# Patient Record
Sex: Male | Born: 1975
Health system: Southern US, Community
[De-identification: ages and names within clinical notes are randomized; demographics above are authoritative.]

## PROBLEM LIST (undated history)

## (undated) DIAGNOSIS — E785 Hyperlipidemia, unspecified: Secondary | ICD-10-CM

## (undated) DIAGNOSIS — T7840XA Allergy, unspecified, initial encounter: Secondary | ICD-10-CM

## (undated) DIAGNOSIS — I10 Essential (primary) hypertension: Secondary | ICD-10-CM

## (undated) DIAGNOSIS — G709 Myoneural disorder, unspecified: Secondary | ICD-10-CM

## (undated) DIAGNOSIS — F32A Depression, unspecified: Secondary | ICD-10-CM

## (undated) DIAGNOSIS — I639 Cerebral infarction, unspecified: Secondary | ICD-10-CM

## (undated) HISTORY — DX: Cerebral infarction, unspecified: I63.9

## (undated) HISTORY — DX: Allergy, unspecified, initial encounter: T78.40XA

## (undated) HISTORY — DX: Essential (primary) hypertension: I10

## (undated) HISTORY — DX: Hyperlipidemia, unspecified: E78.5

## (undated) HISTORY — DX: Myoneural disorder, unspecified: G70.9

---

## 2014-03-01 DIAGNOSIS — M7551 Bursitis of right shoulder: Secondary | ICD-10-CM | POA: Insufficient documentation

## 2014-07-29 DIAGNOSIS — G2589 Other specified extrapyramidal and movement disorders: Secondary | ICD-10-CM | POA: Insufficient documentation

## 2014-12-10 DIAGNOSIS — G8929 Other chronic pain: Secondary | ICD-10-CM | POA: Insufficient documentation

## 2014-12-10 DIAGNOSIS — M25511 Pain in right shoulder: Secondary | ICD-10-CM | POA: Insufficient documentation

## 2019-01-26 DIAGNOSIS — M76891 Other specified enthesopathies of right lower limb, excluding foot: Secondary | ICD-10-CM | POA: Insufficient documentation

## 2019-05-17 ENCOUNTER — Ambulatory Visit: Payer: 59 | Attending: Internal Medicine

## 2019-05-17 DIAGNOSIS — Z23 Encounter for immunization: Secondary | ICD-10-CM

## 2019-05-17 NOTE — Progress Notes (Signed)
   Covid-19 Vaccination Clinic  Name:  Lilbern Slacum    MRN: [id number] DOB: 12/20/1975  05/17/2019  Mr. Loadholt was observed post Covid-19 immunization for 15 minutes without incident. He was provided with Vaccine Information Sheet and instruction to access the V-Safe system.   Mr. Novick was instructed to call 911 with any severe reactions post vaccine: Marland Kitchen Difficulty breathing  . Swelling of face and throat  . A fast heartbeat  . A bad rash all over body  . Dizziness and weakness   Immunizations Administered    Name Date Dose VIS Date Route   Pfizer COVID-19 Vaccine 05/17/2019  8:42 AM 0.3 mL 01/30/2019 Intramuscular   Manufacturer: [place]   Lot: [id number]   [place]: [id number]

## 2019-06-10 ENCOUNTER — Ambulatory Visit: Payer: 59 | Attending: Internal Medicine

## 2019-06-10 DIAGNOSIS — Z23 Encounter for immunization: Secondary | ICD-10-CM

## 2019-06-10 NOTE — Progress Notes (Signed)
   Covid-19 Vaccination Clinic  Name:  Cody Henson    MRN: [id number] DOB: November 26, 1975  06/10/2019  Cody Henson was observed post Covid-19 immunization for 15 minutes without incident. He was provided with Vaccine Information Sheet and instruction to access the V-Safe system.   Cody Henson was instructed to call 911 with any severe reactions post vaccine: Marland Kitchen Difficulty breathing  . Swelling of face and throat  . A fast heartbeat  . A bad rash all over body  . Dizziness and weakness   Immunizations Administered    Name Date Dose VIS Date Route   Pfizer COVID-19 Vaccine 06/10/2019  8:19 AM 0.3 mL 04/15/2018 Intramuscular   Manufacturer: [place]   Lot: [id number]   [place]: [id number]

## 2020-03-16 ENCOUNTER — Emergency Department (HOSPITAL_COMMUNITY): Payer: 59

## 2020-03-16 ENCOUNTER — Encounter (HOSPITAL_COMMUNITY): Payer: Self-pay | Admitting: Emergency Medicine

## 2020-03-16 ENCOUNTER — Inpatient Hospital Stay (HOSPITAL_COMMUNITY)
Admission: EM | Admit: 2020-03-16 | Discharge: 2020-03-23 | DRG: 062 | Disposition: A | Discharge status: Rehabilitation Facility | Payer: 59 | Attending: Neurology | Admitting: Neurology

## 2020-03-16 DIAGNOSIS — R4701 Aphasia: Secondary | ICD-10-CM | POA: Diagnosis not present

## 2020-03-16 DIAGNOSIS — I63412 Cerebral infarction due to embolism of left middle cerebral artery: Secondary | ICD-10-CM | POA: Diagnosis not present

## 2020-03-16 DIAGNOSIS — F1290 Cannabis use, unspecified, uncomplicated: Secondary | ICD-10-CM | POA: Diagnosis present

## 2020-03-16 DIAGNOSIS — R471 Dysarthria and anarthria: Secondary | ICD-10-CM | POA: Diagnosis present

## 2020-03-16 DIAGNOSIS — I639 Cerebral infarction, unspecified: Secondary | ICD-10-CM

## 2020-03-16 DIAGNOSIS — Z79899 Other long term (current) drug therapy: Secondary | ICD-10-CM

## 2020-03-16 DIAGNOSIS — R739 Hyperglycemia, unspecified: Secondary | ICD-10-CM

## 2020-03-16 DIAGNOSIS — R482 Apraxia: Secondary | ICD-10-CM | POA: Diagnosis present

## 2020-03-16 DIAGNOSIS — R030 Elevated blood-pressure reading, without diagnosis of hypertension: Secondary | ICD-10-CM

## 2020-03-16 DIAGNOSIS — E785 Hyperlipidemia, unspecified: Secondary | ICD-10-CM

## 2020-03-16 DIAGNOSIS — G8191 Hemiplegia, unspecified affecting right dominant side: Secondary | ICD-10-CM

## 2020-03-16 DIAGNOSIS — Q211 Atrial septal defect: Secondary | ICD-10-CM

## 2020-03-16 DIAGNOSIS — H53461 Homonymous bilateral field defects, right side: Secondary | ICD-10-CM | POA: Diagnosis present

## 2020-03-16 DIAGNOSIS — R339 Retention of urine, unspecified: Secondary | ICD-10-CM | POA: Diagnosis present

## 2020-03-16 DIAGNOSIS — F32A Depression, unspecified: Secondary | ICD-10-CM | POA: Diagnosis present

## 2020-03-16 DIAGNOSIS — Z8673 Personal history of transient ischemic attack (TIA), and cerebral infarction without residual deficits: Secondary | ICD-10-CM

## 2020-03-16 DIAGNOSIS — R29810 Facial weakness: Secondary | ICD-10-CM | POA: Diagnosis present

## 2020-03-16 DIAGNOSIS — E876 Hypokalemia: Secondary | ICD-10-CM | POA: Diagnosis present

## 2020-03-16 DIAGNOSIS — R29719 NIHSS score 19: Secondary | ICD-10-CM | POA: Diagnosis present

## 2020-03-16 DIAGNOSIS — Z20822 Contact with and (suspected) exposure to covid-19: Secondary | ICD-10-CM | POA: Diagnosis present

## 2020-03-16 DIAGNOSIS — Z823 Family history of stroke: Secondary | ICD-10-CM

## 2020-03-16 DIAGNOSIS — I10 Essential (primary) hypertension: Secondary | ICD-10-CM

## 2020-03-16 HISTORY — DX: Depression, unspecified: F32.A

## 2020-03-16 LAB — DIFFERENTIAL
Abs Immature Granulocytes: 0.09 10*3/uL — ABNORMAL HIGH (ref 0.00–0.07)
Basophils Absolute: 0.1 10*3/uL (ref 0.0–0.1)
Basophils Relative: 0 %
Eosinophils Absolute: 0.1 10*3/uL (ref 0.0–0.5)
Eosinophils Relative: 1 %
Immature Granulocytes: 1 %
Lymphocytes Relative: 12 %
Lymphs Abs: 1.7 10*3/uL (ref 0.7–4.0)
Monocytes Absolute: 0.6 10*3/uL (ref 0.1–1.0)
Monocytes Relative: 4 %
Neutro Abs: 12.4 10*3/uL — ABNORMAL HIGH (ref 1.7–7.7)
Neutrophils Relative %: 82 %

## 2020-03-16 LAB — I-STAT CHEM 8, ED
BUN: 15 mg/dL (ref 6–20)
Calcium, Ion: 1.22 mmol/L (ref 1.15–1.40)
Chloride: 108 mmol/L (ref 98–111)
Creatinine, Ser: 0.9 mg/dL (ref 0.61–1.24)
Glucose, Bld: 109 mg/dL — ABNORMAL HIGH (ref 70–99)
HCT: 46 % (ref 39.0–52.0)
Hemoglobin: 15.6 g/dL (ref 13.0–17.0)
Potassium: 3.4 mmol/L — ABNORMAL LOW (ref 3.5–5.1)
Sodium: 142 mmol/L (ref 135–145)
TCO2: 23 mmol/L (ref 22–32)

## 2020-03-16 LAB — COMPREHENSIVE METABOLIC PANEL
ALT: 34 U/L (ref 0–44)
AST: 33 U/L (ref 15–41)
Albumin: 4.6 g/dL (ref 3.5–5.0)
Alkaline Phosphatase: 72 U/L (ref 38–126)
Anion gap: 14 (ref 5–15)
BUN: 14 mg/dL (ref 6–20)
CO2: 22 mmol/L (ref 22–32)
Calcium: 9.7 mg/dL (ref 8.9–10.3)
Chloride: 105 mmol/L (ref 98–111)
Creatinine, Ser: 1.08 mg/dL (ref 0.61–1.24)
GFR, Estimated: >60 mL/min (ref >60)
Glucose, Bld: 116 mg/dL — ABNORMAL HIGH (ref 70–99)
Potassium: 3.4 mmol/L — ABNORMAL LOW (ref 3.5–5.1)
Sodium: 141 mmol/L (ref 135–145)
Total Bilirubin: 0.6 mg/dL (ref 0.3–1.2)
Total Protein: 7.2 g/dL (ref 6.5–8.1)

## 2020-03-16 LAB — CBC
HCT: 44.6 % (ref 39.0–52.0)
Hemoglobin: 15.6 g/dL (ref 13.0–17.0)
MCH: 30.7 pg (ref 26.0–34.0)
MCHC: 35 g/dL (ref 30.0–36.0)
MCV: 87.8 fL (ref 80.0–100.0)
Platelets: 295 10*3/uL (ref 150–400)
RBC: 5.08 MIL/uL (ref 4.22–5.81)
RDW: 12.8 % (ref 11.5–15.5)
WBC: 15.1 10*3/uL — ABNORMAL HIGH (ref 4.0–10.5)
nRBC: 0 % (ref 0.0–0.2)

## 2020-03-16 LAB — APTT: aPTT: 28 seconds (ref 24–36)

## 2020-03-16 LAB — CBG MONITORING, ED: Glucose-Capillary: 113 mg/dL — ABNORMAL HIGH (ref 70–99)

## 2020-03-16 LAB — PROTIME-INR
INR: 1 (ref 0.8–1.2)
Prothrombin Time: 12.5 seconds (ref 11.4–15.2)

## 2020-03-16 MED ORDER — SODIUM CHLORIDE 0.9 % IV SOLN
50.0000 mL | Freq: Once | INTRAVENOUS | Status: AC
  Administered 2020-03-16: 50 mL via INTRAVENOUS

## 2020-03-16 MED ORDER — IOHEXOL 350 MG/ML SOLN
100.0000 mL | Freq: Once | INTRAVENOUS | Status: AC | PRN
  Administered 2020-03-16: 100 mL via INTRAVENOUS

## 2020-03-16 MED ORDER — SODIUM CHLORIDE 0.9% FLUSH
3.0000 mL | Freq: Once | INTRAVENOUS | Status: AC
  Administered 2020-03-16: 3 mL via INTRAVENOUS

## 2020-03-16 MED ORDER — ALTEPLASE (STROKE) FULL DOSE INFUSION
0.9000 mg/kg | Freq: Once | INTRAVENOUS | Status: AC
  Administered 2020-03-16: 67.6 mg via INTRAVENOUS
  Filled 2020-03-16: qty 67.6

## 2020-03-16 NOTE — H&P (Incomplete)
Referring Physician: Dr. Laverta Baltimore    Chief Complaint: Acute onset of dense expressive and receptive aphasia with right sided weakness  HPI: Cody Henson is an 46 y.o. male presenting with acute onset of dense expressive and receptive aphasia with right sided weakness. The patient apparently has no significant medical history based on EMS brief discussion with his wife. He was LKN at 8:45 PM when wife last interacted with him. 10 minutes later, she saw him crawling up the stairs with right sided weakness. He was globally aphasic at that time. EMS was called. On EMS arrival, he continued to be weak on the right and globally aphasic. Right facial droop was also noted.   Vitals per EMS:  BP 154/90, HR 64, Sats 100% on RA, CBG 131.   Per EMS his wife stated that he takes Lexapro. Pharmacy review of records reveals prescriptions for Adderall and Zoloft.   Wife was not contactable by phone. Review of available records in Epic as well as information provided by EMS reveals no contraindications to tPA, including no bleeding diathesis, head trauma, recent surgery, recent MI or treatment with a blood thinner.    LSN: 8:45 PM tPA Given: Yes NIHSS: 19  No past medical history on file.   No family history on file. Social History:  has no history on file for tobacco use, alcohol use, and drug use.  Allergies: Not on File  Home Medications:  See HPI  ROS: Unable to obtain due to aphasia.   Physical Examination: There were no vitals taken for this visit.  HEENT: [place]/AT Lungs: Respirations unlabored Ext: No edema  Neurologic Examination: Mental Status: Awake and appears alert, but with dense expressive > receptive aphasia. Can follow only some simple commands, such as "smile", "lift up your leg" and "look over here", but unable to comprehend others such as "stick out your tongue". He is completely mute. He demonstrates awareness of being weak on the right. He seems appropriately anxious regarding the  situation.  Cranial Nerves: II:  Blinks to threat in left temporal visual field, but not right. PERRL.  III,IV, VI: No ptosis. Right eye blinks less readily than left. Eyes are conjugate. Able to track examiner horizontally to the left and right.  V,VII: Decreased reactivity to right sided facial stimuli. Prominent weakness of right lower quadrant of face.  VIII: hearing intact to voice IX,X: Unable to assess XI: Unable to assess shoulder shrug XII: Did not protrude tongue to command Motor: RUE flaccid tone, 0/5 to command and noxious RLE flaccid tone, 0/5 to command and noxious LUE 5/5 LLE 5/5 Sensory: Intact to FT on the left. Shakes head no when asked if he can feel stimuli to RUE and RLE Deep Tendon Reflexes:  2+ bilateral brachioradialis and biceps 3+ bilateral patellae, left greater amplitude than right Unable to elicit achilles reflexes Toes mute bilaterally  Cerebellar: No ataxia with FNF on the left Gait: Deferred  Results for orders placed or performed during the hospital encounter of 03/16/20 (from the past 48 hour(s))  CBG monitoring, ED     Status: Abnormal   Collection Time: 03/16/20 10:24 PM  Result Value Ref Range   Glucose-Capillary 113 (H) 70 - 99 mg/dL    Comment: Glucose reference range applies only to samples taken after fasting for at least 8 hours.   No results found.  Assessment: 45 y.o. male presenting with acute onset of right hemiplegia, right facial droop and global aphasia.  1. Exam findings best localize to the  left cerebral hemisphere, MCA territory.  2. CT head with subtle hypodensity in left MCA territory. ASPECTS is 8-9 per verbal discussion with Radiology. No hemorrhage.  3. CTA of head and neck with no LVO per preliminary review of images by Radiology.  4. Stroke Risk Factors - None 5. After comprehensive review of possible contraindications, she has no absolute contraindications to tPA administration. 6. Patient is a tPA candidate. Discussed  extensively the risks/benefits of tPA treatment vs. no treatment with Dr. Laverta Baltimore, including risks of hemorrhage and death with tPA administration versus worse overall outcomes on average in patients within tPA time window who are not administered tPA. The patient's aphasia precludes meaningful medical decision making on her part at this time. Overall benefits of tPA regarding long-term prognosis are felt to outweigh risks. Wife unavailable to obtain informed consent. Two-physician emergent informed consent for tPA obtained after discussion between myself and Dr. Laverta Baltimore. Consent form signed by Dr. Laverta Baltimore and myself.  Plan: 1. Admitting to Neuro ICU under the Neurology service.  2. Post-tPA order se to include frequent neuro checks and BP management.  3. No antiplatelet medications or anticoagulants for at least 24 hours following tPA.  4. DVT prophylaxis with SCDs.  5. Will need to be started on a statin.  6. Will need to start ASA if follow up CT at 24 hours is negative for hemorrhagic conversion. 7. TTE.  8. MRI brain 9. Cardiac telemetry.  10. PT/OT/Speech.  11. NPO until passes swallow evaluation.  12. Fasting lipid panel, HgbA1c 13. Start atorvastatin 40 mg po qd  50 minutes spent in the emergent neurological evaluation and management of this critically ill patient  @Electronically  signed: Dr. Kerney Elbe  03/16/2020, 10:28 PM

## 2020-03-16 NOTE — ED Provider Notes (Signed)
[facility] EMERGENCY DEPARTMENT Provider Note   CSN: [id number] Arrival date & time: 03/16/20  2219     History No chief complaint on file.   Cody Henson is a 45 y.o. male.  Patient with reported PMH of HTN and family hx of stroke presents to the ED with a chief complaint of right sided facial droop, extremity weakness and expressive aphasia.  Last known normal at 2045 tonight. History difficult to elicit 2/2 aphasia.  Level 5 caveat applies.  The history is provided by the EMS personnel. No language interpreter was used.       No past medical history on file.  There are no problems to display for this patient.   History reviewed. No pertinent surgical history.     No family history on file.     Home Medications Prior to Admission medications   Not on File    Allergies    Patient has no allergy information on record.  Review of Systems   Review of Systems  Unable to perform ROS: Patient nonverbal    Physical Exam Updated Vital Signs There were no vitals taken for this visit.  Physical Exam Vitals and nursing note reviewed.  Constitutional:      Appearance: He is well-developed and well-nourished.  HENT:     Head: Normocephalic and atraumatic.  Eyes:     Conjunctiva/sclera: Conjunctivae normal.  Cardiovascular:     Rate and Rhythm: Normal rate and regular rhythm.     Heart sounds: No murmur heard.   Pulmonary:     Effort: Pulmonary effort is normal. No respiratory distress.     Breath sounds: Normal breath sounds.  Abdominal:     Palpations: Abdomen is soft.     Tenderness: There is no abdominal tenderness.  Musculoskeletal:        General: No edema.     Cervical back: Neck supple.  Skin:    General: Skin is warm and dry.  Neurological:     Mental Status: He is alert.     Comments: Right sided facial droop Decreased sensation of right face RUE and RLE flaccid Good strength in LUE and LLE Following commands   Psychiatric:        Mood and Affect: Mood and affect normal.     Comments: Frustrated/concerned     ED Results / Procedures / Treatments   Labs (all labs ordered are listed, but only abnormal results are displayed) Labs Reviewed  CBG MONITORING, ED - Abnormal; Notable for the following components:      Result Value   Glucose-Capillary 113 (*)    All other components within normal limits  PROTIME-INR  APTT  CBC  DIFFERENTIAL  COMPREHENSIVE METABOLIC PANEL  I-STAT CHEM 8, ED    EKG EKG Interpretation  Date/Time:  Wednesday March 16 2020 22:46:49 EST Ventricular Rate:  70 PR Interval:    QRS Duration: 103 QT Interval:  393 QTC Calculation: 424 R Axis:   22 Text Interpretation: Sinus rhythm no stemi Confirmed by Nanda Quinton [phone]) on 03/16/2020 10:49:20 PM   Radiology CT ANGIO NECK W OR WO CONTRAST  Result Date: 03/16/2020 CLINICAL DATA:  Initial evaluation for acute stroke, receptive and expressive aphasia. EXAM: CT ANGIOGRAPHY HEAD AND NECK TECHNIQUE: Multidetector CT imaging of the head and neck was performed using the standard protocol during bolus administration of intravenous contrast. Multiplanar CT image reconstructions and MIPs were obtained to evaluate the vascular anatomy. Carotid stenosis measurements (when applicable) are obtained  utilizing NASCET criteria, using the distal internal carotid diameter as the denominator. CONTRAST:  151mL OMNIPAQUE IOHEXOL 350 MG/ML SOLN COMPARISON:  Prior head CT from earlier the same day. FINDINGS: CTA NECK FINDINGS Aortic arch: Visualized aortic arch of normal caliber with normal branch pattern. No hemodynamically significant stenosis seen about the origin of the great vessels. Right carotid system: Right common and internal carotid arteries patent without stenosis, dissection or occlusion. Left carotid system: Left common and internal carotid arteries patent without stenosis, dissection or occlusion. Vertebral arteries: Both  vertebral arteries arise from the subclavian arteries. Vertebral arteries widely patent without stenosis, dissection or occlusion. Skeleton: No visible acute osseous abnormality. No discrete or worrisome osseous lesions. Other neck: No other acute soft tissue abnormality within the neck. No mass or adenopathy. Upper chest: Small layering right pleural effusion noted. Partially visualized lungs and upper chest demonstrate no other acute finding. Review of the MIP images confirms the above findings CTA HEAD FINDINGS Anterior circulation: Both internal carotid arteries patent to the termini without stenosis. A1 segments patent bilaterally. Normal anterior communicating artery complex. Anterior cerebral arteries patent to their distal aspects without stenosis. No M1 stenosis or occlusion. Normal MCA bifurcations. No proximal MCA branch occlusion. Mild irregularity of distal left MCA branches noted, which could be related to recent recanalization. No visible proximal branch occlusion. Posterior circulation: Both V4 segments patent to the vertebrobasilar junction without stenosis. Both PICA origins patent and normal. Basilar patent to its distal aspect without stenosis. Superior cerebellar arteries patent bilaterally. Right PCA supplied via the basilar. Predominant fetal type origin of the left PCA. Both PCAs well perfused to their distal aspects without stenosis. Venous sinuses: Patent. Anatomic variants: Fetal type origin left PCA.  No aneurysm. Review of the MIP images confirms the above findings IMPRESSION: 1. Negative CTA for large vessel occlusion. No high-grade or correctable stenosis identified. 2. Mild irregularity of distal left MCA branches, which could be related to recent recanalization. 3. Small layering right pleural effusion. Critical Value/emergent results were called by telephone at the time of interpretation on 03/16/2020 at 10:55 pm to provider ERIC [facility] , who verbally acknowledged these results.  Electronically Signed   By: Jeannine Boga M.D.   On: 03/16/2020 23:23   CT HEAD CODE STROKE WO CONTRAST  Result Date: 03/16/2020 CLINICAL DATA:  Code stroke. Initial evaluation for acute neuro deficit, stroke suspected. EXAM: CT HEAD WITHOUT CONTRAST TECHNIQUE: Contiguous axial images were obtained from the base of the skull through the vertex without intravenous contrast. COMPARISON:  None. FINDINGS: Brain: Cerebral volume within normal limits for patient age. No acute intracranial hemorrhage. There is subtle hypodensity involving the mid and posterior left frontal cortex with involvement of the precentral gyrus,, suspicious for evolving acute left MCA distribution infarct (series 2, image 25). No other definite large vessel territory infarct. No mass lesion, midline shift or mass effect. No hydrocephalus or extra-axial fluid collection. Vascular: No visible hyperdense vessel. Skull: Scalp soft tissues and calvarium within normal limits. Sinuses/Orbits: Globes and orbital soft tissues within normal limits. Paranasal sinuses are clear. No mastoid effusion. Other: None. ASPECTS [facility] Stroke Program Early CT Score) - Ganglionic level infarction (caudate, lentiform nuclei, internal capsule, insula, M1-M3 cortex): 7 - Supraganglionic infarction (M4-M6 cortex): 1 Total score (0-10 with 10 being normal): 8 IMPRESSION: 1. Subtle evolving hypodensity involving the supra ganglionic mid and posterior left frontal lobe, concerning for acute left MCA distribution infarct. No intracranial hemorrhage. 2. ASPECTS is 8. Critical Value/emergent results were called  by telephone at the time of interpretation on 03/16/2020 at 10:45 pm to provider Dr. Cheral Marker, who verbally acknowledged these results. Electronically Signed   By: Jeannine Boga M.D.   On: 03/16/2020 23:00   CT ANGIO HEAD CODE STROKE  Result Date: 03/16/2020 CLINICAL DATA:  Initial evaluation for acute stroke, receptive and expressive aphasia. EXAM:  CT ANGIOGRAPHY HEAD AND NECK TECHNIQUE: Multidetector CT imaging of the head and neck was performed using the standard protocol during bolus administration of intravenous contrast. Multiplanar CT image reconstructions and MIPs were obtained to evaluate the vascular anatomy. Carotid stenosis measurements (when applicable) are obtained utilizing NASCET criteria, using the distal internal carotid diameter as the denominator. CONTRAST:  12mL OMNIPAQUE IOHEXOL 350 MG/ML SOLN COMPARISON:  Prior head CT from earlier the same day. FINDINGS: CTA NECK FINDINGS Aortic arch: Visualized aortic arch of normal caliber with normal branch pattern. No hemodynamically significant stenosis seen about the origin of the great vessels. Right carotid system: Right common and internal carotid arteries patent without stenosis, dissection or occlusion. Left carotid system: Left common and internal carotid arteries patent without stenosis, dissection or occlusion. Vertebral arteries: Both vertebral arteries arise from the subclavian arteries. Vertebral arteries widely patent without stenosis, dissection or occlusion. Skeleton: No visible acute osseous abnormality. No discrete or worrisome osseous lesions. Other neck: No other acute soft tissue abnormality within the neck. No mass or adenopathy. Upper chest: Small layering right pleural effusion noted. Partially visualized lungs and upper chest demonstrate no other acute finding. Review of the MIP images confirms the above findings CTA HEAD FINDINGS Anterior circulation: Both internal carotid arteries patent to the termini without stenosis. A1 segments patent bilaterally. Normal anterior communicating artery complex. Anterior cerebral arteries patent to their distal aspects without stenosis. No M1 stenosis or occlusion. Normal MCA bifurcations. No proximal MCA branch occlusion. Mild irregularity of distal left MCA branches noted, which could be related to recent recanalization. No visible  proximal branch occlusion. Posterior circulation: Both V4 segments patent to the vertebrobasilar junction without stenosis. Both PICA origins patent and normal. Basilar patent to its distal aspect without stenosis. Superior cerebellar arteries patent bilaterally. Right PCA supplied via the basilar. Predominant fetal type origin of the left PCA. Both PCAs well perfused to their distal aspects without stenosis. Venous sinuses: Patent. Anatomic variants: Fetal type origin left PCA.  No aneurysm. Review of the MIP images confirms the above findings IMPRESSION: 1. Negative CTA for large vessel occlusion. No high-grade or correctable stenosis identified. 2. Mild irregularity of distal left MCA branches, which could be related to recent recanalization. 3. Small layering right pleural effusion. Critical Value/emergent results were called by telephone at the time of interpretation on 03/16/2020 at 10:55 pm to provider ERIC [facility] , who verbally acknowledged these results. Electronically Signed   By: Jeannine Boga M.D.   On: 03/16/2020 23:23    Procedures .Critical Care Performed by: Montine Circle, PA-C Authorized by: Montine Circle, PA-C   Critical care provider statement:    Critical care time (minutes):  35   Critical care was necessary to treat or prevent imminent or life-threatening deterioration of the following conditions:  CNS failure or compromise   Critical care was time spent personally by me on the following activities:  Discussions with consultants, evaluation of patient's response to treatment, examination of patient, ordering and performing treatments and interventions, ordering and review of laboratory studies, ordering and review of radiographic studies, pulse oximetry, re-evaluation of patient's condition, obtaining history from patient or surrogate  and review of old charts     Medications Ordered in ED Medications  sodium chloride flush (NS) 0.9 % injection 3 mL (has no  administration in time range)    ED Course  I have reviewed the triage vital signs and the nursing notes.  Pertinent labs & imaging results that were available during my care of the patient were reviewed by me and considered in my medical decision making (see chart for details).    MDM Rules/Calculators/A&P                          Patient here with stroke like symptoms.  He is from home with right sided weakness and expressive aphasia and right sided facial droop.   10:35 PM No CT evidence of hemorrhage.  Decision made by Dr. Cheral Marker and Dr. Laverta Baltimore to administer tPA.   LKW 2045.     Labs are notable for mild hypokalemia.  CBG normal.  Leukocytosis to 15.  CTA shows no evidence of LVO.    11:31 PM Has attempted to say a few words, but mostly unsuccessful.   Consults: Appreciate Dr. Cheral Marker - Neurology, who will admit.   Plan: Admit to neuro ICU  Final Clinical Impression(s) / ED Diagnoses Final diagnoses:  Acute ischemic stroke [facility])  Aphasia    Rx / DC Orders ED Discharge Orders    None       Montine Circle, PA-C 03/16/20 2332    Margette Fast, MD 03/18/20 1150

## 2020-03-16 NOTE — ED Triage Notes (Signed)
Pt bib [facility] ems with right sided weakness, right sided facial droop, and expressive aphasia. LVO 5. [place] 2045.   EMS:  BP:159/99 HR: 64 Spo2: 100% RA CBG: 131

## 2020-03-17 ENCOUNTER — Inpatient Hospital Stay (HOSPITAL_COMMUNITY): Payer: 59

## 2020-03-17 DIAGNOSIS — F32A Depression, unspecified: Secondary | ICD-10-CM | POA: Diagnosis not present

## 2020-03-17 DIAGNOSIS — Z7982 Long term (current) use of aspirin: Secondary | ICD-10-CM | POA: Diagnosis not present

## 2020-03-17 DIAGNOSIS — R739 Hyperglycemia, unspecified: Secondary | ICD-10-CM | POA: Diagnosis not present

## 2020-03-17 DIAGNOSIS — I69390 Apraxia following cerebral infarction: Secondary | ICD-10-CM | POA: Diagnosis not present

## 2020-03-17 DIAGNOSIS — F1210 Cannabis abuse, uncomplicated: Secondary | ICD-10-CM

## 2020-03-17 DIAGNOSIS — I69351 Hemiplegia and hemiparesis following cerebral infarction affecting right dominant side: Secondary | ICD-10-CM | POA: Diagnosis not present

## 2020-03-17 DIAGNOSIS — Z23 Encounter for immunization: Secondary | ICD-10-CM | POA: Diagnosis not present

## 2020-03-17 DIAGNOSIS — Z79899 Other long term (current) drug therapy: Secondary | ICD-10-CM | POA: Diagnosis not present

## 2020-03-17 DIAGNOSIS — I639 Cerebral infarction, unspecified: Secondary | ICD-10-CM | POA: Diagnosis not present

## 2020-03-17 DIAGNOSIS — Z8673 Personal history of transient ischemic attack (TIA), and cerebral infarction without residual deficits: Secondary | ICD-10-CM | POA: Diagnosis not present

## 2020-03-17 DIAGNOSIS — Z20822 Contact with and (suspected) exposure to covid-19: Secondary | ICD-10-CM | POA: Diagnosis present

## 2020-03-17 DIAGNOSIS — E782 Mixed hyperlipidemia: Secondary | ICD-10-CM | POA: Diagnosis not present

## 2020-03-17 DIAGNOSIS — G8191 Hemiplegia, unspecified affecting right dominant side: Secondary | ICD-10-CM | POA: Diagnosis not present

## 2020-03-17 DIAGNOSIS — I69393 Ataxia following cerebral infarction: Secondary | ICD-10-CM | POA: Diagnosis not present

## 2020-03-17 DIAGNOSIS — Z823 Family history of stroke: Secondary | ICD-10-CM | POA: Diagnosis not present

## 2020-03-17 DIAGNOSIS — E876 Hypokalemia: Secondary | ICD-10-CM | POA: Diagnosis present

## 2020-03-17 DIAGNOSIS — R7401 Elevation of levels of liver transaminase levels: Secondary | ICD-10-CM | POA: Diagnosis not present

## 2020-03-17 DIAGNOSIS — R030 Elevated blood-pressure reading, without diagnosis of hypertension: Secondary | ICD-10-CM | POA: Diagnosis not present

## 2020-03-17 DIAGNOSIS — I69320 Aphasia following cerebral infarction: Secondary | ICD-10-CM | POA: Diagnosis not present

## 2020-03-17 DIAGNOSIS — Z7902 Long term (current) use of antithrombotics/antiplatelets: Secondary | ICD-10-CM | POA: Diagnosis not present

## 2020-03-17 DIAGNOSIS — Z91012 Allergy to eggs: Secondary | ICD-10-CM | POA: Diagnosis not present

## 2020-03-17 DIAGNOSIS — R4701 Aphasia: Secondary | ICD-10-CM | POA: Diagnosis not present

## 2020-03-17 DIAGNOSIS — H53461 Homonymous bilateral field defects, right side: Secondary | ICD-10-CM | POA: Diagnosis present

## 2020-03-17 DIAGNOSIS — I10 Essential (primary) hypertension: Secondary | ICD-10-CM | POA: Diagnosis not present

## 2020-03-17 DIAGNOSIS — R471 Dysarthria and anarthria: Secondary | ICD-10-CM | POA: Diagnosis present

## 2020-03-17 DIAGNOSIS — I63512 Cerebral infarction due to unspecified occlusion or stenosis of left middle cerebral artery: Secondary | ICD-10-CM | POA: Diagnosis not present

## 2020-03-17 DIAGNOSIS — R482 Apraxia: Secondary | ICD-10-CM | POA: Diagnosis present

## 2020-03-17 DIAGNOSIS — E785 Hyperlipidemia, unspecified: Secondary | ICD-10-CM | POA: Diagnosis not present

## 2020-03-17 DIAGNOSIS — I6389 Other cerebral infarction: Secondary | ICD-10-CM | POA: Diagnosis not present

## 2020-03-17 DIAGNOSIS — R339 Retention of urine, unspecified: Secondary | ICD-10-CM | POA: Diagnosis not present

## 2020-03-17 DIAGNOSIS — R29719 NIHSS score 19: Secondary | ICD-10-CM | POA: Diagnosis present

## 2020-03-17 DIAGNOSIS — I69319 Unspecified symptoms and signs involving cognitive functions following cerebral infarction: Secondary | ICD-10-CM | POA: Diagnosis not present

## 2020-03-17 DIAGNOSIS — R29810 Facial weakness: Secondary | ICD-10-CM | POA: Diagnosis present

## 2020-03-17 DIAGNOSIS — I63412 Cerebral infarction due to embolism of left middle cerebral artery: Principal | ICD-10-CM

## 2020-03-17 DIAGNOSIS — F1290 Cannabis use, unspecified, uncomplicated: Secondary | ICD-10-CM | POA: Diagnosis present

## 2020-03-17 DIAGNOSIS — I69359 Hemiplegia and hemiparesis following cerebral infarction affecting unspecified side: Secondary | ICD-10-CM | POA: Diagnosis not present

## 2020-03-17 DIAGNOSIS — Q211 Atrial septal defect: Secondary | ICD-10-CM | POA: Diagnosis not present

## 2020-03-17 DIAGNOSIS — I69322 Dysarthria following cerebral infarction: Secondary | ICD-10-CM | POA: Diagnosis not present

## 2020-03-17 LAB — LIPID PANEL
Cholesterol: 212 mg/dL — ABNORMAL HIGH (ref 0–200)
HDL: 40 mg/dL — ABNORMAL LOW (ref >40)
LDL Cholesterol: 149 mg/dL — ABNORMAL HIGH (ref 0–99)
Total CHOL/HDL Ratio: 5.3 RATIO
Triglycerides: 113 mg/dL (ref <150)
VLDL: 23 mg/dL (ref 0–40)

## 2020-03-17 LAB — RAPID URINE DRUG SCREEN, HOSP PERFORMED
Amphetamines: NOT DETECTED
Barbiturates: NOT DETECTED
Benzodiazepines: NOT DETECTED
Cocaine: NOT DETECTED
Opiates: NOT DETECTED
Tetrahydrocannabinol: POSITIVE — AB

## 2020-03-17 LAB — ECHOCARDIOGRAM COMPLETE
Area-P 1/2: 4.89 cm2
Calc EF: 63 %
S' Lateral: 2 cm
Single Plane A2C EF: 66.5 %
Single Plane A4C EF: 61 %
Weight: 2649.05 oz

## 2020-03-17 LAB — GLUCOSE, CAPILLARY
Glucose-Capillary: 98 mg/dL (ref 70–99)
Glucose-Capillary: 116 mg/dL — ABNORMAL HIGH (ref 70–99)
Glucose-Capillary: 105 mg/dL — ABNORMAL HIGH (ref 70–99)

## 2020-03-17 LAB — HEMOGLOBIN A1C
Hgb A1c MFr Bld: 5.1 % (ref 4.8–5.6)
Mean Plasma Glucose: 99.67 mg/dL

## 2020-03-17 LAB — HIV ANTIBODY (ROUTINE TESTING W REFLEX): HIV Screen 4th Generation wRfx: NONREACTIVE

## 2020-03-17 LAB — SARS CORONAVIRUS 2 BY RT PCR (HOSPITAL ORDER, PERFORMED IN ~~LOC~~ HOSPITAL LAB): SARS Coronavirus 2: NEGATIVE

## 2020-03-17 LAB — MRSA PCR SCREENING: MRSA by PCR: NEGATIVE

## 2020-03-17 MED ORDER — SENNOSIDES-DOCUSATE SODIUM 8.6-50 MG PO TABS
1.0000 | ORAL_TABLET | Freq: Every evening | ORAL | Status: DC | PRN
  Administered 2020-03-21: 1 via ORAL
  Filled 2020-03-17: qty 1

## 2020-03-17 MED ORDER — PANTOPRAZOLE SODIUM 40 MG PO TBEC
40.0000 mg | DELAYED_RELEASE_TABLET | Freq: Every day | ORAL | Status: DC
  Administered 2020-03-17 – 2020-03-23 (×7): 40 mg via ORAL
  Filled 2020-03-17 (×7): qty 1

## 2020-03-17 MED ORDER — PANTOPRAZOLE SODIUM 40 MG IV SOLR
40.0000 mg | Freq: Every day | INTRAVENOUS | Status: DC
  Administered 2020-03-17: 40 mg via INTRAVENOUS
  Filled 2020-03-17: qty 40

## 2020-03-17 MED ORDER — STROKE: EARLY STAGES OF RECOVERY BOOK
Freq: Once | Status: AC
  Filled 2020-03-17: qty 1

## 2020-03-17 MED ORDER — ACETAMINOPHEN 650 MG RE SUPP: 650.0000 mg | RECTAL | Status: DC | PRN

## 2020-03-17 MED ORDER — ACETAMINOPHEN 160 MG/5ML PO SOLN: 650.0000 mg | ORAL | Status: DC | PRN

## 2020-03-17 MED ORDER — ATORVASTATIN CALCIUM 80 MG PO TABS
80.0000 mg | ORAL_TABLET | Freq: Every day | ORAL | Status: DC
  Administered 2020-03-17 – 2020-03-23 (×7): 80 mg via ORAL
  Filled 2020-03-17 (×7): qty 1

## 2020-03-17 MED ORDER — CHLORHEXIDINE GLUCONATE CLOTH 2 % EX PADS
6.0000 | MEDICATED_PAD | Freq: Every day | CUTANEOUS | Status: DC
  Administered 2020-03-17 – 2020-03-23 (×6): 6 via TOPICAL

## 2020-03-17 MED ORDER — CLEVIDIPINE BUTYRATE 0.5 MG/ML IV EMUL
0.0000 mg/h | INTRAVENOUS | Status: DC
  Filled 2020-03-17: qty 50

## 2020-03-17 MED ORDER — ACETAMINOPHEN 325 MG PO TABS: 650.0000 mg | ORAL_TABLET | ORAL | Status: DC | PRN

## 2020-03-17 MED ORDER — SODIUM CHLORIDE 0.9 % IV SOLN: INTRAVENOUS | Status: AC

## 2020-03-17 MED ORDER — LABETALOL HCL 5 MG/ML IV SOLN: 5.0000 mg | INTRAVENOUS | Status: DC | PRN

## 2020-03-17 NOTE — Progress Notes (Addendum)
STROKE TEAM PROGRESS NOTE   INTERVAL HISTORY No acute overnight events. Patient evaluated at bedside this morning, no family present in the room.  When entered room to examine the patient, he had urgency to go to the bathroom but was unable to so helped by RN in and out catheter.  Patient was alert, awake, had expressive aphasia but able to make some sounds, not necessarily meaningful.  Able to follow midline commands but not peripheral or two-step commands.  Patient passed his swallowing testing and is on regular thin liquid diet now.  Hypercoagulable panel is ordered for tomorrow morning and TEE will be scheduled for coming Monday.  TCD with bubble study will be scheduled for tomorrow morning.  Blood pressure well controlled.  Please see below for the details of neurological exam.  Of note: Called patient's wife via phone and updated about the patient's current situation and answered all questions appropriately.  She mentioned that patient only take 1 medication at home for depression.  She will update [place] tomorrow after checking the name of the medication.  Vitals:   03/17/20 0630 03/17/20 0645 03/17/20 0700 03/17/20 0800  BP: 115/85 (!) 116/101 126/85 (!) 121/96  Pulse: (!) 52 (!) 52 82 (!) 53  Resp: 18 17 (!) 23 17  Temp:    99.5 F (37.5 C)  TempSrc:    Oral  SpO2: 97% 96% 98% 96%  Weight:       CBC:  Recent Labs  Lab 03/16/20 2220 03/16/20 2233  WBC 15.1*  --   NEUTROABS 12.4*  --   HGB 15.6 15.6  HCT 44.6 46.0  MCV 87.8  --   PLT 295  --    Basic Metabolic Panel:  Recent Labs  Lab 03/16/20 2220 03/16/20 2233  NA 141 142  K 3.4* 3.4*  CL 105 108  CO2 22  --   GLUCOSE 116* 109*  BUN 14 15  CREATININE 1.08 0.90  CALCIUM 9.7  --    Lipid Panel:  Recent Labs  Lab 03/17/20 0344  CHOL 212*  TRIG 113  HDL 40*  CHOLHDL 5.3  VLDL 23  LDLCALC 149*   HgbA1c:  Recent Labs  Lab 03/17/20 0344  HGBA1C 5.1   Urine Drug Screen: Pending  IMAGING past 24 hours  CT  Head code stroke wo contrast 03/16/2020  IMPRESSION: 1. Subtle evolving hypodensity involving the supra ganglionic mid and posterior left frontal lobe, concerning for acute left MCA distribution infarct. No intracranial hemorrhage. 2. ASPECTS is 8.  CT Angio Head Neck w or wo contrast 03/16/2020  IMPRESSION: 1. Negative CTA for large vessel occlusion. No high-grade or correctable stenosis identified. 2. Mild irregularity of distal left MCA branches, which could be related to recent recanalization. 3. Small layering right pleural effusion.  MR Brain wo contrast 03/17/2020  Patchy multifocal areas of restricted diffusion seen involving the cortical and subcortical aspect of the left frontal and parietal lobes as well as the left temporal occipital region, consistent with acute ischemic infarcts, left MCA distribution. Associated mild scattered petechial hemorrhage without frank hemorrhagic transformation. No significant regional mass effect.  VAS [place] Lower extremity 03/17/2020  Summary:  BILATERAL:  - No evidence of deep vein thrombosis seen in the lower extremities,  bilaterally.  -No evidence of popliteal cyst, bilaterally.   PHYSICAL EXAM  General: Well-developed, well-nourished Caucasian male sitting comfortably in bed, NAD HEENT: [place]/AT, MMM, PERRLA Cardiovascular: Regular rate and rhythm Respiratory: No respiratory distress Abdomen: Soft, nondistended, no tenderness  or rigidity Psych: Normal mood and affect Neurological:  Mental Status: Alert, awake, unable to assess orientation.  Expressive aphasia, not able to name, repeat, makes an meaningful sounds.  Able to follow midline commands but not peripheral commands or two-step commands. Cranial Nerves: II:  V right hemianopia, pupils equal, round, reactive to light and accommodation III,IV, VI: ptosis not present, extra-ocular motions intact bilaterally V,VII: smile asymmetric, right nasolabial fold flattening facial  light touch sensation normal bilaterally VIII: hearing normal bilaterally IX,X: uvula rises symmetrically XI: bilateral shoulder shrug XII: midline tongue extension without atrophy or fasciculations  Motor: Right : Upper extremity   3/5 with drift   left:     Upper extremity   5/5  Lower extremity   3/5     Lower extremity   5/5 Tone and bulk:normal tone throughout; no atrophy noted Sensory: Assess Deep Tendon Reflexes:  Right: Upper Extremity   Left: Upper extremity   biceps (C-5 to C-6) 2/4   biceps (C-5 to C-6) 2/4 tricep (C7) 2/4    triceps (C7) 2/4 Brachioradialis (C6) 2/4  Brachioradialis (C6) 2/4  Lower Extremity Lower Extremity  quadriceps (L-2 to L-4) 2/4   quadriceps (L-2 to L-4) 2/4 Achilles (S1) 2/4   Achilles (S1) 2/4  Plantars: Right: downgoing   Left: downgoing Cerebellar: Unable to access. Gait: Deferred   ASSESSMENT/PLAN Mr. Deakin Lacek is a 45 y.o. male with unknown past medical history presented with acute onset of dense expressive and receptive aphasia with right-sided weakness.  CT head with subtle hypodensity in left MCA territory.  CTA of head and neck with no large vessel occlusion.  Patient received TPA at 10:40 PM on 03/16/2020.  MRA head shows patchy multifocal area of restricted diffusion seen involving the cortical and subcortical aspect of left frontal and parietal lobes as well as left temporal occipital region, consistent with acute ischemic infarct, left MCA distribution.  Stroke: Acute ischemic infarct in left MCA distribution s/p tPA likely embolic in origin, unclear source at this point.  Code Stroke CT head: Subtle evolving hypodensity involving the supra ganglionic mid and posterior left frontal lobe, concerning for acute left MCA distribution infarct. ASPECTS 8.  CTA head & neck:  Negative CTA for large vessel occlusion. Mild irregularity of distal left MCA branches, which could be related to recent recanalization.  MRI: Patchy multifocal  areas of restricted diffusion seen involving the cortical and subcortical aspect of the left frontal and parietal lobes as well as the left temporal occipital region, consistent with acute ischemic infarcts, left MCA distribution  Doppler US Lower extremities: No evidence of deep vein thrombosis  2D Echo: Pending  TEE pending  TCD bubble study pending  LDL 149  HgbA1c 5.1  Hypercoagulable and autoimmune work up pending  VTE prophylaxis - SCD's  No antithrombotic prior to admission, now on No antithrombotic within 24h of tPA  Therapy recommendations: CIR  Disposition: Pending  Hypertension  Home meds: None  Stable . SBP< 180/105 after tPA  . Long-term BP goal normotensive  Hyperlipidemia  Home meds: None  LDL 149, goal < 70  Start lipitor 80  Continue statin at discharge  Other Stroke Risk Factors  F/Hx of Stroke maternal grandfather and maternal aunts and uncles.  UDS + for [facility]  Other Active Problems  Depression  Urinary retention s/p in and out  Hospital day # 0  ATTENDING NOTE: I reviewed above note and agree with the assessment and plan. Pt was seen and examined.  45 year old male with no known past medical history admitted for aphasia and right-sided weakness.  Status post TPA.  CT head concerning for subtle left MCA infarct.  CTA head neck showed no LVO.  MRI showed left MCA scattered moderate large infarct.  2D echo pending, LE venous Doppler no DVT.  LDL 149 A1c 5.1.  UDS positive for THC.  RN at the bedside.  Patient had urinary retention, status post in and out for 1 L urine output.  Pt awake alert, expressive aphasia, not able to name or repeat, able to make sounds but not meaningful. Able to follow midline commands but not peripheral commands, but able to pantomime. No gaze palsy, tracking bilaterally, however, he seems to have right hemianopia. Right nasolabial fold flattening. Tongue midline. Left UE and LE 5/5, right UE 3/5 with drift, right  LE 3/5 proximally with prompt but 0/5 distally. Sensation and coordination not cooperative, gait not tested.   Etiology for patient stroke not clear.  No need of further stroke work-up including TEE, TCD bubble study, hypercoagulable and autoimmune work-up.  No antiplatelet now as within 24 hours TPA.  Add Lipitor 80 mg for HLD management.  PT/OT recommend CIR.  Rosalin Hawking, MD PhD Stroke Neurology 03/17/2020 7:39 PM  This patient is critically ill due to large left MCA infarct status post TPA and at significant risk of neurological worsening, death form recurrent stroke, hemorrhagic conversion, hemorrhage from TPA, seizure. This patient's care requires constant monitoring of vital signs, hemodynamics, respiratory and cardiac monitoring, review of multiple databases, neurological assessment, discussion with family, other specialists and medical decision making of high complexity. I spent 35 minutes of neurocritical care time in the care of this patient.   To contact Stroke Continuity provider, please refer to [email or web address] After hours, contact General Neurology

## 2020-03-17 NOTE — Plan of Care (Signed)

## 2020-03-17 NOTE — Evaluation (Signed)
Occupational Therapy Evaluation Patient Details Name: Cody Henson MRN: [id number] DOB: [birth year] Today's Date: 03/17/2020    History of Present Illness Cody Henson is an 45 y.o. male presenting with acute onset of dense expressive and receptive aphasia with right sided weakness. MRI . Patchy multifocal acute ischemic infarcts involving the left cerebral hemisphere.  Associated mild scattered petechial hemorrhage without frank hemorrhagic transformation. No significant PMH.   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 Today Pt presents with expressive difficulties, but does well responding "yes" and "no" to questions. R sided deficits in proprioception, sensation, and strength. When he focuses his attention on moving the RUE he has better success with functional outcomes then when his attention is divided during functional transfers. He is currently overall mod A for ADL - be he was able tod on sock LLE. He is mod A +2 for bed mobility and transfers requiring support for RUE and blocking of the RLE to prevent buckling. Pt will make EXCELLENT CIR candidate as Pt will require intensive comprehensive rehabilitation to maximize safety and independence in ADL and functional transfers. Good support from wife. Next session establish HEP for RUE and OOB.     Follow Up Recommendations  CIR    Equipment Recommendations  3 in 1 bedside commode;Other (comment) (defer to next venue of care)    Recommendations for Other Services Rehab consult     Precautions / Restrictions Precautions Precautions: Fall Restrictions Weight Bearing Restrictions: No      Mobility Bed Mobility Overal bed mobility: Needs Assistance Bed Mobility: Supine to Sit;Sit to Supine     Supine to sit: Mod assist;+2 for safety/equipment;HOB elevated (assist to bring RLE to  EOB) Sit to supine: Mod assist;+2 for safety/equipment (assist to manage RUE (to prevent flopping around) and RLE back into bed)   General bed mobility comments: Pt requires assist to manage R side of body and lines    Transfers Overall transfer level: Needs assistance Equipment used: 2 person hand held assist Transfers: Sit to/from Stand Sit to Stand: Mod assist;+2 physical assistance;+2 safety/equipment;From elevated surface (ICU bed)         General transfer comment: RLE requires blocking to prevent buckling and RUE requires support as it cannot currently grasp objects during standing tasks    Balance Overall balance assessment: Needs assistance Sitting-balance support: Single extremity supported;Feet supported Sitting balance-Leahy Scale: Fair Sitting balance - Comments: can sit EOB at supervision level, can take light challenge   Standing balance support: Single extremity supported Standing balance-Leahy Scale: Poor Standing balance comment: dependent on external support                           ADL either performed or assessed with clinical judgement   ADL Overall ADL's : Needs assistance/impaired Eating/Feeding: Moderate assistance   Grooming: Wash/dry hands;Wash/dry face;Moderate assistance;Sitting Grooming Details (indicate cue type and reason): increased time, mod A for assist with RUE and cues how to use LUE to assist RUE Upper Body Bathing: Moderate assistance;Sitting   Lower Body Bathing: Moderate assistance;Sit to/from stand Lower Body Bathing Details (indicate cue type and reason): can perform figure 4 on L but not R Upper Body Dressing : Moderate assistance;Sitting   Lower Body Dressing: Moderate assistance Lower Body Dressing Details (indicate cue type and reason): con don  sock on L foot but not right, did attempt to engage RUE with cues Toilet Transfer: Moderate assistance;+2 for physical assistance;+2 for safety/equipment Toilet Transfer  Details (indicate cue type and reason): 2 person HHA. blocking for LLE and support for RUE Toileting- Clothing Manipulation and Hygiene: Maximal assistance       Functional mobility during ADLs: Moderate assistance;+2 for physical assistance;+2 for safety/equipment (2 person HHA)       Vision Baseline Vision/History: No visual deficits Patient Visual Report: No change from baseline Vision Assessment?: No apparent visual deficits     Perception     Praxis      Pertinent Vitals/Pain Pain Assessment: Faces Faces Pain Scale: Hurts a little bit Pain Location: generalized Pain Descriptors / Indicators: Discomfort Pain Intervention(s): Monitored during session;Repositioned     Hand Dominance Right   Extremity/Trunk Assessment Upper Extremity Assessment Upper Extremity Assessment: RUE deficits/detail RUE Deficits / Details: decreased proprioception and strength (shoulder and elbow 3/5 hand and wrist 2/5) decreased sensation (feels like he's got a wet suit on) RUE Sensation: decreased light touch;decreased proprioception RUE Coordination: decreased fine motor;decreased gross motor   Lower Extremity Assessment Lower Extremity Assessment: Defer to PT evaluation   Cervical / Trunk Assessment Cervical / Trunk Assessment: Normal   Communication Communication Communication: Receptive difficulties;Expressive difficulties   Cognition Arousal/Alertness: Awake/alert Behavior During Therapy: WFL for tasks assessed/performed Overall Cognitive Status: Within Functional Limits for tasks assessed                                 General Comments: is not aware of where his right side is in space; communication impacted but overall cognition seems intact   General Comments  Wife Katie present throughout session    Exercises Exercises: Other exercises Other Exercises Other Exercises: encouraged WB through RUE hand and elbow Other Exercises: encouraged digit flexion AND  extension in RUE   Shoulder Instructions      Home Living Family/patient expects to be discharged to:: Private residence Living Arrangements: Spouse/significant other;Children (16 year old) Available Help at Discharge: Family;Available 24 hours/day Type of Home: House Home Access: Stairs to enter [employer] of Steps: 10 Entrance Stairs-Rails: Right;Left Home Layout: Two level Alternate Level Stairs-Number of Steps: flight   Bathroom Shower/Tub: Occupational psychologist: Standard     Home Equipment: None      Lives With: Spouse    Prior Functioning/Environment Level of Independence: Independent        Comments: working, 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 Problem List: Decreased strength;Decreased range of motion;Decreased activity tolerance;Impaired balance (sitting and/or standing);Decreased coordination;Decreased knowledge of use of DME or AE;Impaired sensation;Impaired UE functional use      OT Treatment/Interventions: Self-care/ADL training;Therapeutic exercise;Neuromuscular education;DME and/or AE instruction;Manual therapy;Therapeutic activities;Patient/family education;Balance training    OT Goals(Current goals can be found in the care plan section) Acute Rehab OT Goals Patient Stated Goal: be independent again OT Goal Formulation: With patient/family Time For Goal Achievement: 03/31/20 Potential to Achieve Goals: Good ADL Goals Pt Will Perform Eating: with adaptive utensils;sitting;with set-up Pt Will Perform Grooming: with modified independence;sitting Pt Will Perform Upper Body Dressing: sitting;with modified independence Pt Will Perform Lower Body Dressing: with min guard assist;sit to/from stand Pt Will Transfer to Toilet: with min guard assist;ambulating Pt Will Perform Toileting - Clothing Manipulation and hygiene: with modified independence;sitting/lateral leans Pt/caregiver will Perform Home  Exercise  Program: Right Upper extremity;Independently;With written HEP provided  OT Frequency: Min 2X/week   Barriers to D/C:            Co-evaluation PT/OT/SLP Co-Evaluation/Treatment: Yes Reason for Co-Treatment: Complexity of the patient's impairments (multi-system involvement);For patient/therapist safety;To address functional/ADL transfers PT goals addressed during session: Mobility/safety with mobility;Balance;Strengthening/ROM OT goals addressed during session: ADL's and self-care;Strengthening/ROM      AM-PAC OT "6 Clicks" Daily Activity     Outcome Measure Help from another person eating meals?: A Little Help from another person taking care of personal grooming?: A Little Help from another person toileting, which includes using toliet, bedpan, or urinal?: A Lot Help from another person bathing (including washing, rinsing, drying)?: A Lot Help from another person to put on and taking off regular upper body clothing?: A Little Help from another person to put on and taking off regular lower body clothing?: A Lot 6 Click Score: 15   End of Session Equipment Utilized During Treatment: Gait belt Nurse Communication: Mobility status  Activity Tolerance: Patient tolerated treatment well Patient left: in bed;with call bell/phone within reach;with bed alarm set;with family/visitor present;with SCD's reapplied  OT Visit Diagnosis: Unsteadiness on feet (R26.81);Other abnormalities of gait and mobility (R26.89);Muscle weakness (generalized) (M62.81);Other symptoms and signs involving cognitive function;Other symptoms and signs involving the nervous system (R29.898);Cognitive communication deficit (R41.841);Hemiplegia and hemiparesis Symptoms and signs involving cognitive functions: Cerebral infarction;Nontraumatic intracerebral hemorrhage Hemiplegia - Right/Left: Right Hemiplegia - dominant/non-dominant: Dominant Hemiplegia - caused by: Nontraumatic intracerebral hemorrhage;Cerebral infarction                 Time: 1420-1503 OT Time Calculation (min): 43 min Charges:  OT General Charges $OT Visit: 1 Visit OT Evaluation $OT Eval Moderate Complexity: 1 Mod OT Treatments $Therapeutic Activity: 8-22 mins  Jesse Sans OTR/L Acute Rehabilitation Services Pager: [phone] Office: [phone]  Cody Henson 03/17/2020, 4:47 PM

## 2020-03-17 NOTE — Progress Notes (Signed)
Bilateral lower extremity venous study completed.      Please see CV Proc for preliminary results.   Xochilth Standish, RVT  

## 2020-03-17 NOTE — Progress Notes (Signed)
Note for TPA given on 1/26 PHARMACIST CODE STROKE RESPONSE  Notified to mix tPA at 2233 by Dr. Cheral Marker Delivered tPA to RN at 2237  tPA dose = 7.5mg  bolus over 1 minute followed by 67.6mg  for a total dose of 75.1mg  over 1 hour  Issues/delays encountered (if applicable): Dennis PharmD. BCPS 03/17/20 3:29 PM

## 2020-03-17 NOTE — Progress Notes (Signed)
  Echocardiogram 2D Echocardiogram has been performed.  Cody Henson 03/17/2020, 1:55 PM

## 2020-03-17 NOTE — ED Notes (Signed)
Patient back from MRI.

## 2020-03-17 NOTE — Evaluation (Signed)
Clinical/Bedside Swallow Evaluation Patient Details  Name: Cody Henson MRN: [id number] Date of Birth: 30-Nov-1975  Today's Date: 03/17/2020 Time: SLP Start Time (ACUTE ONLY): 0959 SLP Stop Time (ACUTE ONLY): 1017 SLP Time Calculation (min) (ACUTE ONLY): 18 min  Past Medical History: History reviewed. No pertinent past medical history. Past Surgical History: History reviewed. No pertinent surgical history. HPI:  Cody Henson is an 45 y.o. male presenting with acute onset of dense expressive and receptive aphasia with right sided weakness. MRI . Patchy multifocal acute ischemic infarcts involving the left cerebral hemisphere.  Associated mild scattered petechial hemorrhage without frank hemorrhagic transformation. No significant PMH.   Assessment / Plan / Recommendation Clinical Impression  During swallow assessment pt's pharyngeal swallow appeared unaffected from stroke. Orally, he managed various boluses effectively with minimal and unsensed labial residue on right. Initiation of swallow, from subjective view, did not appear delayed and s/s aspiration were not present during challenge with multiple straw sips. Regular texture, and thin liquids, pills with thin recommended. ST will follow to ensure oral clearance and safety given his oral apraxia and possible right sided pocketing. SLP Visit Diagnosis: Dysphagia, unspecified (R13.10)    Aspiration Risk  Mild aspiration risk    Diet Recommendation Regular;Thin liquid   Liquid Administration via: Straw;Cup Medication Administration: Whole meds with liquid Supervision: Patient able to self feed Compensations: Small sips/bites;Slow rate Postural Changes: Seated upright at 90 degrees    Other  Recommendations Oral Care Recommendations: Oral care BID   Follow up Recommendations Inpatient Rehab      Frequency and Duration min 2x/week          Prognosis Prognosis for Safe Diet Advancement: Good      Swallow Study   General HPI: Cody Henson is an 45 y.o. male presenting with acute onset of dense expressive and receptive aphasia with right sided weakness. MRI . Patchy multifocal acute ischemic infarcts involving the left cerebral hemisphere.  Associated mild scattered petechial hemorrhage without frank hemorrhagic transformation. No significant PMH. Type of Study: Bedside Swallow Evaluation Previous Swallow Assessment: none Diet Prior to this Study: NPO Temperature Spikes Noted: No Respiratory Status: Room air History of Recent Intubation: No Behavior/Cognition: Alert;Cooperative;Pleasant mood;Requires cueing Oral Cavity Assessment: Within Functional Limits Oral Care Completed by SLP: No Oral Cavity - Dentition: Adequate natural dentition Vision: Functional for self-feeding Self-Feeding Abilities: Needs assist Patient Positioning: Upright in bed Baseline Vocal Quality: Normal Volitional Cough: Strong Volitional Swallow: Able to elicit    Oral/Motor/Sensory Function Overall Oral Motor/Sensory Function: Mild impairment (? right droop, apraxic) Facial Sensation: Reduced right   Ice Chips Ice chips: Not tested   Thin Liquid Thin Liquid: Within functional limits Presentation: Cup;Straw    Nectar Thick Nectar Thick Liquid: Not tested   Honey Thick Honey Thick Liquid: Not tested   Puree Puree: Impaired Oral Phase Impairments: Poor awareness of bolus (labial residue)   Solid     Solid: Within functional limits      Houston Siren 03/17/2020,1:12 PM     Orbie Pyo [place].Ed Risk analyst [phone] Office [phone]

## 2020-03-17 NOTE — Evaluation (Signed)
Speech Language Pathology Evaluation Patient Details Name: Cody Henson MRN: [id number] DOB: 04-24-1975 Today's Date: 03/17/2020 Time: 1937-9024 SLP Time Calculation (min) (ACUTE ONLY): 18 min  Problem List:  Patient Active Problem List   Diagnosis Date Noted   Stroke (cerebrum) ([place]) 03/17/2020   Past Medical History: History reviewed. No pertinent past medical history. Past Surgical History: History reviewed. No pertinent surgical history. HPI:  Cody Henson is an 45 y.o. male presenting with acute onset of dense expressive and receptive aphasia with right sided weakness. MRI . Patchy multifocal acute ischemic infarcts involving the left cerebral hemisphere.  Associated mild scattered petechial hemorrhage without frank hemorrhagic transformation. No significant PMH.   Assessment / Plan / Recommendation Clinical Impression  Pt exhbits impairments in motor planning resulting in oral apraxia and componedt of receptive aphasia. He comprehends basic information but mildly complex verbal information he was 60% accurate on the bedside Western Aphasia Battery short form. Sequential command following with objects was  20% accuracy. He exhibits significant oral apraxia with responses mostly in 1-3 words with frequent hesitations "um, um, yeah."  He could not repeat words. Apraxic errors were inconsistent with mild groping noted and not stimulable for phonemes/place of articulation with visual feedback. Cody Henson is aware of errors and speech difficulty. He wrote first name with left hand but unable to copy letters of wife's name. It is recommended Cody Henson continue focus on speech motor planning and comprehension and inpatient rehab would be beneficial.    SLP Assessment  SLP Recommendation/Assessment: Patient needs continued Speech Lanaguage Pathology Services SLP Visit Diagnosis: Aphasia (R47.01);Apraxia (R48.2)    Follow Up Recommendations  Inpatient Rehab    Frequency and Duration min 2x/week  2 weeks       SLP Evaluation Cognition  Overall Cognitive Status:  (Cognition seemed mostly intact) Arousal/Alertness: Awake/alert Orientation Level: Oriented to person;Oriented to place;Oriented to situation (via yes/no) Attention: Sustained Sustained Attention: Appears intact Memory:  (to be assessed) Awareness: Appears intact (for his verbal errors) Problem Solving: Appears intact Safety/Judgment: Appears intact (?? will continue to assess)       Comprehension  Auditory Comprehension Overall Auditory Comprehension: Impaired Yes/No Questions: Impaired Basic Biographical Questions: 76-100% accurate (90%) Commands: Impaired (one step basic 100%) Multistep Basic Commands: Other (comment) (sequential commands on bedside WAB 20%) Other Conversation Comments:  (mildly abstract y/n 60%) Visual Recognition/Discrimination Discrimination: Not tested Reading Comprehension Reading Status:  (to be assessed)    Expression Expression Primary Mode of Expression: Verbal Verbal Expression Overall Verbal Expression: Impaired Initiation: Impaired Level of Generative/Spontaneous Verbalization: Word Repetition: Impaired Level of Impairment: Word level Naming: Impairment Confrontation:  (0% independent) Verbal Errors:  (see impressions) Pragmatics: No impairment Written Expression Dominant Hand: Right Written Expression: Exceptions to [facility] Copy Ability: Letter Self Formulation Ability: Letter   Oral / Motor  Oral Motor/Sensory Function Overall Oral Motor/Sensory Function: Mild impairment Facial Sensation: Reduced right Mandible: Within Functional Limits Motor Speech Overall Motor Speech: Appears within functional limits for tasks assessed Respiration: Within functional limits Phonation: Normal Resonance: Within functional limits Articulation: Within functional limitis Intelligibility: Intelligible Motor Planning: Impaired Level of Impairment: Word Motor Speech Errors: Groping for  words;Inconsistent;Aware   GO                    Houston Siren 03/17/2020, 1:43 PM  Orbie Pyo Colvin Caroli.Ed Risk analyst [phone] Office [phone]

## 2020-03-17 NOTE — ED Notes (Signed)
Patient transported to MRI 

## 2020-03-17 NOTE — Evaluation (Signed)
Physical Therapy Evaluation Patient Details Name: Cody Henson MRN: [id number] DOB: 1976-02-10 Today's Date: 03/17/2020   History of Present Illness  Cody Henson is an 45 y.o. male presenting with acute onset of dense expressive and receptive aphasia with right sided weakness. MRI . Patchy multifocal acute ischemic infarcts involving the left cerebral hemisphere.  Associated mild scattered petechial hemorrhage without frank hemorrhagic transformation. No significant PMH.  Clinical Impression  Pt admitted with/for stroke with global aphasia and right side weakness and paresis.  Pt needing moderate assist of 1 to 2 persons for basic mobility, standing and pregait activity. .  Pt currently limited functionally due to the problems listed. ( See problems list.)   Pt will benefit from PT to maximize function and safety in order to get ready for next venue listed below.     Follow Up Recommendations CIR;Supervision/Assistance - 24 hour    Equipment Recommendations  Other (comment);Wheelchair (measurements PT);Wheelchair cushion (measurements PT) (TBA)    Recommendations for Other Services Rehab consult     Precautions / Restrictions Precautions Precautions: Fall      Mobility  Bed Mobility Overal bed mobility: Needs Assistance Bed Mobility: Supine to Sit;Sit to Supine     Supine to sit: Mod assist;+2 for safety/equipment;HOB elevated (assist to bring RLE to EOB) Sit to supine: Mod assist;+2 for safety/equipment (assist to manage RUE (to prevent flopping around) and RLE back into bed)   General bed mobility comments: Pt requires assist to manage R side of body and lines    Transfers Overall transfer level: Needs assistance Equipment used: 2 person hand held assist Transfers: Sit to/from Stand Sit to Stand: Mod assist;+2 physical assistance;+2 safety/equipment;From elevated surface (ICU bed)         General transfer comment: RLE requires blocking to prevent buckling and RUE  requires support as it cannot currently grasp objects during standing tasks  Ambulation/Gait             General Gait Details: NT today  Stairs            Wheelchair Mobility    Modified Rankin (Stroke Patients Only) Modified Rankin (Stroke Patients Only) Pre-Morbid Rankin Score: No symptoms Modified Rankin: Severe disability     Balance Overall balance assessment: Needs assistance Sitting-balance support: Single extremity supported;Feet supported Sitting balance-Leahy Scale: Fair Sitting balance - Comments: can sit EOB at supervision level, can take light challenge   Standing balance support: Single extremity supported Standing balance-Leahy Scale: Poor Standing balance comment: dependent on external support.  Worked on w/shifting with holds to the left, unweighting in order to step either LE forward/back.                             Pertinent Vitals/Pain Pain Assessment: Faces Faces Pain Scale: Hurts a little bit Pain Location: generalized Pain Descriptors / Indicators: Discomfort Pain Intervention(s): Monitored during session    Home Living Family/patient expects to be discharged to:: Private residence Living Arrangements: Spouse/significant other;Children (43 year old) Available Help at Discharge: Family;Available 24 hours/day Type of Home: House Home Access: Stairs to enter Entrance Stairs-Rails: Psychiatric nurse of Steps: 10 Home Layout: Two level Home Equipment: None      Prior Function Level of Independence: Independent         Comments: working, 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  Pt report numbness and does not have strong proprioceptive input.  Pt not aware of where is foot is in stance or the position of his knee in stance with visual confirmation. RLE Sensation: decreased proprioception RLE Coordination: decreased fine motor    Cervical / Trunk Assessment Cervical / Trunk Assessment: Normal  Communication   Communication: Receptive difficulties;Expressive difficulties  Cognition Arousal/Alertness: Awake/alert Behavior During Therapy: WFL for tasks assessed/performed Overall Cognitive Status: Within Functional Limits for tasks assessed                                 General Comments: is not aware of where his right side is in space; communication impacted but overall cognition seems intact      General Comments General comments (skin integrity, edema, etc.): wife present though out the treatment    Exercises Other Exercises Other Exercises: encouraged WB through RUE hand and elbow Other Exercises: encouraged digit flexion AND extension in RUE Other Exercises: hip/knee flex/extention bil with emphasis on control on the right.   Assessment/Plan    PT Assessment Patient needs continued PT services  PT Problem List Decreased strength;Decreased activity tolerance;Decreased balance;Decreased mobility;Decreased coordination;Impaired sensation;Decreased knowledge of use of DME       PT Treatment Interventions DME instruction;Gait training;Functional mobility training;Therapeutic activities;Therapeutic exercise;Balance training;Neuromuscular re-education;Patient/family education    PT Goals (Current goals can be found in the Care Plan section)  Acute Rehab PT Goals Patient Stated Goal: be independent again PT Goal Formulation: With patient Time For Goal Achievement: 03/31/20 Potential to Achieve Goals: Good    Frequency Min 4X/week   Barriers to discharge        Co-evaluation PT/OT/SLP Co-Evaluation/Treatment: Yes Reason for  Co-Treatment: Complexity of the patient's impairments (multi-system involvement) PT goals addressed during session: Mobility/safety with mobility         AM-PAC PT "6 Clicks" Mobility  Outcome Measure Help needed turning from your back to your side while in a flat bed without using bedrails?: A Lot Help needed moving from lying on your back to sitting on the side of a flat bed without using bedrails?: A Lot Help needed moving to and from a bed to a chair (including a wheelchair)?: A Lot Help needed standing up from a chair using your arms (e.g., wheelchair or bedside chair)?: A Lot Help needed to walk in hospital room?: A Lot Help needed climbing 3-5 steps with a railing? : A Lot 6 Click Score: 12    End of Session   Activity Tolerance: Patient tolerated treatment well Patient left: in bed;with call bell/phone within reach;with bed alarm set;with family/visitor present Nurse Communication: Mobility status PT Visit Diagnosis: Apraxia (R48.2);Other symptoms and signs involving the nervous system (R29.898);Hemiplegia and hemiparesis Hemiplegia - Right/Left: Right Hemiplegia - caused by: Cerebral infarction    Time: 1420-1503 PT Time Calculation (min) (ACUTE ONLY): 43 min   Charges:   PT Evaluation $PT Eval Moderate Complexity: 1 Mod          03/17/2020  Ginger Carne., PT Acute Rehabilitation Services [phone]  (pager) [phone]  (office)  Tessie Fass Rolando Hessling 03/17/2020, 7:17 PM

## 2020-03-17 NOTE — Progress Notes (Signed)
New Admission Note:  Arrival Method: Bed Mental Orientation: A&O X4 Telemetry: 3WM10 Assessment: Completed Skin: None  IV: Left AC/Left FA Pain: None Safety Measures: Safety Fall Prevention Plan was given, discussed. Admission: Completed 3W: Patient has been orientated to the room, unit and the staff. Family: N/A  Orders have been reviewed and implemented. Will continue to monitor the patient. Call light has been placed within reach and bed alarm has been activated.   Neurology notified 24 hour CT has been completed prior to transfer.  Lacretia Leigh, RN

## 2020-03-18 ENCOUNTER — Encounter (HOSPITAL_COMMUNITY): Payer: Self-pay | Admitting: Neurology

## 2020-03-18 ENCOUNTER — Inpatient Hospital Stay (HOSPITAL_COMMUNITY): Payer: 59

## 2020-03-18 DIAGNOSIS — R338 Other retention of urine: Secondary | ICD-10-CM

## 2020-03-18 DIAGNOSIS — R030 Elevated blood-pressure reading, without diagnosis of hypertension: Secondary | ICD-10-CM

## 2020-03-18 DIAGNOSIS — R739 Hyperglycemia, unspecified: Secondary | ICD-10-CM | POA: Diagnosis not present

## 2020-03-18 DIAGNOSIS — F32A Depression, unspecified: Secondary | ICD-10-CM

## 2020-03-18 DIAGNOSIS — G8191 Hemiplegia, unspecified affecting right dominant side: Secondary | ICD-10-CM

## 2020-03-18 DIAGNOSIS — Q211 Atrial septal defect: Secondary | ICD-10-CM

## 2020-03-18 DIAGNOSIS — R4701 Aphasia: Secondary | ICD-10-CM | POA: Diagnosis not present

## 2020-03-18 DIAGNOSIS — I639 Cerebral infarction, unspecified: Secondary | ICD-10-CM

## 2020-03-18 LAB — CBC
HCT: 43.8 % (ref 39.0–52.0)
Hemoglobin: 14.6 g/dL (ref 13.0–17.0)
MCH: 29.3 pg (ref 26.0–34.0)
MCHC: 33.3 g/dL (ref 30.0–36.0)
MCV: 87.8 fL (ref 80.0–100.0)
Platelets: 245 10*3/uL (ref 150–400)
RBC: 4.99 MIL/uL (ref 4.22–5.81)
RDW: 12.7 % (ref 11.5–15.5)
WBC: 10.4 10*3/uL (ref 4.0–10.5)
nRBC: 0 % (ref 0.0–0.2)

## 2020-03-18 LAB — C-REACTIVE PROTEIN: CRP: 0.5 mg/dL (ref <1.0)

## 2020-03-18 LAB — TSH: TSH: 1.782 u[IU]/mL (ref 0.350–4.500)

## 2020-03-18 LAB — BASIC METABOLIC PANEL
Anion gap: 12 (ref 5–15)
BUN: 10 mg/dL (ref 6–20)
CO2: 22 mmol/L (ref 22–32)
Calcium: 9.3 mg/dL (ref 8.9–10.3)
Chloride: 103 mmol/L (ref 98–111)
Creatinine, Ser: 1.04 mg/dL (ref 0.61–1.24)
GFR, Estimated: >60 mL/min (ref >60)
Glucose, Bld: 117 mg/dL — ABNORMAL HIGH (ref 70–99)
Potassium: 3.8 mmol/L (ref 3.5–5.1)
Sodium: 137 mmol/L (ref 135–145)

## 2020-03-18 LAB — SEDIMENTATION RATE: Sed Rate: 10 mm/hr (ref 0–16)

## 2020-03-18 LAB — VITAMIN B12: Vitamin B-12: 257 pg/mL (ref 180–914)

## 2020-03-18 LAB — RPR: RPR Ser Ql: NONREACTIVE

## 2020-03-18 MED ORDER — ASPIRIN 81 MG PO CHEW
81.0000 mg | CHEWABLE_TABLET | Freq: Every day | ORAL | Status: DC
  Administered 2020-03-18 – 2020-03-23 (×5): 81 mg via ORAL
  Filled 2020-03-18 (×7): qty 1

## 2020-03-18 MED ORDER — CLOPIDOGREL BISULFATE 75 MG PO TABS
75.0000 mg | ORAL_TABLET | Freq: Every day | ORAL | Status: DC
  Administered 2020-03-18 – 2020-03-23 (×5): 75 mg via ORAL
  Filled 2020-03-18 (×6): qty 1

## 2020-03-18 MED ORDER — BETHANECHOL CHLORIDE 10 MG PO TABS
10.0000 mg | ORAL_TABLET | Freq: Three times a day (TID) | ORAL | Status: DC
  Administered 2020-03-18 – 2020-03-23 (×16): 10 mg via ORAL
  Filled 2020-03-18 (×16): qty 1

## 2020-03-18 MED ORDER — ENOXAPARIN SODIUM 40 MG/0.4ML ~~LOC~~ SOLN
40.0000 mg | SUBCUTANEOUS | Status: DC
  Administered 2020-03-18 – 2020-03-23 (×5): 40 mg via SUBCUTANEOUS
  Filled 2020-03-18 (×6): qty 0.4

## 2020-03-18 NOTE — Progress Notes (Signed)
Transcranial doppler with bubble study completed.   Please see CV Proc for preliminary results.   Darlin Coco, RDMS

## 2020-03-18 NOTE — Plan of Care (Signed)

## 2020-03-18 NOTE — Progress Notes (Signed)
CT head shows no hemorrhagic conversion 24 hours after tPA.  Starting ASA.   Electronically signed: Dr. Kerney Elbe

## 2020-03-18 NOTE — Progress Notes (Signed)
Physical Therapy Treatment Patient Details Name: Cody Henson MRN: [id number] DOB: 31-Dec-1975 Today's Date: 03/18/2020    History of Present Illness Cody Henson is an 45 y.o. male presenting with acute onset of dense expressive and receptive aphasia with right sided weakness. MRI . Patchy multifocal acute ischemic infarcts involving the left cerebral hemisphere.  Associated mild scattered petechial hemorrhage without frank hemorrhagic transformation. No significant PMH.    PT Comments    Pt making significant progress towards his goals only needing +1 assist this date. His balance and safety with mobility is affected by his R UE and lower extremity flexion synergy, limiting his ability to hold onto the RW, weight shift to the R, and stand fully upright. This places him at high risk for falls. Pt required modA initially for sit to stand transfers, but once the feet were placed symmetrically under him he was able to come to stand with less attempts and only minA. In addition, his poor coordination/motor control along with his cognitive deficits in sequencing impacted his ability to take steps initially. Pt benefited from visual cues of demonstration and a mirror to allow him to comprehend weight shifting and contralateral leg lifting/advancement, allowing him to ambulate within the room today with modA. Remainder of session focused on improving muscle activation and decreasing pt's flexion synergy on his R through performing PNF techniques and other exercises focusing on single joint and intermittent multi-joint movements. Will continue to follow acutely. Current recommendations remain appropriate.   Follow Up Recommendations  CIR;Supervision/Assistance - 24 hour     Equipment Recommendations  Other (comment);Wheelchair (measurements PT);Wheelchair cushion (measurements PT) (TBA)    Recommendations for Other Services Rehab consult     Precautions / Restrictions Precautions Precautions:  Fall Restrictions Weight Bearing Restrictions: No    Mobility  Bed Mobility Overal bed mobility: Needs Assistance Bed Mobility: Supine to Sit     Supine to sit: HOB elevated;Min assist     General bed mobility comments: Pt initiating movement of legs to R EOB, requiring cues to sequence hand movement to ascend trunk with minA. Extra time and effort.  Transfers Overall transfer level: Needs assistance Equipment used: Rolling walker (2 wheeled) Transfers: Sit to/from [facility] Sit to Stand: Mod assist Stand pivot transfers: Mod assist       General transfer comment: Cues for R hand on RW and L hand on current sitting surface to push up to stand, good carryover. Required modA to power up to stand after pt attempts 2-3x, demonstrating difficulty keeping R hand on RW due to flexion synergy. sit to stand 1x from EOB and 2x from recliner, with only minA on 3rd rep when feet were placed symmetrically under pt. ModA to direct pt with stand step to L due to flexion synergy in R UE and leg with poor motor control and sequencing of weight shifting and leg advancement initially.  Ambulation/Gait Ambulation/Gait assistance: Mod assist Gait Distance (Feet): 14 Feet (x3 bouts of ~2 ft > ~14 ft > ~14 ft) Assistive device: Rolling walker (2 wheeled) Gait Pattern/deviations: Step-through pattern;Decreased step length - right;Decreased step length - left;Decreased stride length;Decreased dorsiflexion - right;Decreased weight shift to right;Trunk flexed Gait velocity: reduced Gait velocity interpretation: <1.31 ft/sec, indicative of household ambulator General Gait Details: 1st bout pt unable to comprehend/sequence weight shifting to each leg to allow contralateral leg to advance despite max verbal and tactile cues. Provided demonstration after 1st bout and obtained mirror for pt to watch self with static marching, noted  improved motor control allowing for each leg to lift off ground.  Progressed to ambulating 2 additional bouts, requiring modA especially with R stance due to trunk lateral flexion to R but weight shift to R and R leg flexion synergy impacting his balance in stance. Extra time sequencing RW and steps with turns, good carryover. easier to turn to R than L.   Stairs             Wheelchair Mobility    Modified Rankin (Stroke Patients Only) Modified Rankin (Stroke Patients Only) Pre-Morbid Rankin Score: No symptoms Modified Rankin: Moderately severe disability     Balance Overall balance assessment: Needs assistance Sitting-balance support: Single extremity supported;Feet supported Sitting balance-Leahy Scale: Fair Sitting balance - Comments: can sit EOB at supervision level   Standing balance support: Single extremity supported Standing balance-Leahy Scale: Poor Standing balance comment: dependent on external support and L UE support                            Cognition Arousal/Alertness: Awake/alert Behavior During Therapy: WFL for tasks assessed/performed Overall Cognitive Status: Impaired/Different from baseline Area of Impairment: Problem solving;Memory                     Memory: Decreased short-term memory       Problem Solving: Slow processing;Difficulty sequencing;Requires verbal cues;Requires tactile cues General Comments: difficulty sequencing and requires multi-modal cues with demonstration to understand directions at times; STM deficits remembering HEP; communication impacted but overall cognition seems intact otherwise      Exercises General Exercises - Upper Extremity Elbow Extension: Strengthening;Right;10 reps;Seated Wrist Flexion: Strengthening;Right;10 reps;Seated Wrist Extension: Strengthening;Right;10 reps;Seated General Exercises - Lower Extremity Long Arc Quad: Strengthening;Right;10 reps;Seated Toe Raises: Strengthening;Both;10 reps;Seated (simultaneously to facilitate neural crossover, min  activation on R noted) Other Exercises Other Exercises: PNF D1 flexion/extension of R UE 5x, progressing from AAROM initially to AROM final reps with only assist at wrist for D1 extension Other Exercises: PNF D1 flexion/extension of R leg 5x, needing assistance primarily with D1 extension    General Comments General comments (skin integrity, edema, etc.): HEP verbalized, demonstrated, and hand-written for pt to perform R elbow extension, wrist flexion/extension with hand open and closed, LAQ, and bil simultaneous ankle dorsiflexion (foot taps)      Pertinent Vitals/Pain Pain Assessment: No/denies pain Pain Intervention(s): Monitored during session    Home Living                      Prior Function            PT Goals (current goals can now be found in the care plan section) Acute Rehab PT Goals Patient Stated Goal: be independent again PT Goal Formulation: With patient Time For Goal Achievement: 03/31/20 Potential to Achieve Goals: Good Progress towards PT goals: Progressing toward goals    Frequency    Min 4X/week      PT Plan Current plan remains appropriate    Co-evaluation              AM-PAC PT "6 Clicks" Mobility   Outcome Measure  Help needed turning from your back to your side while in a flat bed without using bedrails?: A Lot Help needed moving from lying on your back to sitting on the side of a flat bed without using bedrails?: A Lot Help needed moving to and from a bed to a chair (including a wheelchair)?:  A Lot Help needed standing up from a chair using your arms (e.g., wheelchair or bedside chair)?: A Lot Help needed to walk in hospital room?: A Lot Help needed climbing 3-5 steps with a railing? : A Lot 6 Click Score: 12    End of Session Equipment Utilized During Treatment: Gait belt Activity Tolerance: Patient tolerated treatment well Patient left: with call bell/phone within reach;with family/visitor present;in chair;with chair alarm  set   PT Visit Diagnosis: Apraxia (R48.2);Other symptoms and signs involving the nervous system (R29.898);Hemiplegia and hemiparesis;Unsteadiness on feet (R26.81);Other abnormalities of gait and mobility (R26.89);Muscle weakness (generalized) (M62.81);Difficulty in walking, not elsewhere classified (R26.2) Hemiplegia - Right/Left: Right Hemiplegia - caused by: Cerebral infarction     Time: 8250-5397 PT Time Calculation (min) (ACUTE ONLY): 71 min  Charges:  $Gait Training: 23-37 mins $Therapeutic Activity: 8-22 mins $Neuromuscular Re-education: 23-37 mins                     Moishe Spice, PT, DPT Acute Rehabilitation Services  Pager: [phone] Office: [place] 03/18/2020, 5:40 PM

## 2020-03-18 NOTE — Progress Notes (Signed)
    [facility] has been requested to perform a transesophageal echocardiogram on Cody Henson for stroke.  After careful review of history and examination, the risks and benefits of transesophageal echocardiogram have been explained including risks of esophageal damage, perforation (1:10,000 risk), bleeding, pharyngeal hematoma as well as other potential complications associated with conscious sedation including aspiration, arrhythmia, respiratory failure and death. Alternatives to treatment were discussed, questions were answered. Patient is willing to proceed.  TEE - Dr. Margaretann Loveless 03/21/20 @ 8am . NPO after midnight. Meds with sips.   Leanor Kail, PA-C 03/18/2020 5:39 PM

## 2020-03-18 NOTE — Consult Note (Addendum)
Physical Medicine and Rehabilitation Consult   Reason for Consult: Stroke with functional deficits.  Referring Physician: Dr. Erlinda Hong  HPI: Cody Henson is a 45 y.o. male with history of depression otherwise in good health who was admitted on 03/16/2020 with right hemiparesis, facial weakness, and dysarthria.  History taken from chart review and wife due to aphasia.  CT head with evolving hypodensity in mid/posterior left frontal lobe and CTA head/neck negative for LVO or high grade stenosis.  UDS positive for THC.  Follow up MRI brain done revealing patchy multifocal acute ischemic infarcts in left cerebral hemisphere without hemorrhagic transformation.  He received tPA.  Follow-up head CT personally reviewed, negative for bleed.  Expected evolution. Echocardiogram with ejection fraction of 60-65%, mild dilatation of aortic root and no wall abnormality. BLE dopplers negative for DVT.  Hospital course further complicated by urinary retention, and was cathed for 1 liter of urine. Full stroke work up underway with TEE planned for 01/31.  Aspirin and Plavix started by neurology.  Therapy evaluations completed revealing global aphasia, oral apraxia, right hemiopsia and right sided weakness affecting ADLs and mobility. CIR recommended due to functional decline.   Review of Systems  Unable to perform ROS: Mental acuity   Past Medical History:  Diagnosis Date  . Depressed    No past surgical history.  Family History  Problem Relation Age of Onset  . Stroke Paternal Grandfather   . Stroke Maternal Aunt      Social History: Cody Henson is pregnant, expecting in June. 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 He does not smoke or use smokeless tobacco.  He drinks couple of beers/week.    Allergies  Allergen Reactions  . Albumen, Egg Itching  . Other Hives    Chicken meat, [place]    Medications Prior to Admission  Medication Sig Dispense Refill  . acetaminophen (TYLENOL)  325 MG tablet Take 650 mg by mouth every 6 (six) hours as needed for mild pain or headache.    . amphetamine-dextroamphetamine (ADDERALL) 10 MG tablet Take 10 mg by mouth 3 (three) times daily.    . Multiple Vitamin (MULTIVITAMIN) tablet Take 1 tablet by mouth daily.    . sertraline (ZOLOFT) 50 MG tablet Take 50 mg by mouth daily.      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, research (life sciences) Status:  Mobility: [place] bed mobility: Needs Assistance Bed Mobility: Supine to Sit,Sit to Supine Supine to sit: Mod assist,+2 for safety/equipment,HOB elevated (assist to bring RLE to EOB) Sit to supine: Mod assist,+2 for safety/equipment (assist to manage Henson (to prevent flopping around) and RLE back into bed) General bed mobility comments: Pt requires assist to manage R side of body and lines Transfers Overall transfer level: Needs assistance Equipment used: 2 person hand held assist Transfers: Sit to/from Stand Sit to Stand: Mod assist,+2 physical assistance,+2 safety/equipment,From elevated surface (ICU bed) General transfer comment: RLE requires blocking to prevent buckling and Henson requires support as it cannot currently grasp objects  during standing tasks Ambulation/Gait General Gait Details: NT today    ADL: ADL Overall ADL's : Needs assistance/impaired Eating/Feeding:  Moderate assistance Grooming: Wash/dry hands,Wash/dry face,Moderate assistance,Sitting Grooming Details (indicate cue type and reason): increased time, mod A for assist with Henson and cues how to use LUE to assist Henson Upper Body Bathing: Moderate assistance,Sitting Lower Body Bathing: Moderate assistance,Sit to/from stand Lower Body Bathing Details (indicate cue type and reason): can perform figure 4 on L but not R Upper Body Dressing : Moderate assistance,Sitting Lower Body Dressing: Moderate assistance Lower Body Dressing Details (indicate cue type and reason): con don sock on L foot but not right, did attempt to engage Henson with cues Toilet Transfer: Moderate assistance,+2 for physical assistance,+2 for safety/equipment Toilet Transfer Details (indicate cue type and reason): 2 person HHA. blocking for LLE and support for Henson Toileting- Clothing Manipulation and Hygiene: Maximal assistance Functional mobility during ADLs: Moderate assistance,+2 for physical assistance,+2 for safety/equipment (2 person HHA)  Cognition: Cognition Overall Cognitive Status: Within Functional Limits for tasks assessed Arousal/Alertness: Awake/alert Orientation Level: Oriented X4 Attention: Sustained Sustained Attention: Appears intact Memory:  (to be assessed) Awareness: Appears intact (for his verbal errors) Problem Solving: Appears intact Safety/Judgment: Appears intact (?? will continue to assess) Cognition Arousal/Alertness: Awake/alert Behavior During Therapy: WFL for tasks assessed/performed Overall Cognitive Status: Within Functional Limits for tasks assessed General Comments: is not aware of where his right side is in space; communication impacted but overall cognition seems intact  Blood pressure (!) 147/101, pulse 79, temperature 98.6 F (37 C), temperature source Oral, resp. rate 19, weight 75.1 kg, SpO2 94 %. Physical Exam Vitals and nursing note reviewed.  Constitutional:      General: He  is not in acute distress.    Appearance: Normal appearance. He is normal weight. He is not ill-appearing.  HENT:     Head: Normocephalic and atraumatic.     Right Ear: External ear normal.     Left Ear: External ear normal.     Nose: Nose normal.  Eyes:     General:        Right eye: No discharge.        Left eye: No discharge.     Extraocular Movements: Extraocular movements intact.  Cardiovascular:     Rate and Rhythm: Normal rate and regular rhythm.  Pulmonary:     Effort: Pulmonary effort is normal. No respiratory distress.     Breath sounds: No stridor.  Abdominal:     General: Abdomen is flat. Bowel sounds are normal. There is no distension.  Musculoskeletal:     Cervical back: Normal range of motion and neck supple.     Comments: No edema or tenderness in extremities  Skin:    General: Skin is warm and dry.  Neurological:     Mental Status: He is alert.     Comments: Alert Expressive >> receptive aphasia Motor: Henson: Shoulder abduction, elbow flexion/extension 3/5, wrist extension, handgrip 0/5 Right lower extremity: Hip flexion, knee extension 2+/5, ankle 2/5 LE/LE: 5/5 proximal and distal  Psychiatric:     Comments: Limited due to aphasia     Results for orders placed or performed during the hospital encounter of 03/16/20 (from the past 24 hour(s))  Glucose, capillary     Status: Abnormal   Collection Time: 03/17/20  3:48 PM  Result Value Ref Range   Glucose-Capillary 105 (H) 70 - 99 mg/dL  Vitamin B12     Status: None   Collection Time:  03/18/20  7:18 AM  Result Value Ref Range   Vitamin B-12 257 180 - 914 pg/mL  TSH     Status: None   Collection Time: 03/18/20  7:18 AM  Result Value Ref Range   TSH 1.782 0.350 - 4.500 uIU/mL  RPR     Status: None   Collection Time: 03/18/20  7:18 AM  Result Value Ref Range   RPR Ser Ql NON REACTIVE NON REACTIVE  C-reactive protein     Status: None   Collection Time: 03/18/20  7:18 AM  Result Value Ref Range   CRP 0.5  <1.0 mg/dL  Sedimentation rate     Status: None   Collection Time: 03/18/20  7:18 AM  Result Value Ref Range   Sed Rate 10 0 - 16 mm/hr  CBC     Status: None   Collection Time: 03/18/20  7:18 AM  Result Value Ref Range   WBC 10.4 4.0 - 10.5 K/uL   RBC 4.99 4.22 - 5.81 MIL/uL   Hemoglobin 14.6 13.0 - 17.0 g/dL   HCT 43.8 39.0 - 52.0 %   MCV 87.8 80.0 - 100.0 fL   MCH 29.3 26.0 - 34.0 pg   MCHC 33.3 30.0 - 36.0 g/dL   RDW 12.7 11.5 - 15.5 %   Platelets 245 150 - 400 K/uL   nRBC 0.0 0.0 - 0.2 %  Basic metabolic panel     Status: Abnormal   Collection Time: 03/18/20  7:18 AM  Result Value Ref Range   Sodium 137 135 - 145 mmol/L   Potassium 3.8 3.5 - 5.1 mmol/L   Chloride 103 98 - 111 mmol/L   CO2 22 22 - 32 mmol/L   Glucose, Bld 117 (H) 70 - 99 mg/dL   BUN 10 6 - 20 mg/dL   Creatinine, Ser 1.04 0.61 - 1.24 mg/dL   Calcium 9.3 8.9 - 10.3 mg/dL   GFR, Estimated >60 >60 mL/min   Anion gap 12 5 - 15   CT HEAD WO CONTRAST  Result Date: 03/17/2020 CLINICAL DATA:  Stroke follow-up EXAM: CT HEAD WITHOUT CONTRAST TECHNIQUE: Contiguous axial images were obtained from the base of the skull through the vertex without intravenous contrast. COMPARISON:  Head CT 03/16/2020 FINDINGS: Brain: Progression of cytotoxic edema in the posterior left MCA territory infarct affecting the parietal and temporal lobes. No acute hemorrhage. Cytotoxic edema has also increased in the more anterior left MCA infarct location. No midline shift or other mass effect. Vascular: No hyperdense vessel or unexpected calcification. Skull: Normal. Negative for fracture or focal lesion. Sinuses/Orbits: No acute finding. Other: None. IMPRESSION: Expected progression of cytotoxic edema within multifocal left MCA infarct sites. No hemorrhage. Electronically Signed   By: Ulyses Jarred M.D.   On: 03/17/2020 22:06   CT ANGIO NECK W OR WO CONTRAST  Result Date: 03/16/2020 CLINICAL DATA:  Initial evaluation for acute stroke, receptive  and expressive aphasia. EXAM: CT ANGIOGRAPHY HEAD AND NECK TECHNIQUE: Multidetector CT imaging of the head and neck was performed using the standard protocol during bolus administration of intravenous contrast. Multiplanar CT image reconstructions and MIPs were obtained to evaluate the vascular anatomy. Carotid stenosis measurements (when applicable) are obtained utilizing NASCET criteria, using the distal internal carotid diameter as the denominator. CONTRAST:  18mL OMNIPAQUE IOHEXOL 350 MG/ML SOLN COMPARISON:  Prior head CT from earlier the same day. FINDINGS: CTA NECK FINDINGS Aortic arch: Visualized aortic arch of normal caliber with normal branch pattern. No hemodynamically significant stenosis  seen about the origin of the great vessels. Right carotid system: Right common and internal carotid arteries patent without stenosis, dissection or occlusion. Left carotid system: Left common and internal carotid arteries patent without stenosis, dissection or occlusion. Vertebral arteries: Both vertebral arteries arise from the subclavian arteries. Vertebral arteries widely patent without stenosis, dissection or occlusion. Skeleton: No visible acute osseous abnormality. No discrete or worrisome osseous lesions. Other neck: No other acute soft tissue abnormality within the neck. No mass or adenopathy. Upper chest: Small layering right pleural effusion noted. Partially visualized lungs and upper chest demonstrate no other acute finding. Review of the MIP images confirms the above findings CTA HEAD FINDINGS Anterior circulation: Both internal carotid arteries patent to the termini without stenosis. A1 segments patent bilaterally. Normal anterior communicating artery complex. Anterior cerebral arteries patent to their distal aspects without stenosis. No M1 stenosis or occlusion. Normal MCA bifurcations. No proximal MCA branch occlusion. Mild irregularity of distal left MCA branches noted, which could be related to recent  recanalization. No visible proximal branch occlusion. Posterior circulation: Both V4 segments patent to the vertebrobasilar junction without stenosis. Both PICA origins patent and normal. Basilar patent to its distal aspect without stenosis. Superior cerebellar arteries patent bilaterally. Right PCA supplied via the basilar. Predominant fetal type origin of the left PCA. Both PCAs well perfused to their distal aspects without stenosis. Venous sinuses: Patent. Anatomic variants: Fetal type origin left PCA.  No aneurysm. Review of the MIP images confirms the above findings IMPRESSION: 1. Negative CTA for large vessel occlusion. No high-grade or correctable stenosis identified. 2. Mild irregularity of distal left MCA branches, which could be related to recent recanalization. 3. Small layering right pleural effusion. Critical Value/emergent results were called by telephone at the time of interpretation on 03/16/2020 at 10:55 pm to provider ERIC [facility] , who verbally acknowledged these results. Electronically Signed   By: Jeannine Boga M.D.   On: 03/16/2020 23:23   MR BRAIN WO CONTRAST  Result Date: 03/17/2020 CLINICAL DATA:  Follow-up examination for acute stroke. EXAM: MRI HEAD WITHOUT CONTRAST TECHNIQUE: Multiplanar, multiecho pulse sequences of the brain and surrounding structures were obtained without intravenous contrast. COMPARISON:  Prior CTs from 03/16/2020. FINDINGS: Brain: Cerebral volume within normal limits. No significant cerebral white matter disease. Patchy multifocal areas of restricted diffusion seen involving the cortical and subcortical aspect of the left frontal and parietal lobes as well as the left temporal occipital region, consistent with acute ischemic infarcts, left MCA distribution. Associated mild scattered petechial hemorrhage without frank hemorrhagic transformation. No significant regional mass effect. No other evidence for acute or subacute ischemia. Gray-white matter  differentiation otherwise maintained. No encephalomalacia to suggest chronic cortical infarction elsewhere within the brain. No other evidence for acute or chronic intracranial hemorrhage. 2 cm benign arachnoid cyst overlies the right frontal convexity. No other mass lesion, mass effect or midline shift. No hydrocephalus or extra-axial fluid collection. Pituitary gland suprasellar region within normal limits. Midline structures intact. Vascular: Major intracranial vascular flow voids are maintained. Skull and upper cervical spine: Craniocervical junction within normal limits. Bone marrow signal intensity normal. No scalp soft tissue abnormality. Sinuses/Orbits: Globes and orbital soft tissues within normal limits. Paranasal sinuses are clear. No significant mastoid effusion. Inner ear structures grossly normal. Other: None. IMPRESSION: 1. Patchy multifocal acute ischemic infarcts involving the left cerebral hemisphere as above. Associated mild scattered petechial hemorrhage without frank hemorrhagic transformation. No significant regional mass effect. 2. Otherwise normal brain MRI for age. Electronically Signed  By: Jeannine Boga M.D.   On: 03/17/2020 03:24   ECHOCARDIOGRAM COMPLETE  Result Date: 03/17/2020    ECHOCARDIOGRAM REPORT   Patient Name:   ARIEL DETHLOFF Date of Exam: 03/17/2020 Medical Rec #:  [id number]  Height: Accession #:    [id number] Weight:       165.6 lb Date of Birth:  [birth year]  BSA:          1.780 m Patient Age:    40 years   BP:           121/96 mmHg Patient Gender: M          HR:           63 bpm. Exam Location:  Inpatient Procedure: 2D Echo, Cardiac Doppler and Color Doppler Indications:    Stroke  History:        Patient has no prior history of Echocardiogram examinations.                 Stroke. No cardiac history.  Sonographer:    Roseanna Rainbow RDCS Referring Phys: [place]  1. Left ventricular ejection fraction, by estimation, is 60 to 65%. The left ventricle has  normal function. The left ventricle has no regional wall motion abnormalities. Left ventricular diastolic parameters were normal.  2. Right ventricular systolic function is normal. The right ventricular size is normal. Tricuspid regurgitation signal is inadequate for assessing PA pressure.  3. The mitral valve is normal in structure. No evidence of mitral valve regurgitation. No evidence of mitral stenosis.  4. The aortic valve is tricuspid. Aortic valve regurgitation is not visualized. No aortic stenosis is present.  5. Aortic dilatation noted. There is mild dilatation of the aortic root, measuring 38 mm.  6. The inferior vena cava is normal in size with greater than 50% respiratory variability, suggesting right atrial pressure of 3 mmHg. FINDINGS  Left Ventricle: Left ventricular ejection fraction, by estimation, is 60 to 65%. The left ventricle has normal function. The left ventricle has no regional wall motion abnormalities. The left ventricular internal cavity size was normal in size. There is  no left ventricular hypertrophy. Left ventricular diastolic parameters were normal. Right Ventricle: The right ventricular size is normal. No increase in right ventricular wall thickness. Right ventricular systolic function is normal. Tricuspid regurgitation signal is inadequate for assessing PA pressure. Left Atrium: Left atrial size was normal in size. Right Atrium: Right atrial size was normal in size. Pericardium: There is no evidence of pericardial effusion. Mitral Valve: The mitral valve is normal in structure. No evidence of mitral valve regurgitation. No evidence of mitral valve stenosis. Tricuspid Valve: The tricuspid valve is normal in structure. Tricuspid valve regurgitation is not demonstrated. Aortic Valve: The aortic valve is tricuspid. Aortic valve regurgitation is not visualized. No aortic stenosis is present. Pulmonic Valve: The pulmonic valve was normal in structure. Pulmonic valve regurgitation is not  visualized. Aorta: Aortic dilatation noted. There is mild dilatation of the aortic root, measuring 38 mm. Venous: The inferior vena cava is normal in size with greater than 50% respiratory variability, suggesting right atrial pressure of 3 mmHg. IAS/Shunts: No atrial level shunt detected by color flow Doppler.  LEFT VENTRICLE PLAX 2D LVIDd:         3.70 cm     Diastology LVIDs:         2.00 cm     LV e' medial:    8.70 cm/s LV PW:  1.50 cm     LV E/e' medial:  8.0 LV IVS:        1.30 cm     LV e' lateral:   10.10 cm/s LVOT diam:     2.00 cm     LV E/e' lateral: 6.9 LV SV:         62 LV SV Index:   35 LVOT Area:     3.14 cm  LV Volumes (MOD) LV vol d, MOD A2C: 82.2 ml LV vol d, MOD A4C: 49.0 ml LV vol s, MOD A2C: 27.5 ml LV vol s, MOD A4C: 19.1 ml LV SV MOD A2C:     54.7 ml LV SV MOD A4C:     49.0 ml LV SV MOD BP:      40.2 ml RIGHT VENTRICLE             IVC RV S prime:     13.70 cm/s  IVC diam: 1.90 cm TAPSE (M-mode): 1.7 cm LEFT ATRIUM             Index       RIGHT ATRIUM           Index LA diam:        3.10 cm 1.74 cm/m  RA Area:     10.90 cm LA Vol (A2C):   16.9 ml 9.49 ml/m  RA Volume:   23.10 ml  12.98 ml/m LA Vol (A4C):   18.2 ml 10.22 ml/m LA Biplane Vol: 17.7 ml 9.94 ml/m  AORTIC VALVE LVOT Vmax:   94.20 cm/s LVOT Vmean:  60.300 cm/s LVOT VTI:    0.197 m  AORTA Ao Root diam: 3.80 cm Ao Asc diam:  3.50 cm MITRAL VALVE MV Area (PHT): 4.89 cm    SHUNTS MV Decel Time: 155 msec    Systemic VTI:  0.20 m MV E velocity: 69.80 cm/s  Systemic Diam: 2.00 cm MV A velocity: 51.70 cm/s MV E/A ratio:  1.35 Loralie Champagne MD Electronically signed by Loralie Champagne MD Signature Date/Time: 03/17/2020/10:42:12 PM    Final    CT HEAD CODE STROKE WO CONTRAST  Result Date: 03/16/2020 CLINICAL DATA:  Code stroke. Initial evaluation for acute neuro deficit, stroke suspected. EXAM: CT HEAD WITHOUT CONTRAST TECHNIQUE: Contiguous axial images were obtained from the base of the skull through the vertex without  intravenous contrast. COMPARISON:  None. FINDINGS: Brain: Cerebral volume within normal limits for patient age. No acute intracranial hemorrhage. There is subtle hypodensity involving the mid and posterior left frontal cortex with involvement of the precentral gyrus,, suspicious for evolving acute left MCA distribution infarct (series 2, image 25). No other definite large vessel territory infarct. No mass lesion, midline shift or mass effect. No hydrocephalus or extra-axial fluid collection. Vascular: No visible hyperdense vessel. Skull: Scalp soft tissues and calvarium within normal limits. Sinuses/Orbits: Globes and orbital soft tissues within normal limits. Paranasal sinuses are clear. No mastoid effusion. Other: None. ASPECTS [facility] Stroke Program Early CT Score) - Ganglionic level infarction (caudate, lentiform nuclei, internal capsule, insula, M1-M3 cortex): 7 - Supraganglionic infarction (M4-M6 cortex): 1 Total score (0-10 with 10 being normal): 8 IMPRESSION: 1. Subtle evolving hypodensity involving the supra ganglionic mid and posterior left frontal lobe, concerning for acute left MCA distribution infarct. No intracranial hemorrhage. 2. ASPECTS is 8. Critical Value/emergent results were called by telephone at the time of interpretation on 03/16/2020 at 10:45 pm to provider Dr. Cheral Marker, who verbally acknowledged these results. Electronically Signed   By:  Jeannine Boga M.D.   On: 03/16/2020 23:00   VAS [place] LOWER EXTREMITY VENOUS (DVT)  Result Date: 03/17/2020  Lower Venous DVT Study Other Indications: Embolic stroke. Risk Factors: None identified. Comparison Study: No previous exam Performing Technologist: Vonzell Schlatter RVT  Examination Guidelines: A complete evaluation includes B-mode imaging, spectral Doppler, color Doppler, and power Doppler as needed of all accessible portions of each vessel. Bilateral testing is considered an integral part of a complete examination. Limited examinations for  reoccurring indications may be performed as noted. The reflux portion of the exam is performed with the patient in reverse Trendelenburg.  +---------+---------------+---------+-----------+----------+--------------+ RIGHT    CompressibilityPhasicitySpontaneityPropertiesThrombus Aging +---------+---------------+---------+-----------+----------+--------------+ CFV      Full           Yes      Yes                                 +---------+---------------+---------+-----------+----------+--------------+ SFJ      Full                                                        +---------+---------------+---------+-----------+----------+--------------+ FV Prox  Full                                                        +---------+---------------+---------+-----------+----------+--------------+ FV Mid   Full                                                        +---------+---------------+---------+-----------+----------+--------------+ FV DistalFull                                                        +---------+---------------+---------+-----------+----------+--------------+ PFV      Full                                                        +---------+---------------+---------+-----------+----------+--------------+ POP      Full           Yes      Yes                                 +---------+---------------+---------+-----------+----------+--------------+ PTV      Full                                                        +---------+---------------+---------+-----------+----------+--------------+ PERO  Full                                                        +---------+---------------+---------+-----------+----------+--------------+   +---------+---------------+---------+-----------+----------+--------------+ LEFT     CompressibilityPhasicitySpontaneityPropertiesThrombus Aging  +---------+---------------+---------+-----------+----------+--------------+ CFV      Full           Yes      Yes                                 +---------+---------------+---------+-----------+----------+--------------+ SFJ      Full                                                        +---------+---------------+---------+-----------+----------+--------------+ FV Prox  Full                                                        +---------+---------------+---------+-----------+----------+--------------+ FV Mid   Full                                                        +---------+---------------+---------+-----------+----------+--------------+ FV DistalFull                                                        +---------+---------------+---------+-----------+----------+--------------+ PFV      Full                                                        +---------+---------------+---------+-----------+----------+--------------+ POP      Full           Yes      Yes                                 +---------+---------------+---------+-----------+----------+--------------+ PTV      Full                                                        +---------+---------------+---------+-----------+----------+--------------+ PERO     Full                                                        +---------+---------------+---------+-----------+----------+--------------+  Summary: BILATERAL: - No evidence of deep vein thrombosis seen in the lower extremities, bilaterally. -No evidence of popliteal cyst, bilaterally.   *See table(s) above for measurements and observations. Electronically signed by Servando Snare MD on 03/17/2020 at 1:58:32 PM.    Final    CT ANGIO HEAD CODE STROKE  Result Date: 03/16/2020 CLINICAL DATA:  Initial evaluation for acute stroke, receptive and expressive aphasia. EXAM: CT ANGIOGRAPHY HEAD AND NECK TECHNIQUE: Multidetector CT imaging of the  head and neck was performed using the standard protocol during bolus administration of intravenous contrast. Multiplanar CT image reconstructions and MIPs were obtained to evaluate the vascular anatomy. Carotid stenosis measurements (when applicable) are obtained utilizing NASCET criteria, using the distal internal carotid diameter as the denominator. CONTRAST:  129mL OMNIPAQUE IOHEXOL 350 MG/ML SOLN COMPARISON:  Prior head CT from earlier the same day. FINDINGS: CTA NECK FINDINGS Aortic arch: Visualized aortic arch of normal caliber with normal branch pattern. No hemodynamically significant stenosis seen about the origin of the great vessels. Right carotid system: Right common and internal carotid arteries patent without stenosis, dissection or occlusion. Left carotid system: Left common and internal carotid arteries patent without stenosis, dissection or occlusion. Vertebral arteries: Both vertebral arteries arise from the subclavian arteries. Vertebral arteries widely patent without stenosis, dissection or occlusion. Skeleton: No visible acute osseous abnormality. No discrete or worrisome osseous lesions. Other neck: No other acute soft tissue abnormality within the neck. No mass or adenopathy. Upper chest: Small layering right pleural effusion noted. Partially visualized lungs and upper chest demonstrate no other acute finding. Review of the MIP images confirms the above findings CTA HEAD FINDINGS Anterior circulation: Both internal carotid arteries patent to the termini without stenosis. A1 segments patent bilaterally. Normal anterior communicating artery complex. Anterior cerebral arteries patent to their distal aspects without stenosis. No M1 stenosis or occlusion. Normal MCA bifurcations. No proximal MCA branch occlusion. Mild irregularity of distal left MCA branches noted, which could be related to recent recanalization. No visible proximal branch occlusion. Posterior circulation: Both V4 segments patent to  the vertebrobasilar junction without stenosis. Both PICA origins patent and normal. Basilar patent to its distal aspect without stenosis. Superior cerebellar arteries patent bilaterally. Right PCA supplied via the basilar. Predominant fetal type origin of the left PCA. Both PCAs well perfused to their distal aspects without stenosis. Venous sinuses: Patent. Anatomic variants: Fetal type origin left PCA.  No aneurysm. Review of the MIP images confirms the above findings IMPRESSION: 1. Negative CTA for large vessel occlusion. No high-grade or correctable stenosis identified. 2. Mild irregularity of distal left MCA branches, which could be related to recent recanalization. 3. Small layering right pleural effusion. Critical Value/emergent results were called by telephone at the time of interpretation on 03/16/2020 at 10:55 pm to provider ERIC [facility] , who verbally acknowledged these results. Electronically Signed   By: Jeannine Boga M.D.   On: 03/16/2020 23:23    Assessment/Plan: Diagnosis: Multifocal acute ischemic infarcts in left cerebral hemisphere  Stroke: Continue secondary stroke prophylaxis and Risk Factor Modification listed below:   Antiplatelet therapy: ASA/Plavix Blood Pressure Management:  Continue current medication with prn's with permisive HTN per primary team Statin Agent:   Right sided hemiparesis: fit for orthosis to prevent contractures (resting hand splint for day, wrist cock up splint at night, PRAFO, etc) PT/OT for mobility, ADL training  Motor recovery: Zoloft Labs and images (see above) independently reviewed.  Records reviewed and summated above.  1. Does the need for close,  24 hr/day medical supervision in concert with the patient's rehab needs make it unreasonable for this patient to be served in a less intensive setting? Yes  2. Co-Morbidities requiring supervision/potential complications: depression (ensure mood does not hinder progress of therapies), hyperglycemia  (Monitor in accordance with exercise and adjust meds as necessary, SSI), elevated blood pressure (monitor and provide prns in accordance with increased physical exertion and pain), 3. Due to bladder management, safety, disease management, medication administration and patient education, does the patient require 24 hr/day rehab nursing? Yes 4. Does the patient require coordinated care of a physician, rehab nurse, therapy disciplines of PT/OT/SLP to address physical and functional deficits in the context of the above medical diagnosis(es)? Yes Addressing deficits in the following areas: balance, endurance, locomotion, strength, transferring, bathing, dressing, toileting, cognition, speech, language and psychosocial support 5. Can the patient actively participate in an intensive therapy program of at least 3 hrs of therapy per day at least 5 days per week? Yes 6. The potential for patient to make measurable gains while on inpatient rehab is excellent 7. Anticipated functional outcomes upon discharge from inpatient rehab are supervision and min assist  with PT, supervision and min assist with OT, supervision with SLP. 8. Estimated rehab length of stay to reach the above functional goals is: 22-26 days. 9. Anticipated discharge destination: TBD 10. Overall Rehab/Functional Prognosis: good  RECOMMENDATIONS: This patient's condition is appropriate for continued rehabilitative care in the following setting: CIR after completion of medical work-up if adequate caregiver support available upon discharge. Patient has agreed to participate in recommended program. Potentially Note that insurance prior authorization may be required for reimbursement for recommended care.  Comment: Rehab Admissions Coordinator to follow up.  I have personally performed a face to face diagnostic evaluation, including, but not limited to relevant history and physical exam findings, of this patient and developed relevant assessment  and plan.  Additionally, I have reviewed and concur with the physician assistant's documentation above.   Delice Lesch, MD, ABPMR Bary Leriche, PA-C 03/18/2020

## 2020-03-18 NOTE — Progress Notes (Addendum)
STROKE TEAM PROGRESS NOTE   INTERVAL HISTORY No acute overnight events. Patient evaluated at bedside this morning, no family present in the room.  Patient is alert and awake, unable to assess orientation.  Patient still has significant expressive aphasia but makes some mumbling sounds, at times can say a few words.  Patient still has urinary retention.PT/OT recommended CIR. Patient is advised to drink and eat well and he shows good understanding about it. PWaiting for hypercoagulable panel and TEE scheduled for 03/22/2019.  TCD bubble study scheduled for this afternoon.  Blood pressure well controlled.  Neurological exam is improving.  Vitals:   03/18/20 0521 03/18/20 0550 03/18/20 0916 03/18/20 1258  BP: 125/89 137/78 (!) 138/98 (!) 147/101  Pulse: (!) 59 (!) 56 72 79  Resp: 18 17 17 19   Temp: 98.5 F (36.9 C)  98.7 F (37.1 C) 98.6 F (37 C)  TempSrc: Oral  Oral Oral  SpO2: 96% 95% 95% 94%  Weight:       CBC:  Recent Labs  Lab 03/16/20 2220 03/16/20 2233 03/18/20 0718  WBC 15.1*  --  10.4  NEUTROABS 12.4*  --   --   HGB 15.6 15.6 14.6  HCT 44.6 46.0 43.8  MCV 87.8  --  87.8  PLT 295  --  161   Basic Metabolic Panel:  Recent Labs  Lab 03/16/20 2220 03/16/20 2233 03/18/20 0718  NA 141 142 137  K 3.4* 3.4* 3.8  CL 105 108 103  CO2 22  --  22  GLUCOSE 116* 109* 117*  BUN 14 15 10   CREATININE 1.08 0.90 1.04  CALCIUM 9.7  --  9.3   Lipid Panel:  Recent Labs  Lab 03/17/20 0344  CHOL 212*  TRIG 113  HDL 40*  CHOLHDL 5.3  VLDL 23  LDLCALC 149*   HgbA1c:  Recent Labs  Lab 03/17/20 0344  HGBA1C 5.1   Urine Drug Screen: Positive for THC  IMAGING past 24 hours  CT Head code stroke wo contrast 03/16/2020  IMPRESSION: 1. Subtle evolving hypodensity involving the supra ganglionic mid and posterior left frontal lobe, concerning for acute left MCA distribution infarct. No intracranial hemorrhage. 2. ASPECTS is 8.  CT Angio Head Neck w or wo  contrast 03/16/2020  IMPRESSION: 1. Negative CTA for large vessel occlusion. No high-grade or correctable stenosis identified. 2. Mild irregularity of distal left MCA branches, which could be related to recent recanalization. 3. Small layering right pleural effusion.  MR Brain wo contrast 03/17/2020  Patchy multifocal areas of restricted diffusion seen involving the cortical and subcortical aspect of the left frontal and parietal lobes as well as the left temporal occipital region, consistent with acute ischemic infarcts, left MCA distribution. Associated mild scattered petechial hemorrhage without frank hemorrhagic transformation. No significant regional mass effect.  VAS [place] Lower extremity 03/17/2020  Summary:  BILATERAL:  - No evidence of deep vein thrombosis seen in the lower extremities,  bilaterally.  -No evidence of popliteal cyst, bilaterally.   Echocardiogram 03/17/2020  1. Left ventricular ejection fraction, by estimation, is 60 to 65%. The  left ventricle has normal function. The left ventricle has no regional  wall motion abnormalities. Left ventricular diastolic parameters were  normal.  2. Right ventricular systolic function is normal. The right ventricular  size is normal. Tricuspid regurgitation signal is inadequate for assessing  PA pressure.  3. The mitral valve is normal in structure. No evidence of mitral valve  regurgitation. No evidence of mitral stenosis.  4. The aortic valve is tricuspid. Aortic valve regurgitation is not  visualized. No aortic stenosis is present.  5. Aortic dilatation noted. There is mild dilatation of the aortic root,  measuring 38 mm.  6. The inferior vena cava is normal in size with greater than 50%  respiratory variability, suggesting right atrial pressure of 3 mmHg.   PHYSICAL EXAM  General: Well-developed, well-nourished Caucasian male sitting comfortably in bed, NAD HEENT: [place]/AT, MMM, PERRLA Cardiovascular:  Regular rate and rhythm Respiratory: No respiratory distress Abdomen: Soft, nondistended, no tenderness or rigidity Psych: Normal mood and affect Neurological:  Mental Status: Alert, awake, unable to assess orientation.  Expressive aphasia, not able to name, repeat, makes an meaningful sounds.  Able to follow midline commands and peripheral commands. Cranial Nerves: II:  V right hemianopia, pupils equal, round, reactive to light and accommodation III,IV, VI: ptosis not present, extra-ocular motions intact bilaterally V,VII: smile asymmetric, right nasolabial fold flattening facial light touch sensation normal bilaterally VIII: hearing normal bilaterally IX,X: uvula rises symmetrically XI: bilateral shoulder shrug XII: midline tongue extension without atrophy or fasciculations  Motor: Right : Upper extremity   4/5    left:     Upper extremity   5/5  Lower extremity   4/5     Lower extremity   5/5 Tone and bulk:normal tone throughout; no atrophy noted Sensory: Assess Deep Tendon Reflexes:  Right: Upper Extremity   Left: Upper extremity   biceps (C-5 to C-6) 2/4   biceps (C-5 to C-6) 2/4 tricep (C7) 2/4    triceps (C7) 2/4 Brachioradialis (C6) 2/4  Brachioradialis (C6) 2/4  Lower Extremity Lower Extremity  quadriceps (L-2 to L-4) 2/4   quadriceps (L-2 to L-4) 2/4 Achilles (S1) 2/4   Achilles (S1) 2/4  Plantars: Right: downgoing   Left: downgoing Cerebellar: Unable to access. Gait: Deferred   ASSESSMENT/PLAN [name] is a 45 y.o. male with unknown past medical history presented with acute onset of dense expressive and receptive aphasia with right-sided weakness.  CT head with subtle hypodensity in left MCA territory.  CTA of head and neck with no large vessel occlusion.  Patient received TPA at 10:40 PM on 03/16/2020.  MRA head shows patchy multifocal area of restricted diffusion seen involving the cortical and subcortical aspect of left frontal and parietal lobes as well as  left temporal occipital region, consistent with acute ischemic infarct, left MCA distribution.  Stroke: Acute ischemic infarct in left MCA distribution s/p tPA likely embolic in origin, unclear source at this point.  Code Stroke CT head: Subtle evolving hypodensity involving the supra ganglionic mid and posterior left frontal lobe, concerning for acute left MCA distribution infarct. ASPECTS 8.  CTA head & neck:  Negative CTA for large vessel occlusion. Mild irregularity of distal left MCA branches, which could be related to recent recanalization.  MRI: Patchy multifocal areas of restricted diffusion seen involving the cortical and subcortical aspect of the left frontal and parietal lobes as well as the left temporal occipital region, consistent with acute ischemic infarcts, left MCA distribution  Doppler US Lower extremities: No evidence of deep vein thrombosis  2D Echo:  EF 60 to 65%.  TEE pending  TCD bubble study: Positive with Grade 2 during rest and Grade 3 on valsalva  LDL 149  HgbA1c 5.1  UDS + THC  Hypercoagulable and autoimmune work up pending  VTE prophylaxis -Lovenox 40 mg  No antithrombotic prior to admission, now on aspirin 81 mg and Plavix 75mg   DAPT for 3 weeks and then ASA alone  Therapy recommendations: CIR  Disposition: Pending  PFO  TCD bubble study: Positive with Grade 2 during rest and Grade 3 on valsalva  Pending TEE 03/21/20 for further evaluation  May consider PFO closure if no other stroke source found  Hypertension  Home meds: None  Stable . SBP< 180/105 after tPA  . Long-term BP goal normotensive  Hyperlipidemia  Home meds: None  LDL 149, goal < 70  Start lipitor 80  Continue statin at discharge  Urinary retention   s/p in and out x 3  S/p foley catheter now  On urecholine 10mg  tid  Other Stroke Risk Factors  F/Hx of Stroke maternal grandfather and maternal aunts and uncles.  UDS + for THC, cessation education  provided  Other Active Problems  Depression  Hospital day # 1  ATTENDING NOTE: I reviewed above note and agree with the assessment and plan. Pt was seen and examined.   45 year old male with no known past medical history admitted for aphasia and right-sided weakness.  Status post TPA.  CT head concerning for subtle left MCA infarct.  CTA head neck showed no LVO.  MRI showed left MCA scattered moderate large infarct.  2D echo EF 60-65%, LE venous Doppler no DVT.  LDL 149, A1c 5.1.  UDS positive for THC. Hypercoagulable work up pending  wife at the bedside. Pt awake alert, still has dense expressive aphasia, not able to name or repeat, able to say only "yes" "no" "almost". Able to follow midline commands and peripheral commands on the hands but not feet. No gaze palsy, tracking bilaterally, however, he seems to have right upper quadrantanopia and simultagnosia. Right nasolabial fold flattening. Tongue midline. Left UE and LE 5/5, right UE 4/5, right LE 4/5 proximally but 3/5 distally. Sensation decreased on the right and coordination grossly intact b/l FTN, gait not tested.   Patient still has urinary retention, status post foley catheter today. Put on urecholine. Had TCD bubble study showed spencer degree II at rest and III with valsalva. Pending TEE 03/21/20. On DAPT and statin.  PT/OT recommend CIR.  Rosalin Hawking, MD PhD Stroke Neurology 03/18/2020 3:20 PM    To contact Stroke Continuity provider, please refer to [email or web address] After hours, contact General Neurology

## 2020-03-18 NOTE — Progress Notes (Signed)
Inpatient Rehab Admissions Coordinator Note:   Per therpy recommendations, pt was screened for CIR candidacy by Shann Medal, PT, DPT.  At this time we are recommending a CIR consult and I will place an order per our protocol.  Please contact me with questions.   Shann Medal, PT, DPT [phone] 03/18/20 9:48 AM

## 2020-03-18 NOTE — TOC CAGE-AID Note (Signed)
Transition of Care [facility]) - CAGE-AID Screening   Patient Details  Name: Cody Henson MRN: [id number] Date of Birth: Sep 01, 1975  [facility]) CM/SW Contact:    Pollie Friar, RN Phone Number: 03/18/2020, 3:05 PM   Clinical Narrative: Pt admitted to [facility] use. When asked about counseling resources, pt refused.     CAGE-AID Screening: Substance Abuse Screening unable to be completed due to: : Patient Refused  Have You Ever Felt You Ought to Cut Down on Your Drinking or Drug Use?: No Have People Annoyed You By Critizing Your Drinking Or Drug Use?: No Have You Felt Bad Or Guilty About Your Drinking Or Drug Use?: No Have You Ever Had a Drink or Used Drugs First Thing In The Morning to Steady Your Nerves or to Get Rid of a Hangover?: No CAGE-AID Score: 0

## 2020-03-19 LAB — LUPUS ANTICOAGULANT PANEL
DRVVT: 36.7 s (ref 0.0–47.0)
PTT Lupus Anticoagulant: 29.8 s (ref 0.0–51.9)

## 2020-03-19 LAB — BASIC METABOLIC PANEL
Anion gap: 10 (ref 5–15)
BUN: 13 mg/dL (ref 6–20)
CO2: 24 mmol/L (ref 22–32)
Calcium: 9 mg/dL (ref 8.9–10.3)
Chloride: 103 mmol/L (ref 98–111)
Creatinine, Ser: 0.96 mg/dL (ref 0.61–1.24)
GFR, Estimated: >60 mL/min (ref >60)
Glucose, Bld: 97 mg/dL (ref 70–99)
Potassium: 3.6 mmol/L (ref 3.5–5.1)
Sodium: 137 mmol/L (ref 135–145)

## 2020-03-19 LAB — CBC
HCT: 42.7 % (ref 39.0–52.0)
Hemoglobin: 14.3 g/dL (ref 13.0–17.0)
MCH: 29.4 pg (ref 26.0–34.0)
MCHC: 33.5 g/dL (ref 30.0–36.0)
MCV: 87.7 fL (ref 80.0–100.0)
Platelets: 284 10*3/uL (ref 150–400)
RBC: 4.87 MIL/uL (ref 4.22–5.81)
RDW: 12.6 % (ref 11.5–15.5)
WBC: 8.8 10*3/uL (ref 4.0–10.5)
nRBC: 0 % (ref 0.0–0.2)

## 2020-03-19 LAB — HOMOCYSTEINE: Homocysteine: 7.4 umol/L (ref 0.0–14.5)

## 2020-03-19 MED ORDER — SERTRALINE HCL 50 MG PO TABS
50.0000 mg | ORAL_TABLET | Freq: Every day | ORAL | Status: DC
  Administered 2020-03-20 – 2020-03-23 (×4): 50 mg via ORAL
  Filled 2020-03-19 (×4): qty 1

## 2020-03-19 NOTE — Progress Notes (Addendum)
STROKE TEAM PROGRESS NOTE   INTERVAL HISTORY No acute overnight events.   Patient evaluated at bedside this morning, no family present in the room.  Patient is alert, awake, oriented today.  He still has significant expressive aphasia but it is improving and he is able to state his name and few other words today clearly.  Patient still has urinary retention.  Blood pressure well controlled.  Neurologically patient is improving.  Plan is to wait for CIR bed and patient is scheduled for TEE on 03/22/2019.  Vitals:   03/18/20 2024 03/19/20 0024 03/19/20 0324 03/19/20 0824  BP: (!) 146/106 128/90 (!) 143/100 (!) 139/98  Pulse: 64 74 65 71  Resp: 13 12 13 19   Temp: 98.4 F (36.9 C) 98.4 F (36.9 C) 98.5 F (36.9 C) 98.6 F (37 C)  TempSrc: Oral Oral Oral Axillary  SpO2: 93% 96% 95% 95%  Weight:       CBC:  Recent Labs  Lab 03/16/20 2220 03/16/20 2233 03/18/20 0718 03/19/20 0130  WBC 15.1*  --  10.4 8.8  NEUTROABS 12.4*  --   --   --   HGB 15.6   < > 14.6 14.3  HCT 44.6   < > 43.8 42.7  MCV 87.8  --  87.8 87.7  PLT 295  --  245 284   < > = values in this interval not displayed.   Basic Metabolic Panel:  Recent Labs  Lab 03/18/20 0718 03/19/20 0130  NA 137 137  K 3.8 3.6  CL 103 103  CO2 22 24  GLUCOSE 117* 97  BUN 10 13  CREATININE 1.04 0.96  CALCIUM 9.3 9.0   Lipid Panel:  Recent Labs  Lab 03/17/20 0344  CHOL 212*  TRIG 113  HDL 40*  CHOLHDL 5.3  VLDL 23  LDLCALC 149*   HgbA1c:  Recent Labs  Lab 03/17/20 0344  HGBA1C 5.1   Urine Drug Screen: Positive for THC  IMAGING past 24 hours  CT Head code stroke wo contrast 03/16/2020  IMPRESSION: 1. Subtle evolving hypodensity involving the supra ganglionic mid and posterior left frontal lobe, concerning for acute left MCA distribution infarct. No intracranial hemorrhage. 2. ASPECTS is 8.  CT Angio Head Neck w or wo contrast 03/16/2020  IMPRESSION: 1. Negative CTA for large vessel occlusion. No  high-grade or correctable stenosis identified. 2. Mild irregularity of distal left MCA branches, which could be related to recent recanalization. 3. Small layering right pleural effusion.   MR Brain wo contrast 03/17/2020  Patchy multifocal areas of restricted diffusion seen involving the cortical and subcortical aspect of the left frontal and parietal lobes as well as the left temporal occipital region, consistent with acute ischemic infarcts, left MCA distribution. Associated mild scattered petechial hemorrhage without frank hemorrhagic transformation. No significant regional mass effect.  VAS [place] Lower extremity 03/17/2020  Summary:  BILATERAL:  - No evidence of deep vein thrombosis seen in the lower extremities,  bilaterally.  -No evidence of popliteal cyst, bilaterally.   Echocardiogram 03/17/2020  1. Left ventricular ejection fraction, by estimation, is 60 to 65%. The  left ventricle has normal function. The left ventricle has no regional  wall motion abnormalities. Left ventricular diastolic parameters were  normal.   2. Right ventricular systolic function is normal. The right ventricular  size is normal. Tricuspid regurgitation signal is inadequate for assessing  PA pressure.   3. The mitral valve is normal in structure. No evidence of mitral valve  regurgitation. No  evidence of mitral stenosis.   4. The aortic valve is tricuspid. Aortic valve regurgitation is not  visualized. No aortic stenosis is present.   5. Aortic dilatation noted. There is mild dilatation of the aortic root,  measuring 38 mm.   6. The inferior vena cava is normal in size with greater than 50%  respiratory variability, suggesting right atrial pressure of 3 mmHg.   PHYSICAL EXAM  General: Well-developed, well-nourished Caucasian male sitting comfortably in bed, NAD HEENT: [place]/AT, MMM, PERRLA Cardiovascular: Regular rate and rhythm Respiratory: No respiratory distress Abdomen: Soft,  nondistended, no tenderness or rigidity Psych: Normal mood and affect Neurological:  Mental Status: Alert, awake, unable to assess orientation.  Expressive aphasia, not able to name, repeat, makes an meaningful sounds.  Able to follow midline commands and peripheral commands. Cranial Nerves: II:  V right hemianopia, pupils equal, round, reactive to light and accommodation III,IV, VI: ptosis not present, extra-ocular motions intact bilaterally V,VII: smile asymmetric, right nasolabial fold flattening facial light touch sensation normal bilaterally VIII: hearing normal bilaterally IX,X: uvula rises symmetrically XI: bilateral shoulder shrug XII: midline tongue extension without atrophy or fasciculations  Motor: Right : Upper extremity   4/5    left:     Upper extremity   5/5  Lower extremity   4/5     Lower extremity   5/5 Tone and bulk:normal tone throughout; no atrophy noted Sensory: Assess Deep Tendon Reflexes:  Right: Upper Extremity   Left: Upper extremity   biceps (C-5 to C-6) 2/4   biceps (C-5 to C-6) 2/4 tricep (C7) 2/4    triceps (C7) 2/4 Brachioradialis (C6) 2/4  Brachioradialis (C6) 2/4  Lower Extremity Lower Extremity  quadriceps (L-2 to L-4) 2/4   quadriceps (L-2 to L-4) 2/4 Achilles (S1) 2/4   Achilles (S1) 2/4  Plantars: Right: downgoing   Left: downgoing Cerebellar: Unable to access. Gait: Deferred   ASSESSMENT/PLAN [name] is a 45 y.o. male with unknown past medical history presented with acute onset of dense expressive and receptive aphasia with right-sided weakness.  CT head with subtle hypodensity in left MCA territory.  CTA of head and neck with no large vessel occlusion.  Patient received TPA at 10:40 PM on 03/16/2020.  MRA head shows patchy multifocal area of restricted diffusion seen involving the cortical and subcortical aspect of left frontal and parietal lobes as well as left temporal occipital region, consistent with acute ischemic infarct, left  MCA distribution.  Stroke: Acute ischemic infarct in left MCA distribution s/p tPA likely embolic in origin, unclear source at this point. Code Stroke CT head: Subtle evolving hypodensity involving the supra ganglionic mid and posterior left frontal lobe, concerning for acute left MCA distribution infarct. ASPECTS 8. CTA head & neck:  Negative CTA for large vessel occlusion. Mild irregularity of distal left MCA branches, which could be related to recent recanalization. MRI: Patchy multifocal areas of restricted diffusion seen involving the cortical and subcortical aspect of the left frontal and parietal lobes as well as the left temporal occipital region, consistent with acute ischemic infarcts, left MCA distribution Doppler US Lower extremities: No evidence of deep vein thrombosis 2D Echo:  EF 60 to 65%. TEE pending TCD bubble study: Positive with Grade 2 during rest and Grade 3 on valsalva LDL 149 HgbA1c 5.1 UDS + THC Hypercoagulable and autoimmune work up pending VTE prophylaxis -Lovenox 40 mg No antithrombotic prior to admission, now on aspirin 81 mg and Plavix 75mg  DAPT for 3 weeks and  then ASA alone Therapy recommendations: CIR Disposition: Pending  PFO TCD bubble study: Positive with Grade 2 during rest and Grade 3 on valsalva Pending TEE 03/21/20 for further evaluation May consider PFO closure if no other stroke source found  Hypertension Home meds: None Stable SBP< 180/105 after tPA  Long-term BP goal normotensive  Hyperlipidemia Home meds: None LDL 149, goal < 70 Start lipitor 80 Continue statin at discharge  Urinary retention  s/p in and out x 3 S/p foley catheter now On urecholine 10mg  tid  Other Stroke Risk Factors F/Hx of Stroke maternal grandfather and maternal aunts and uncles. UDS + for THC, cessation education provided  Other Active Problems Depression  Hospital day # 2  ATTENDING NOTE: I reviewed above note and agree with the assessment and plan.  Pt was seen and examined.  Status post TPA  45 year old male with no known past medical history admitted for aphasia and right-sided weakness.  Status post TPA.  CT head concerning for subtle left MCA infarct.  CTA head neck showed no LVO.  MRI showed left MCA scattered moderate large infarct.  2D echo EF 60-65%, LE venous Doppler no DVT.  LDL 149, A1c 5.1.  UDS positive for THC. Hypercoagulable work up pending  The wife is at the bedside.  Overall things about the same.  There is mild aphasia.  Right upper extremity 4/5 but right hand 1-2/5.  Right lower extremity 4+.  Right field cut deficit seems getting smaller - mild.  Pending TEE 03/21/20. On DAPT and statin.  PT/OT recommend CIR.     To contact Stroke Continuity provider, please refer to [email or web address] After hours, contact General Neurology

## 2020-03-19 NOTE — Plan of Care (Signed)

## 2020-03-20 LAB — BETA-2-GLYCOPROTEIN I ABS, IGG/M/A
Beta-2 Glyco I IgG: <9 GPI IgG units (ref 0–20)
Beta-2-Glycoprotein I IgA: <9 GPI IgA units (ref 0–25)
Beta-2-Glycoprotein I IgM: <9 GPI IgM units (ref 0–32)

## 2020-03-20 LAB — CBC
HCT: 43.5 % (ref 39.0–52.0)
Hemoglobin: 14.8 g/dL (ref 13.0–17.0)
MCH: 29.5 pg (ref 26.0–34.0)
MCHC: 34 g/dL (ref 30.0–36.0)
MCV: 86.8 fL (ref 80.0–100.0)
Platelets: 272 10*3/uL (ref 150–400)
RBC: 5.01 MIL/uL (ref 4.22–5.81)
RDW: 12.6 % (ref 11.5–15.5)
WBC: 9.5 10*3/uL (ref 4.0–10.5)
nRBC: 0 % (ref 0.0–0.2)

## 2020-03-20 LAB — BASIC METABOLIC PANEL
Anion gap: 10 (ref 5–15)
BUN: 11 mg/dL (ref 6–20)
CO2: 23 mmol/L (ref 22–32)
Calcium: 9.1 mg/dL (ref 8.9–10.3)
Chloride: 101 mmol/L (ref 98–111)
Creatinine, Ser: 1.01 mg/dL (ref 0.61–1.24)
GFR, Estimated: >60 mL/min (ref >60)
Glucose, Bld: 114 mg/dL — ABNORMAL HIGH (ref 70–99)
Potassium: 3.9 mmol/L (ref 3.5–5.1)
Sodium: 134 mmol/L — ABNORMAL LOW (ref 135–145)

## 2020-03-20 LAB — CARDIOLIPIN ANTIBODIES, IGG, IGM, IGA
Anticardiolipin IgA: <9 APL U/mL (ref 0–11)
Anticardiolipin IgG: <9 GPL U/mL (ref 0–14)
Anticardiolipin IgM: <9 MPL U/mL (ref 0–12)

## 2020-03-20 MED ORDER — SODIUM CHLORIDE 0.9 % IV SOLN: INTRAVENOUS | Status: DC

## 2020-03-20 NOTE — Progress Notes (Addendum)
Inpatient Rehab Admissions Coordinator:   Met with patient at bedside to discuss potential CIR admission. Pt. Stated interest  I spoke with Pt.'s wife and she confirmed that they can provide 24/7 support at discharge. Will pursue for potential admit next week, pending bed availability.  Laura Staley, MS, CCC-SLP Rehab Admissions Coordinator  [phone] (celll) [phone] (office)  

## 2020-03-20 NOTE — H&P (View-Only) (Signed)
STROKE TEAM PROGRESS NOTE   INTERVAL HISTORY He reports things are going about the same with possible slight improvement in right upper extremity weakness.  Vitals:   03/20/20 0626 03/20/20 0630 03/20/20 0747 03/20/20 1104  BP:  125/90 (!) 151/89 138/86  Pulse: 63 (!) 58 66 76  Resp: 16 14 15 17   Temp:    97.7 F (36.5 C)  TempSrc:    Oral  SpO2: 94% 94% 95% 97%  Weight:       CBC:  Recent Labs  Lab 03/16/20 2220 03/16/20 2233 03/19/20 0130 03/20/20 0048  WBC 15.1*   < > 8.8 9.5  NEUTROABS 12.4*  --   --   --   HGB 15.6   < > 14.3 14.8  HCT 44.6   < > 42.7 43.5  MCV 87.8   < > 87.7 86.8  PLT 295   < > 284 272   < > = values in this interval not displayed.   Basic Metabolic Panel:  Recent Labs  Lab 03/19/20 0130 03/20/20 0048  NA 137 134*  K 3.6 3.9  CL 103 101  CO2 24 23  GLUCOSE 97 114*  BUN 13 11  CREATININE 0.96 1.01  CALCIUM 9.0 9.1   Lipid Panel:  Recent Labs  Lab 03/17/20 0344  CHOL 212*  TRIG 113  HDL 40*  CHOLHDL 5.3  VLDL 23  LDLCALC 149*   HgbA1c:  Recent Labs  Lab 03/17/20 0344  HGBA1C 5.1   Urine Drug Screen: Positive for THC  IMAGING past 24 hours  CT Head code stroke wo contrast 03/16/2020  IMPRESSION: 1. Subtle evolving hypodensity involving the supra ganglionic mid and posterior left frontal lobe, concerning for acute left MCA distribution infarct. No intracranial hemorrhage. 2. ASPECTS is 8.  CT Angio Head Neck w or wo contrast 03/16/2020  IMPRESSION: 1. Negative CTA for large vessel occlusion. No high-grade or correctable stenosis identified. 2. Mild irregularity of distal left MCA branches, which could be related to recent recanalization. 3. Small layering right pleural effusion.   MR Brain wo contrast 03/17/2020  Patchy multifocal areas of restricted diffusion seen involving the cortical and subcortical aspect of the left frontal and parietal lobes as well as the left temporal occipital region, consistent  with acute ischemic infarcts, left MCA distribution. Associated mild scattered petechial hemorrhage without frank hemorrhagic transformation. No significant regional mass effect.  VAS [place] Lower extremity 03/17/2020  Summary:  BILATERAL:  - No evidence of deep vein thrombosis seen in the lower extremities,  bilaterally.  -No evidence of popliteal cyst, bilaterally.   Echocardiogram 03/17/2020  1. Left ventricular ejection fraction, by estimation, is 60 to 65%. The  left ventricle has normal function. The left ventricle has no regional  wall motion abnormalities. Left ventricular diastolic parameters were  normal.   2. Right ventricular systolic function is normal. The right ventricular  size is normal. Tricuspid regurgitation signal is inadequate for assessing  PA pressure.   3. The mitral valve is normal in structure. No evidence of mitral valve  regurgitation. No evidence of mitral stenosis.   4. The aortic valve is tricuspid. Aortic valve regurgitation is not  visualized. No aortic stenosis is present.   5. Aortic dilatation noted. There is mild dilatation of the aortic root,  measuring 38 mm.   6. The inferior vena cava is normal in size with greater than 50%  respiratory variability, suggesting right atrial pressure of 3 mmHg.   PHYSICAL EXAM  General: Well-developed, well-nourished Caucasian male sitting comfortably in bed, NAD HEENT: [place]/AT, MMM, PERRLA Cardiovascular: Regular rate and rhythm Respiratory: No respiratory distress Abdomen: Soft, nondistended, no tenderness or rigidity Psych: Normal mood and affect Neurological:  Mental Status: Alert, awake, unable to assess orientation.  Expressive aphasia, not able to name, repeat, makes an meaningful sounds.  Able to follow midline commands and peripheral commands. Cranial Nerves: II:  V right hemianopia, pupils equal, round, reactive to light and accommodation III,IV, VI: ptosis not present, extra-ocular motions  intact bilaterally V,VII: smile asymmetric, right nasolabial fold flattening facial light touch sensation normal bilaterally VIII: hearing normal bilaterally IX,X: uvula rises symmetrically XI: bilateral shoulder shrug XII: midline tongue extension without atrophy or fasciculations  Motor: Right : Upper extremity   4/5    left:     Upper extremity   5/5  Lower extremity   4/5     Lower extremity   5/5 Tone and bulk:normal tone throughout; no atrophy noted Sensory: Assess Deep Tendon Reflexes:  Right: Upper Extremity   Left: Upper extremity   biceps (C-5 to C-6) 2/4   biceps (C-5 to C-6) 2/4 tricep (C7) 2/4    triceps (C7) 2/4 Brachioradialis (C6) 2/4  Brachioradialis (C6) 2/4  Lower Extremity Lower Extremity  quadriceps (L-2 to L-4) 2/4   quadriceps (L-2 to L-4) 2/4 Achilles (S1) 2/4   Achilles (S1) 2/4  Plantars: Right: downgoing   Left: downgoing Cerebellar: Unable to access. Gait: Deferred   ASSESSMENT/PLAN [name] is a 45 y.o. male with unknown past medical history presented with acute onset of dense expressive and receptive aphasia with right-sided weakness.  CT head with subtle hypodensity in left MCA territory.  CTA of head and neck with no large vessel occlusion.  Patient received TPA at 10:40 PM on 03/16/2020.  MRA head shows patchy multifocal area of restricted diffusion seen involving the cortical and subcortical aspect of left frontal and parietal lobes as well as left temporal occipital region, consistent with acute ischemic infarct, left MCA distribution.  Stroke: Acute ischemic infarct in left MCA distribution s/p tPA likely embolic in origin, unclear source at this point.  Code Stroke CT head: Subtle evolving hypodensity involving the supra ganglionic mid and posterior left frontal lobe, concerning for acute left MCA distribution infarct. ASPECTS 8.  CTA head & neck:  Negative CTA for large vessel occlusion. Mild irregularity of distal left MCA branches,  which could be related to recent recanalization.  MRI: Patchy multifocal areas of restricted diffusion seen involving the cortical and subcortical aspect of the left frontal and parietal lobes as well as the left temporal occipital region, consistent with acute ischemic infarcts, left MCA distribution  Doppler US Lower extremities: No evidence of deep vein thrombosis  2D Echo:  EF 60 to 65%.  TEE - planned for tomorrow 03/21/20 - will write NPO order  TCD bubble study: Positive with Grade 2 during rest and Grade 3 on valsalva  LDL 149  HgbA1c 5.1  UDS + THC  Hypercoagulable and autoimmune work up -> all are normal except ANA, IFA and Prothrombin Gene Mutation which are currently pending  VTE prophylaxis -Lovenox 40 mg  No antithrombotic prior to admission, now on aspirin 81 mg and Plavix 75mg  DAPT for 3 weeks and then ASA alone  Therapy recommendations: CIR  Disposition: Pending  PFO  TCD bubble study: Positive with Grade 2 during rest and Grade 3 on valsalva  Pending TEE 03/21/20 for further evaluation  May  consider PFO closure if no other stroke source found  Hypertension  Home meds: None  Stable . SBP< 180/105 after tPA  . Long-term BP goal normotensive  Hyperlipidemia  Home meds: None  LDL 149, goal < 70  Start lipitor 80  Continue statin at discharge  Urinary retention   s/p in and out x 3  S/p foley catheter now  On urecholine 10mg  tid  Other Stroke Risk Factors  F/Hx of Stroke maternal grandfather and maternal aunts and uncles.  UDS + for THC, cessation education provided  Other Active Problems  Depression  Hospital day # 3  ATTENDING NOTE: I reviewed above note and agree with the assessment and plan. Pt was seen and examined.  Status post TPA  45 year old male with no known past medical history admitted for aphasia and right-sided weakness.  Status post TPA.  CT head concerning for subtle left MCA infarct.  CTA head neck showed no  LVO.  MRI showed left MCA scattered moderate large infarct.  2D echo EF 60-65%, LE venous Doppler no DVT.  LDL 149, A1c 5.1.  UDS positive for THC. Hypercoagulable work up pending  The wife is at the bedside.  Overall things about the same.  There is mild aphasia and dysarthria.  Right upper extremity 4/5 but right hand 3/5.  Right lower extremity 4+.   Mild lower facial weakness/flattening of the nasolabial fold. Right field cut deficit seems getting smaller - mild.  Pending TEE 03/21/20. On DAPT and statin.  PT/OT recommend CIR.     To contact Stroke Continuity provider, please refer to [email or web address] After hours, contact General Neurology

## 2020-03-20 NOTE — Progress Notes (Signed)
Physical Therapy Treatment Patient Details Name: Cody Henson MRN: [id number] DOB: 1975-07-03 Today's Date: 03/20/2020    History of Present Illness Cody Henson is an 45 y.o. male presenting with acute onset of dense expressive and receptive aphasia with right sided weakness. MRI . Patchy multifocal acute ischemic infarcts involving the left cerebral hemisphere.  Associated mild scattered petechial hemorrhage without frank hemorrhagic transformation. No significant PMH.    PT Comments    Pt tolerates treatment well, ambulating for increased distances and performing gait and transfers with reduced assistance requirements. Pt continues to demonstrate impaired control of R side, with impaired foot placement during gait training and impaired ability to maintain grip on RW with UE. PT provides cues for gait technique with use of railing and pt will benefit from initiation of gait and transfers with a hemiwalker next session to improve independence and to reduce falls risk. PT continues to recommend CIR placement at this time.   Follow Up Recommendations  CIR     Equipment Recommendations  Other (comment);Wheelchair (measurements PT);Wheelchair cushion (measurements PT) Photographer)    Recommendations for Other Services       Precautions / Restrictions Precautions Precautions: Fall Restrictions Weight Bearing Restrictions: No    Mobility  Bed Mobility Overal bed mobility: Needs Assistance Bed Mobility: Supine to Sit     Supine to sit: Supervision;HOB elevated        Transfers Overall transfer level: Needs assistance Equipment used: Rolling walker (2 wheeled) Transfers: Sit to/from [facility] Sit to Stand: Min assist;Mod assist Stand pivot transfers: Mod assist       General transfer comment: modA initially, cues for hand placement and foot placement, progressing to minA after multiple transfer attempts this session. Pt with difficulty placing R hand on  RW  Ambulation/Gait Ambulation/Gait assistance: Mod assist;Min assist (modA 5' with RW, modA 12' forward and backward with railing, then 12' forward and backward twice consecutively with minA and use of railing on left) Gait Distance (Feet): 12 Feet (5', 12', 24') Assistive device: Rolling walker (2 wheeled) (railing) Gait Pattern/deviations: Step-to pattern;Decreased step length - right;Ataxic;Trendelenburg Gait velocity: reduced Gait velocity interpretation: <1.31 ft/sec, indicative of household ambulator General Gait Details: pt with short step-to gait, unable to maintain RUE on RW. PT shifts to having pt ambulate with UE support of railing. Pt initially demonstrates trendelenburg gait of R hip, corrects for this by leaning to R and pulling on rail to maintain balance. PT provides cues to lean left onto rail with improvement in balance and gait quality.   Stairs             Wheelchair Mobility    Modified Rankin (Stroke Patients Only) Modified Rankin (Stroke Patients Only) Pre-Morbid Rankin Score: No symptoms Modified Rankin: Moderately severe disability     Balance Overall balance assessment: Needs assistance Sitting-balance support: No upper extremity supported;Feet unsupported Sitting balance-Leahy Scale: Good Sitting balance - Comments: pt able to don socks with close supervision. requiring some assistance to don sock on R foot but not for balance   Standing balance support: Single extremity supported Standing balance-Leahy Scale: Poor Standing balance comment: minA with LUE support of RW or railing                            Cognition Arousal/Alertness: Awake/alert Behavior During Therapy: WFL for tasks assessed/performed Overall Cognitive Status: Impaired/Different from baseline Area of Impairment: Attention;Memory;Following commands;Safety/judgement;Awareness;Problem solving  Current Attention Level: Sustained Memory:  Decreased short-term memory Following Commands: Follows one step commands consistently Safety/Judgement: Decreased awareness of safety;Decreased awareness of deficits Awareness: Emergent Problem Solving: Slow processing;Requires verbal cues;Requires tactile cues;Difficulty sequencing        Exercises      General Comments General comments (skin integrity, edema, etc.): VSS on RA      Pertinent Vitals/Pain Pain Assessment: No/denies pain    Home Living   Living Arrangements: Spouse/significant other                  Prior Function            PT Goals (current goals can now be found in the care plan section) Acute Rehab PT Goals Patient Stated Goal: be independent again Progress towards PT goals: Progressing toward goals    Frequency    Min 4X/week      PT Plan Current plan remains appropriate    Co-evaluation              AM-PAC PT "6 Clicks" Mobility   Outcome Measure  Help needed turning from your back to your side while in a flat bed without using bedrails?: A Little Help needed moving from lying on your back to sitting on the side of a flat bed without using bedrails?: A Little Help needed moving to and from a bed to a chair (including a wheelchair)?: A Lot Help needed standing up from a chair using your arms (e.g., wheelchair or bedside chair)?: A Little Help needed to walk in hospital room?: A Lot Help needed climbing 3-5 steps with a railing? : A Lot 6 Click Score: 15    End of Session   Activity Tolerance: Patient tolerated treatment well Patient left: in chair;with call bell/phone within reach;with chair alarm set Nurse Communication: Mobility status PT Visit Diagnosis: Apraxia (R48.2);Other symptoms and signs involving the nervous system (R29.898);Hemiplegia and hemiparesis;Unsteadiness on feet (R26.81);Other abnormalities of gait and mobility (R26.89);Muscle weakness (generalized) (M62.81);Difficulty in walking, not elsewhere  classified (R26.2) Hemiplegia - Right/Left: Right Hemiplegia - caused by: Cerebral infarction     Time: 4193-7902 PT Time Calculation (min) (ACUTE ONLY): 1413 min  Charges:  $Gait Training: 8-22 mins $Therapeutic Activity: 8-22 mins                     Zenaida Niece, PT, DPT Acute Rehabilitation Pager: [phone]    Zenaida Niece 03/20/2020, 10:52 AM

## 2020-03-20 NOTE — PMR Pre-admission (Shared)
PMR Admission Coordinator Pre-Admission Assessment  Patient: Cody Henson is an 45 y.o., male MRN: [id number] DOB: [birth year] Height: 5\' 8"  (172.7 cm) Weight: 75.1 kg              Insurance Information HMO:     PPO:      PCP:      IPA:      80/20:      OTHER:  PRIMARY: Uninsured  SECONDARY:       Policy#:       Phone#:   Development worker, community:       Phone#:   The Engineer, petroleum" for patients in Inpatient Rehabilitation Facilities with attached "Privacy Act [place] Records" was provided and verbally reviewed with: N/A  Emergency Contact Information Contact Information     Name Relation Home Work [place] Spouse   [phone]      Current Medical History  Patient Admitting Diagnosis: CVA History of Present Illness: Cody Henson is a 45 year old right-handed male with history of depression maintained on Adderall as well as Zoloft.  Per chart review patient lives with spouse and 30-year-old child.  Two-level home 2 steps to entry.  Independent prior to admission.  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  Presented 03/16/2020 with right side weakness facial droop and aphasia.  Admission chemistries unremarkable except potassium 3.4, glucose 116, WBC 15,100, SARS coronavirus negative, urine drug screen positive marijuana.  Cranial CT scan showed subtle evolving hypodensity involving the supra ganglionic mid posterior left frontal lobe concerning for acute left MCA distribution infarction.  No hemorrhage noted.  CT angiogram head and neck negative for large vessel occlusion.  No high-grade or correctable stenosis identified.  Patient did receive TPA.  MRI showed patchy multifocal acute ischemic infarct involving the left cerebral hemisphere.  Associated mild scattered petechial hemorrhage without frank hemorrhagic transformation.  No significant regional mass-effect.  Echocardiogram with ejection fraction of 60 to 65% no wall motion  abnormalities.  Bilateral lower extremity Dopplers negative.  TEE positive for PFO with bubbles crossing the atrial septum within 1-2 heartbeats.  No current plan for PFO closure follow-up per cardiology services.  Placed on aspirin 81 mg daily and Plavix 75 mg daily for 3 weeks and aspirin alone.  Subcutaneous Lovenox for DVT prophylaxis.  Bouts of urinary retention placed on Urecholine.  Tolerating a regular consistency diet.  Due to patient's right side weakness and dysarthria he was admitted for a comprehensive rehab program.   Complete NIHSS TOTAL: 8 Glasgow Coma Scale Score: 15  Past Medical History  Past Medical History:  Diagnosis Date   Depressed     Family History  family history includes Stroke in his maternal aunt and paternal grandfather.  Prior Rehab/Hospitalizations:  Has the patient had prior rehab or hospitalizations prior to admission? No  Has the patient had major surgery during 100 days prior to admission? No  Current Medications   Current Facility-Administered Medications:    acetaminophen (TYLENOL) tablet 650 mg, 650 mg, Oral, Q4H PRN **OR** acetaminophen (TYLENOL) 160 MG/5ML solution 650 mg, 650 mg, Per Tube, Q4H PRN **OR** acetaminophen (TYLENOL) suppository 650 mg, 650 mg, Rectal, Q4H PRN, Kerney Elbe, MD   aspirin chewable tablet 81 mg, 81 mg, Oral, Daily, Kerney Elbe, MD, 81 mg at 03/23/20 0819   atorvastatin (LIPITOR) tablet 80 mg, 80 mg, Oral, Daily, Rosalin Hawking, MD, 80 mg at 03/23/20 0820   bethanechol (URECHOLINE) tablet 10 mg, 10 mg, Oral,  TID, Rosalin Hawking, MD, 10 mg at 03/23/20 0820   Chlorhexidine Gluconate Cloth 2 % PADS 6 each, 6 each, Topical, Daily, Kerney Elbe, MD, 6 each at 03/23/20 0820   clopidogrel (PLAVIX) tablet 75 mg, 75 mg, Oral, Daily, Dagar, Anjali, MD, 75 mg at 03/23/20 0820   enoxaparin (LOVENOX) injection 40 mg, 40 mg, Subcutaneous, Q24H, Dagar, Anjali, MD, 40 mg at 03/23/20 1334   labetalol (NORMODYNE) injection 5-10 mg, 5-10 mg,  Intravenous, Q2H PRN, Rosalin Hawking, MD   pantoprazole (PROTONIX) EC tablet 40 mg, 40 mg, Oral, Daily, Rosalin Hawking, MD, 40 mg at 03/23/20 0820   senna-docusate (Senokot-S) tablet 1 tablet, 1 tablet, Oral, QHS PRN, Kerney Elbe, MD, 1 tablet at 03/21/20 1557   sertraline (ZOLOFT) tablet 50 mg, 50 mg, Oral, Daily, [place], Kofi, MD, 50 mg at 03/23/20 [id number]  Patients Current Diet:  Diet Order             Diet regular Room service appropriate? 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  Independent  Indoor Mobility: Did the patient need assistance with walking from room to room (with or without device)? Independent  Stairs: Did the patient need assistance with internal or external stairs (with or without device)? Independent  Functional Cognition: Did the patient need help planning regular tasks such as shopping or remembering to take medications? Independent  Home Assistive Devices / Equipment Home Assistive Devices/Equipment: None Home Equipment: None  Prior Device Use: Indicate devices/aids used by the patient prior to current illness, exacerbation or injury? None of the above  Current Functional Level Cognition  Arousal/Alertness: Awake/alert Overall Cognitive Status: Impaired/Different from baseline Current Attention Level: Sustained Orientation Level: Oriented to person,Oriented to place,Oriented to situation Following Commands: Follows one step commands  consistently,Follows one step commands with increased time,Follows multi-step commands with increased time,Follows multi-step commands inconsistently Safety/Judgement: Decreased awareness of safety,Decreased awareness of deficits General Comments: pt needs redirection to R sided deficits at times. Has difficulty processing more than one instruction at a time or instructions while mobilizing Attention: Sustained Sustained Attention: Appears intact Memory:  (to be assessed) Awareness: Appears intact (for his verbal errors) Problem Solving: Appears intact Safety/Judgment: Appears intact (?? will continue to assess)    Extremity Assessment (includes Sensation/Coordination)  Upper Extremity Assessment: Defer to OT evaluation RUE Deficits / Details: Flexor synergy. Overshoots, undershoots. Severe dysmetria. RUE Sensation: decreased light touch,decreased proprioception RUE Coordination: decreased fine motor,decreased gross motor  Lower Extremity Assessment: RLE deficits/detail RLE Deficits / Details: moves against gravity weakly, incoordinated and motor apraxic.  Pt report numbness and does not have strong proprioceptive input.  Pt not aware of where is foot is in stance or the position of his knee in stance with visual confirmation. RLE Sensation: decreased proprioception RLE Coordination: decreased fine motor    ADLs  Overall ADL's : Needs assistance/impaired Eating/Feeding: Moderate assistance Eating/Feeding Details (indicate cue type and reason): Unable to use RUE functionally during  self-feeding. Uses non-dominant LUE. patient unaware of food on R side of face 2/2 decreased sensation. Grooming: Wash/dry hands,Wash/dry face,Moderate assistance,Sitting Grooming Details (indicate cue type and reason): increased time, mod A for assist with RUE and cues how to use LUE to assist RUE Upper Body Bathing: Moderate assistance,Sitting Lower Body Bathing: Moderate assistance,Sit to/from stand Lower  Body Bathing Details (indicate cue type and reason): can perform figure 4 on L but not R Upper Body Dressing : Moderate assistance,Sitting Lower Body Dressing: Moderate assistance Lower Body Dressing Details (indicate cue type and reason): con don sock on L foot but not right, did attempt to engage RUE with cues Toilet Transfer: Moderate assistance,+2 for physical assistance,+2 for safety/equipment Toilet Transfer Details (indicate cue type and reason): 2 person HHA. blocking for LLE and support for RUE Toileting- Clothing Manipulation and Hygiene: Maximal assistance Functional mobility during ADLs: Moderate assistance,+2 for physical assistance,+2 for safety/equipment (2 person HHA)    Mobility  Overal bed mobility: Needs Assistance Bed Mobility: Supine to Sit,Rolling Rolling: Supervision Supine to sit: Supervision Sit to supine: Mod assist,+2 for safety/equipment (assist to manage RUE (to prevent flopping around) and RLE back into bed) General bed mobility comments: Pt rolled 1x each direction and transitioned supine > sit R EOB with bed flat and supervision for safety, extra time for all and assistance with covers management.    Transfers  Overall transfer level: Needs assistance Equipment used: Quad cane Transfers: Sit to/from Stand Sit to Stand: Min assist Stand pivot transfers: Mod assist General transfer comment: Cued pt to review "stop sign" pose of R elbow extension with writst extension several times prior to sit to stand to prepare for push-up movement of arms on bed. Cued pt to match R foot placement to L, needing extra time and minA for steadying to come to stand.    Ambulation / Gait / Stairs / Wheelchair Mobility  Ambulation/Gait Ambulation/Gait assistance: Herbalist (Feet): 200 Feet Assistive device: Quad cane Gait Pattern/deviations: Step-to pattern,Ataxic,Step-through pattern,Decreased step length - left,Decreased stance time - right,Decreased  dorsiflexion - right,Decreased weight shift to right General Gait Details: Pt ambulates with quad-cane in L hand, cuing pt initially for 3-point gait pattern with improved success and ease with increased distance. Pt displays step-to pattern with decreased R stance and weight shift thus decreased L step length, cued to improve with mod success. Cued pt to perform knee and hip flexion with ankle dorsiflexion during swing for improved foot clearance and sequence heel-to-toe during stance, mod success. Cued pt to relax R arm by side, needing to hold PT's hand with assistance to keep it there. Difficulty combining multiple steps at once. Increased knee instability and trunk sway with fatigue. Gait velocity: reduced Gait velocity interpretation: <1.8 ft/sec, indicate of risk for recurrent falls    Posture / Balance Dynamic Sitting Balance Sitting balance - Comments: Sitting EOB no LOB, supervision for safety. Balance Overall balance assessment: Needs assistance Sitting-balance support: No upper extremity supported,Feet unsupported Sitting balance-Leahy Scale: Good Sitting balance - Comments: Sitting EOB no LOB, supervision for safety. Standing balance support: Single extremity supported Standing balance-Leahy Scale: Poor Standing balance comment: dependent on AD as well as external support    Special needs/care consideration Designated visitor [place] (from acute therapy documentation) Living Arrangements: Spouse/significant other  Lives With: Spouse Available Help at Discharge: Family,Available 24 hours/day Type of Home: House Home Layout: Two level Alternate Level Stairs-Number of Steps: flight Home Access:  Stairs to enter Entrance Stairs-Rails: Surveyor, mining of Steps: 10 Bathroom Shower/Tub: Multimedia programmer: Standard Home Care Services: No  Discharge Living Setting Plans for Discharge Living Setting: Patient's  home Type of Home at Discharge: House Discharge Home Layout: Two level,Able to live on main level with bedroom/bathroom Alternate Level Stairs-Rails: Right Alternate Level Stairs-Number of Steps: full flight Discharge Home Access: Stairs to enter Entrance Stairs-Rails: Right,Left,Can reach both Entrance Stairs-Number of Steps: 4 Discharge Bathroom Shower/Tub: Tub/shower unit Discharge Bathroom Toilet: Standard Discharge Bathroom Accessibility: Yes How Accessible: Accessible via walker Does the patient have any problems obtaining your medications?: No  Social/Family/Support Systems Patient Roles: Spouse Contact Information: [phone] Anticipated Caregiver: drayson latimer Anticipated Caregiver's Contact Information: [phone] Ability/Limitations of Caregiver: none Caregiver Availability: 24/7 Discharge Plan Discussed with Primary Caregiver: Yes Is Caregiver In Agreement with Plan?: Yes Does Caregiver/Family have Issues with Lodging/Transportation while Pt is in Rehab?: No   Goals Patient/Family Goal for Rehab: PT/OT Supervision, SLP Supervision/Min A Expected length of stay: 8-12 days Pt/Family Agrees to Admission and willing to participate: Yes Program Orientation Provided & Reviewed with Pt/Caregiver Including Roles  & Responsibilities: Yes   Decrease burden of Care through IP rehab admission: Specialzed equipment needs, Decrease number of caregivers and Patient/family education   Possible need for SNF placement upon discharge:not anticipated   Patient Condition: This patient's medical and functional status has changed since the consult dated: 03/18/2020  in which the Rehabilitation Physician determined and documented that the patient's condition is appropriate for intensive rehabilitative care in an inpatient rehabilitation facility. See "History of Present Illness" (above) for medical update. Functional changes are: Pt now min A with most ADLs, transfers, and ambulation.   Patient's medical and functional status update has been discussed with the Rehabilitation physician and patient remains appropriate for inpatient rehabilitation. Will admit to inpatient rehab today.  Preadmission Screen Completed By:  Genella Mech, CCC-SLP, 03/23/2020 2:37 PM ______________________________________________________________________   Discussed status with Dr. Posey Pronto on 03/23/20  at 1430 and received approval for admission today.  Admission Coordinator:  Genella Mech, time 1430/Date 03/23/20

## 2020-03-20 NOTE — Progress Notes (Signed)
STROKE TEAM PROGRESS NOTE   INTERVAL HISTORY He reports things are going about the same with possible slight improvement in right upper extremity weakness.  Vitals:   03/20/20 0626 03/20/20 0630 03/20/20 0747 03/20/20 1104  BP:  125/90 (!) 151/89 138/86  Pulse: 63 (!) 58 66 76  Resp: 16 14 15 17   Temp:    97.7 F (36.5 C)  TempSrc:    Oral  SpO2: 94% 94% 95% 97%  Weight:       CBC:  Recent Labs  Lab 03/16/20 2220 03/16/20 2233 03/19/20 0130 03/20/20 0048  WBC 15.1*   < > 8.8 9.5  NEUTROABS 12.4*  --   --   --   HGB 15.6   < > 14.3 14.8  HCT 44.6   < > 42.7 43.5  MCV 87.8   < > 87.7 86.8  PLT 295   < > 284 272   < > = values in this interval not displayed.   Basic Metabolic Panel:  Recent Labs  Lab 03/19/20 0130 03/20/20 0048  NA 137 134*  K 3.6 3.9  CL 103 101  CO2 24 23  GLUCOSE 97 114*  BUN 13 11  CREATININE 0.96 1.01  CALCIUM 9.0 9.1   Lipid Panel:  Recent Labs  Lab 03/17/20 0344  CHOL 212*  TRIG 113  HDL 40*  CHOLHDL 5.3  VLDL 23  LDLCALC 149*   HgbA1c:  Recent Labs  Lab 03/17/20 0344  HGBA1C 5.1   Urine Drug Screen: Positive for THC  IMAGING past 24 hours  CT Head code stroke wo contrast 03/16/2020  IMPRESSION: 1. Subtle evolving hypodensity involving the supra ganglionic mid and posterior left frontal lobe, concerning for acute left MCA distribution infarct. No intracranial hemorrhage. 2. ASPECTS is 8.  CT Angio Head Neck w or wo contrast 03/16/2020  IMPRESSION: 1. Negative CTA for large vessel occlusion. No high-grade or correctable stenosis identified. 2. Mild irregularity of distal left MCA branches, which could be related to recent recanalization. 3. Small layering right pleural effusion.   MR Brain wo contrast 03/17/2020  Patchy multifocal areas of restricted diffusion seen involving the cortical and subcortical aspect of the left frontal and parietal lobes as well as the left temporal occipital region, consistent  with acute ischemic infarcts, left MCA distribution. Associated mild scattered petechial hemorrhage without frank hemorrhagic transformation. No significant regional mass effect.  VAS [place] Lower extremity 03/17/2020  Summary:  BILATERAL:  - No evidence of deep vein thrombosis seen in the lower extremities,  bilaterally.  -No evidence of popliteal cyst, bilaterally.   Echocardiogram 03/17/2020  1. Left ventricular ejection fraction, by estimation, is 60 to 65%. The  left ventricle has normal function. The left ventricle has no regional  wall motion abnormalities. Left ventricular diastolic parameters were  normal.   2. Right ventricular systolic function is normal. The right ventricular  size is normal. Tricuspid regurgitation signal is inadequate for assessing  PA pressure.   3. The mitral valve is normal in structure. No evidence of mitral valve  regurgitation. No evidence of mitral stenosis.   4. The aortic valve is tricuspid. Aortic valve regurgitation is not  visualized. No aortic stenosis is present.   5. Aortic dilatation noted. There is mild dilatation of the aortic root,  measuring 38 mm.   6. The inferior vena cava is normal in size with greater than 50%  respiratory variability, suggesting right atrial pressure of 3 mmHg.   PHYSICAL EXAM  General: Well-developed, well-nourished Caucasian male sitting comfortably in bed, NAD HEENT: [place]/AT, MMM, PERRLA Cardiovascular: Regular rate and rhythm Respiratory: No respiratory distress Abdomen: Soft, nondistended, no tenderness or rigidity Psych: Normal mood and affect Neurological:  Mental Status: Alert, awake, unable to assess orientation.  Expressive aphasia, not able to name, repeat, makes an meaningful sounds.  Able to follow midline commands and peripheral commands. Cranial Nerves: II:  V right hemianopia, pupils equal, round, reactive to light and accommodation III,IV, VI: ptosis not present, extra-ocular motions  intact bilaterally V,VII: smile asymmetric, right nasolabial fold flattening facial light touch sensation normal bilaterally VIII: hearing normal bilaterally IX,X: uvula rises symmetrically XI: bilateral shoulder shrug XII: midline tongue extension without atrophy or fasciculations  Motor: Right : Upper extremity   4/5    left:     Upper extremity   5/5  Lower extremity   4/5     Lower extremity   5/5 Tone and bulk:normal tone throughout; no atrophy noted Sensory: Assess Deep Tendon Reflexes:  Right: Upper Extremity   Left: Upper extremity   biceps (C-5 to C-6) 2/4   biceps (C-5 to C-6) 2/4 tricep (C7) 2/4    triceps (C7) 2/4 Brachioradialis (C6) 2/4  Brachioradialis (C6) 2/4  Lower Extremity Lower Extremity  quadriceps (L-2 to L-4) 2/4   quadriceps (L-2 to L-4) 2/4 Achilles (S1) 2/4   Achilles (S1) 2/4  Plantars: Right: downgoing   Left: downgoing Cerebellar: Unable to access. Gait: Deferred   ASSESSMENT/PLAN [name] is a 45 y.o. male with unknown past medical history presented with acute onset of dense expressive and receptive aphasia with right-sided weakness.  CT head with subtle hypodensity in left MCA territory.  CTA of head and neck with no large vessel occlusion.  Patient received TPA at 10:40 PM on 03/16/2020.  MRA head shows patchy multifocal area of restricted diffusion seen involving the cortical and subcortical aspect of left frontal and parietal lobes as well as left temporal occipital region, consistent with acute ischemic infarct, left MCA distribution.  Stroke: Acute ischemic infarct in left MCA distribution s/p tPA likely embolic in origin, unclear source at this point.  Code Stroke CT head: Subtle evolving hypodensity involving the supra ganglionic mid and posterior left frontal lobe, concerning for acute left MCA distribution infarct. ASPECTS 8.  CTA head & neck:  Negative CTA for large vessel occlusion. Mild irregularity of distal left MCA branches,  which could be related to recent recanalization.  MRI: Patchy multifocal areas of restricted diffusion seen involving the cortical and subcortical aspect of the left frontal and parietal lobes as well as the left temporal occipital region, consistent with acute ischemic infarcts, left MCA distribution  Doppler US Lower extremities: No evidence of deep vein thrombosis  2D Echo:  EF 60 to 65%.  TEE - planned for tomorrow 03/21/20 - will write NPO order  TCD bubble study: Positive with Grade 2 during rest and Grade 3 on valsalva  LDL 149  HgbA1c 5.1  UDS + THC  Hypercoagulable and autoimmune work up -> all are normal except ANA, IFA and Prothrombin Gene Mutation which are currently pending  VTE prophylaxis -Lovenox 40 mg  No antithrombotic prior to admission, now on aspirin 81 mg and Plavix 75mg  DAPT for 3 weeks and then ASA alone  Therapy recommendations: CIR  Disposition: Pending  PFO  TCD bubble study: Positive with Grade 2 during rest and Grade 3 on valsalva  Pending TEE 03/21/20 for further evaluation  May  consider PFO closure if no other stroke source found  Hypertension  Home meds: None  Stable . SBP< 180/105 after tPA  . Long-term BP goal normotensive  Hyperlipidemia  Home meds: None  LDL 149, goal < 70  Start lipitor 80  Continue statin at discharge  Urinary retention   s/p in and out x 3  S/p foley catheter now  On urecholine 10mg  tid  Other Stroke Risk Factors  F/Hx of Stroke maternal grandfather and maternal aunts and uncles.  UDS + for THC, cessation education provided  Other Active Problems  Depression  Hospital day # 3  ATTENDING NOTE: I reviewed above note and agree with the assessment and plan. Pt was seen and examined.  Status post TPA  45 year old male with no known past medical history admitted for aphasia and right-sided weakness.  Status post TPA.  CT head concerning for subtle left MCA infarct.  CTA head neck showed no  LVO.  MRI showed left MCA scattered moderate large infarct.  2D echo EF 60-65%, LE venous Doppler no DVT.  LDL 149, A1c 5.1.  UDS positive for THC. Hypercoagulable work up pending  The wife is at the bedside.  Overall things about the same.  There is mild aphasia and dysarthria.  Right upper extremity 4/5 but right hand 3/5.  Right lower extremity 4+.   Mild lower facial weakness/flattening of the nasolabial fold. Right field cut deficit seems getting smaller - mild.  Pending TEE 03/21/20. On DAPT and statin.  PT/OT recommend CIR.     To contact Stroke Continuity provider, please refer to [email or web address] After hours, contact General Neurology

## 2020-03-20 NOTE — Anesthesia Preprocedure Evaluation (Addendum)
Anesthesia Evaluation  Patient identified by MRN, date of birth, ID band Patient awake    Reviewed: Allergy & Precautions, NPO status , Patient's Chart, lab work & pertinent test results  History of Anesthesia Complications Negative for: history of anesthetic complications  Airway Mallampati: I  TM Distance: >3 FB Neck ROM: Full    Dental  (+) Dental Advisory Given, Teeth Intact   Pulmonary neg pulmonary ROS,    Pulmonary exam normal        Cardiovascular negative cardio ROS Normal cardiovascular exam   '22 TTE - EF 60 to 65%. There is mild dilatation of the aortic root, measuring 38 mm.     Neuro/Psych PSYCHIATRIC DISORDERS Depression CVA, Residual Symptoms    GI/Hepatic negative GI ROS, Neg liver ROS,   Endo/Other   Na 134   Renal/GU negative Renal ROS     Musculoskeletal negative musculoskeletal ROS (+)   Abdominal   Peds  Hematology negative hematology ROS (+)   Anesthesia Other Findings Covid test negative   Reproductive/Obstetrics                            Anesthesia Physical Anesthesia Plan  ASA: IV  Anesthesia Plan: MAC   Post-op Pain Management:    Induction: Intravenous  PONV Risk Score and Plan: 1 and Propofol infusion and Treatment may vary due to age or medical condition  Airway Management Planned: Nasal Cannula and Natural Airway  Additional Equipment: None  Intra-op Plan:   Post-operative Plan:   Informed Consent: I have reviewed the patients History and Physical, chart, labs and discussed the procedure including the risks, benefits and alternatives for the proposed anesthesia with the patient or authorized representative who has indicated his/her understanding and acceptance.       Plan Discussed with: CRNA and Anesthesiologist  Anesthesia Plan Comments:        Anesthesia Quick Evaluation

## 2020-03-21 ENCOUNTER — Encounter (HOSPITAL_COMMUNITY)
Admission: EM | Disposition: A | Discharge status: Rehabilitation Facility | Payer: Self-pay | Source: Home / Self Care | Attending: Neurology

## 2020-03-21 ENCOUNTER — Inpatient Hospital Stay (HOSPITAL_COMMUNITY): Payer: 59

## 2020-03-21 ENCOUNTER — Inpatient Hospital Stay (HOSPITAL_COMMUNITY): Payer: 59 | Admitting: Anesthesiology

## 2020-03-21 ENCOUNTER — Encounter (HOSPITAL_COMMUNITY): Payer: Self-pay | Admitting: Neurology

## 2020-03-21 DIAGNOSIS — I639 Cerebral infarction, unspecified: Secondary | ICD-10-CM

## 2020-03-21 HISTORY — PX: TEE WITHOUT CARDIOVERSION: SHX5443

## 2020-03-21 HISTORY — PX: BUBBLE STUDY: SHX6837

## 2020-03-21 LAB — CBC
HCT: 44.5 % (ref 39.0–52.0)
Hemoglobin: 14.9 g/dL (ref 13.0–17.0)
MCH: 29.2 pg (ref 26.0–34.0)
MCHC: 33.5 g/dL (ref 30.0–36.0)
MCV: 87.3 fL (ref 80.0–100.0)
Platelets: 271 10*3/uL (ref 150–400)
RBC: 5.1 MIL/uL (ref 4.22–5.81)
RDW: 12.3 % (ref 11.5–15.5)
WBC: 12.3 10*3/uL — ABNORMAL HIGH (ref 4.0–10.5)
nRBC: 0 % (ref 0.0–0.2)

## 2020-03-21 LAB — ANTINUCLEAR ANTIBODIES, IFA: ANA Ab, IFA: NEGATIVE

## 2020-03-21 LAB — BASIC METABOLIC PANEL
Anion gap: 11 (ref 5–15)
BUN: 10 mg/dL (ref 6–20)
CO2: 24 mmol/L (ref 22–32)
Calcium: 9.2 mg/dL (ref 8.9–10.3)
Chloride: 102 mmol/L (ref 98–111)
Creatinine, Ser: 1.07 mg/dL (ref 0.61–1.24)
GFR, Estimated: >60 mL/min (ref >60)
Glucose, Bld: 103 mg/dL — ABNORMAL HIGH (ref 70–99)
Potassium: 3.8 mmol/L (ref 3.5–5.1)
Sodium: 137 mmol/L (ref 135–145)

## 2020-03-21 SURGERY — ECHOCARDIOGRAM, TRANSESOPHAGEAL
Anesthesia: Monitor Anesthesia Care

## 2020-03-21 MED ORDER — PROPOFOL 500 MG/50ML IV EMUL
INTRAVENOUS | Status: DC | PRN
  Administered 2020-03-21: 125 ug/kg/min via INTRAVENOUS

## 2020-03-21 MED ORDER — PROPOFOL 10 MG/ML IV BOLUS
INTRAVENOUS | Status: DC | PRN
  Administered 2020-03-21: 10 mg via INTRAVENOUS
  Administered 2020-03-21 (×3): 20 mg via INTRAVENOUS
  Administered 2020-03-21: 10 mg via INTRAVENOUS
  Administered 2020-03-21: 20 mg via INTRAVENOUS

## 2020-03-21 MED ORDER — BUTAMBEN-TETRACAINE-BENZOCAINE 2-2-14 % EX AERO
INHALATION_SPRAY | CUTANEOUS | Status: DC | PRN
  Administered 2020-03-21: 2 via TOPICAL

## 2020-03-21 MED ORDER — LACTATED RINGERS IV SOLN: INTRAVENOUS | Status: DC | PRN

## 2020-03-21 MED ORDER — DEXMEDETOMIDINE (PRECEDEX) IN NS 20 MCG/5ML (4 MCG/ML) IV SYRINGE
PREFILLED_SYRINGE | INTRAVENOUS | Status: DC | PRN
  Administered 2020-03-21 (×2): 8 ug via INTRAVENOUS
  Administered 2020-03-21: 4 ug via INTRAVENOUS

## 2020-03-21 NOTE — Anesthesia Postprocedure Evaluation (Signed)
Anesthesia Post Note  Patient: Cody Henson  Procedure(s) Performed: TRANSESOPHAGEAL ECHOCARDIOGRAM (TEE) (N/A ) BUBBLE STUDY     Patient location during evaluation: PACU Anesthesia Type: MAC Level of consciousness: awake and alert Pain management: pain level controlled Vital Signs Assessment: post-procedure vital signs reviewed and stable Respiratory status: spontaneous breathing, nonlabored ventilation and respiratory function stable Cardiovascular status: stable and blood pressure returned to baseline Anesthetic complications: no   No complications documented.  Last Vitals:  Vitals:   03/21/20 0849 03/21/20 0859  BP: 115/76 126/85  Pulse: 71 77  Resp: 20 15  Temp:    SpO2: 93% 93%    Last Pain:  Vitals:   03/21/20 0859  TempSrc:   PainSc: 0-No pain                 Audry Pili

## 2020-03-21 NOTE — Progress Notes (Signed)
Occupational Therapy Treatment Patient Details Name: Cody Henson MRN: [id number] DOB: March 21, 1975 Today's Date: 03/21/2020    History of present illness Cody Henson is an 45 y.o. male presenting with acute onset of dense expressive and receptive aphasia with right sided weakness. MRI . Patchy multifocal acute ischemic infarcts involving the left cerebral hemisphere.  Associated mild scattered petechial hemorrhage without frank hemorrhagic transformation. No significant PMH.   OT comments  OT treatment session with focus on self-care re-education and RUE NMR. Patient demonstrates RUE flexor synergy, decreased sensation, decreased GMC/FMC, and undershooting/overshooting during functional activities. Foam blocks and cup provided for grasp/release task in prep for ADLs. Patient with great difficulty completing tasks 2/2 deficits but remained motivated to try. Patient also given thera-putty demonstrating decreased motor planning. Patient would benefit from continued acute OT services to maximize safety and independence with self-care tasks. Continued recommendation for CIR.    Follow Up Recommendations  CIR    Equipment Recommendations  3 in 1 bedside commode;Other (comment)    Recommendations for Other Services Rehab consult    Precautions / Restrictions Precautions Precautions: Fall Restrictions Weight Bearing Restrictions: No       Mobility Bed Mobility Overal bed mobility: Needs Assistance             General bed mobility comments: Patient seated in recliner upon entry.  Transfers                      Balance     Sitting balance-Leahy Scale: Good       Standing balance-Leahy Scale: Poor                             ADL either performed or assessed with clinical judgement   ADL Overall ADL's : Needs assistance/impaired   Eating/Feeding Details (indicate cue type and reason): Unable to use RUE functionally during self-feeding. Uses non-dominant LUE.  patient unaware of food on R side of face 2/2 decreased sensation. Grooming: Wash/dry hands;Wash/dry face;Moderate assistance;Sitting                                       Vision       Perception     Praxis      Cognition Arousal/Alertness: Awake/alert Behavior During Therapy: WFL for tasks assessed/performed                           Following Commands: Follows one step commands consistently Safety/Judgement: Decreased awareness of safety;Decreased awareness of deficits Awareness: Emergent Problem Solving: Slow processing;Requires verbal cues;Requires tactile cues;Difficulty sequencing          Exercises     Shoulder Instructions       General Comments      Pertinent Vitals/ Pain       Pain Assessment: No/denies pain  Home Living                                          Prior Functioning/Environment              Frequency  Min 2X/week        Progress Toward Goals  OT Goals(current goals can now be found in the care plan section)  Progress towards OT goals: Progressing toward goals  Acute Rehab OT Goals Patient Stated Goal: be independent again OT Goal Formulation: With patient Time For Goal Achievement: 03/31/20 Potential to Achieve Goals: Good ADL Goals Pt Will Perform Eating: with adaptive utensils;sitting;with set-up Pt Will Perform Grooming: with modified independence;sitting Pt Will Perform Upper Body Dressing: sitting;with modified independence Pt Will Perform Lower Body Dressing: with min guard assist;sit to/from stand Pt Will Transfer to Toilet: with min guard assist;ambulating Pt Will Perform Toileting - Clothing Manipulation and hygiene: with modified independence;sitting/lateral leans Pt/caregiver will Perform Home Exercise Program: Right Upper extremity;Independently;With written HEP provided  Plan Discharge plan remains appropriate;Frequency remains appropriate    Co-evaluation                  AM-PAC OT "6 Clicks" Daily Activity     Outcome Measure   Help from another person eating meals?: A Little Help from another person taking care of personal grooming?: A Little Help from another person toileting, which includes using toliet, bedpan, or urinal?: A Lot Help from another person bathing (including washing, rinsing, drying)?: A Lot Help from another person to put on and taking off regular upper body clothing?: A Little Help from another person to put on and taking off regular lower body clothing?: A Lot 6 Click Score: 15    End of Session    OT Visit Diagnosis: Unsteadiness on feet (R26.81);Other abnormalities of gait and mobility (R26.89);Muscle weakness (generalized) (M62.81);Other symptoms and signs involving cognitive function;Other symptoms and signs involving the nervous system (R29.898);Cognitive communication deficit (R41.841);Hemiplegia and hemiparesis Symptoms and signs involving cognitive functions: Cerebral infarction;Nontraumatic intracerebral hemorrhage Hemiplegia - Right/Left: Right Hemiplegia - dominant/non-dominant: Dominant Hemiplegia - caused by: Nontraumatic intracerebral hemorrhage;Cerebral infarction   Activity Tolerance Patient tolerated treatment well   Patient Left in chair;with call bell/phone within reach   Nurse Communication          Time: 9937-1696 OT Time Calculation (min): 18 min  Charges: OT General Charges $OT Visit: 1 Visit OT Treatments $Therapeutic Activity: 8-22 mins  Tay Whitwell H. OTR/L Supplemental OT, Department of rehab services [phone]   Bekim Werntz R H. 03/21/2020, 1:27 PM

## 2020-03-21 NOTE — Progress Notes (Signed)
Physical Therapy Treatment Patient Details Name: Cody Henson MRN: [id number] DOB: 23-Sep-1975 Today's Date: 03/21/2020    History of Present Illness Cody Henson is an 45 y.o. male presenting with acute onset of dense expressive and receptive aphasia with right sided weakness. MRI . Patchy multifocal acute ischemic infarcts involving the left cerebral hemisphere.  Associated mild scattered petechial hemorrhage without frank hemorrhagic transformation. No significant PMH.    PT Comments    Pt making good progress, working hard to verbalize throughout session. Has more difficulty with word finding while mobilizing. Came to EOB with increased time but no physical assist. Min A to stand to L HW. Needed vc's for sequencing HW with stepping. Min A needed to ambulate 30'. Pt had difficulty with changing directions. Pt continues to be a good candidate for CIR. PT will continue to follow.    Follow Up Recommendations  CIR     Equipment Recommendations  Wheelchair (measurements PT);Wheelchair cushion (measurements PT) (TBD)    Recommendations for Other Services Rehab consult     Precautions / Restrictions Precautions Precautions: Fall Restrictions Weight Bearing Restrictions: No    Mobility  Bed Mobility Overal bed mobility: Needs Assistance Bed Mobility: Supine to Sit     Supine to sit: Supervision     General bed mobility comments: supervision from flat bed, has some difficulty managing R side but requiring increased time but no physical assist  Transfers Overall transfer level: Needs assistance Equipment used: Hemi-walker Transfers: Sit to/from Stand Sit to Stand: Min assist         General transfer comment: Min A with increased time needed to place R foot  Ambulation/Gait Ambulation/Gait assistance: Min assist Gait Distance (Feet): 30 Feet Assistive device: Hemi-walker Gait Pattern/deviations: Step-to pattern;Decreased step length - right;Ataxic Gait velocity:  reduced Gait velocity interpretation: <1.31 ft/sec, indicative of household ambulator General Gait Details: pt did well with hemiwalker L side, vc'd for sequencing. Expect quick progression to cane. Worked on turning with HW.   Stairs             Wheelchair Mobility    Modified Rankin (Stroke Patients Only) Modified Rankin (Stroke Patients Only) Pre-Morbid Rankin Score: No symptoms Modified Rankin: Moderately severe disability     Balance Overall balance assessment: Needs assistance Sitting-balance support: No upper extremity supported;Feet unsupported Sitting balance-Leahy Scale: Good     Standing balance support: Single extremity supported Standing balance-Leahy Scale: Poor Standing balance comment: minA with LUE support                            Cognition Arousal/Alertness: Awake/alert Behavior During Therapy: WFL for tasks assessed/performed Overall Cognitive Status: Impaired/Different from baseline Area of Impairment: Attention;Memory;Following commands;Safety/judgement;Awareness;Problem solving                   Current Attention Level: Sustained Memory: Decreased short-term memory Following Commands: Follows one step commands consistently Safety/Judgement: Decreased awareness of safety;Decreased awareness of deficits Awareness: Emergent Problem Solving: Slow processing;Requires verbal cues;Requires tactile cues;Difficulty sequencing General Comments: pt making really good effort to get words out, was able to order lunch with min A for 2 details.      Exercises      General Comments General comments (skin integrity, edema, etc.): Performed RUE [place] activities in sitting and against door and window at end of each lap around room.      Pertinent Vitals/Pain Pain Assessment: No/denies pain    Home Living  Prior Function            PT Goals (current goals can now be found in the care plan section)  Acute Rehab PT Goals Patient Stated Goal: be independent again PT Goal Formulation: With patient Time For Goal Achievement: 03/31/20 Potential to Achieve Goals: Good Progress towards PT goals: Progressing toward goals    Frequency    Min 4X/week      PT Plan Current plan remains appropriate    Co-evaluation              AM-PAC PT "6 Clicks" Mobility   Outcome Measure  Help needed turning from your back to your side while in a flat bed without using bedrails?: A Little Help needed moving from lying on your back to sitting on the side of a flat bed without using bedrails?: A Little Help needed moving to and from a bed to a chair (including a wheelchair)?: A Lot Help needed standing up from a chair using your arms (e.g., wheelchair or bedside chair)?: A Little Help needed to walk in hospital room?: A Lot Help needed climbing 3-5 steps with a railing? : A Lot 6 Click Score: 15    End of Session Equipment Utilized During Treatment: Gait belt Activity Tolerance: Patient tolerated treatment well Patient left: in chair;with call bell/phone within reach;with chair alarm set Nurse Communication: Mobility status PT Visit Diagnosis: Apraxia (R48.2);Other symptoms and signs involving the nervous system (R29.898);Hemiplegia and hemiparesis;Unsteadiness on feet (R26.81);Other abnormalities of gait and mobility (R26.89);Muscle weakness (generalized) (M62.81);Difficulty in walking, not elsewhere classified (R26.2) Hemiplegia - Right/Left: Right Hemiplegia - caused by: Cerebral infarction     Time: 6270-3500 PT Time Calculation (min) (ACUTE ONLY): 37 min  Charges:  $Gait Training: 23-37 mins                     [place]  Pager [phone] Office [place] 03/21/2020, 1:45 PM

## 2020-03-21 NOTE — Interval H&P Note (Signed)
History and Physical Interval Note:  03/21/2020 7:46 AM  Cody Henson  has presented today for surgery, with the diagnosis of STROKE.  The various methods of treatment have been discussed with the patient and family. After consideration of risks, benefits and other options for treatment, the patient has consented to  Procedure(s): TRANSESOPHAGEAL ECHOCARDIOGRAM (TEE) (N/A) as a surgical intervention.  The patient's history has been reviewed, patient examined, no change in status, stable for surgery.  I have reviewed the patient's chart and labs.  Questions were answered to the patient's satisfaction.     [name]

## 2020-03-21 NOTE — Progress Notes (Signed)
  Speech Language Pathology Treatment: Cognitive-Linquistic  Patient Details Name: Cody Henson MRN: [id number] DOB: [birth year] Today's Date: 03/21/2020 Time: 3300-7622 SLP Time Calculation (min) (ACUTE ONLY): 35 min  Assessment / Plan / Recommendation Clinical Impression  Pt presented with much improved speech since time of initial assessment. While there remains a receptive/expressive language component, time today was spent addressing apraxia.  Pt demonstrated difficulty sequencing single syllable words, particularly when sounds were more complex. Provided with single syllable consonant-vowel-consonant words, with focus on initial sounds that are more salient/visible (e.g., bat).  Pt practiced altering vowel (bat vs bit) requiring written and visual feedback for initial sound placement (clinician wore clear mask to allow for the benefit of visual speech cues.) We discussed the nature of apraxia, the benefit of working on fundamentals of sequencing, the difficulty of drilling when it can result in increased errors.  Pt and his wife asked appropriate questions - provided with a handout of appropriate words for reading aloud.  Pt will benefit from intensive speech therapy.  Will continue to follow while on acute care.   HPI HPI: Cody Henson is an 45 y.o. male presenting with acute onset of dense expressive and receptive aphasia with right sided weakness. MRI . Patchy multifocal acute ischemic infarcts involving the left cerebral hemisphere.  Associated mild scattered petechial hemorrhage without frank hemorrhagic transformation. No significant PMH.      SLP Plan  Continue with current plan of care       Recommendations   CIR                 Plan: Continue with current plan of care       GO                Assunta Curtis 03/21/2020, 2:57 PM  Drayden Lukas L. Tivis Ringer, [place] Office number [phone] Pager [phone]

## 2020-03-21 NOTE — CV Procedure (Signed)
INDICATIONS: Stroke  PROCEDURE:   Informed consent was obtained prior to the procedure. The risks, benefits and alternatives for the procedure were discussed and the patient comprehended these risks.  Risks include, but are not limited to, cough, sore throat, vomiting, nausea, somnolence, esophageal and stomach trauma or perforation, bleeding, low blood pressure, aspiration, pneumonia, infection, trauma to the teeth and death.    After a procedural time-out, the oropharynx was anesthetized with 20% benzocaine spray.   During this procedure the patient was administered propofol per anesthesia.  The patient's heart rate, blood pressure, and oxygen saturation were monitored continuously during the procedure. The period of conscious sedation was 30 minutes, of which I was present face-to-face 100% of this time.  The transesophageal probe was inserted in the esophagus and stomach without difficulty and multiple views were obtained.  The patient was kept under observation until the patient left the procedure room.  The patient left the procedure room in stable condition.   Agitated microbubble saline contrast was administered.  COMPLICATIONS:    There were no immediate complications. Patient was lightly sedated throughout procedure, coughing frequently making image acquisition somewhat challenging.   FINDINGS:   FORMAL ECHOCARDIOGRAM REPORT PENDING Normal biventricular size and function. No hemodynamically significant valvular heart disease.  No LA appendage thrombus. Positive PFO with bubbles crossing the atrial septum within 1-2 heart beats. Right to left shunt.  RECOMMENDATIONS:     Can return to hospital room when alert.   Time Spent Directly with the Patient:  30 minutes   Cody Henson 03/21/2020, 8:26 AM

## 2020-03-21 NOTE — Progress Notes (Addendum)
Inpatient Rehab Admissions Coordinator:   I met with Pt at bedside for ongoing discussion regarding potential CIR admit. Pt. Remains interested.  I am working on Ship broker I will continue to follow for potential admit later this week pending insurance authorization and bed availability.   Clemens Catholic, [place], [place] Admissions Coordinator  [phone] ([place]) [phone] (office)

## 2020-03-21 NOTE — Progress Notes (Addendum)
STROKE TEAM PROGRESS NOTE   INTERVAL HISTORY No acute overnight events. Patient evaluated at bedside this morning, no family present in the room.  Patient is alert, awake, cooperative with exam.  His aphasia is improving and he is able to express few words.  His TEE completed this morning and it shows positive PFO with bubbles crossing the atrial septum within 1-2 heartbeats.  Right to left shunt.  Patient denies any new complaints he is encouraged to work hard with physical therapy.  Blood pressure well controlled.  Neurologically patient is improving.  Vitals:   03/21/20 0849 03/21/20 0859 03/21/20 0930 03/21/20 1201  BP: 115/76 126/85 (!) 119/92 (!) 144/92  Pulse: 71 77 70 75  Resp: 20 15 19 19   Temp:   98.1 F (36.7 C) 98.4 F (36.9 C)  TempSrc:   Oral Oral  SpO2: 93% 93% 95% 97%  Weight:      Height:       CBC:  Recent Labs  Lab 03/16/20 2220 03/16/20 2233 03/20/20 0048 03/21/20 0207  WBC 15.1*   < > 9.5 12.3*  NEUTROABS 12.4*  --   --   --   HGB 15.6   < > 14.8 14.9  HCT 44.6   < > 43.5 44.5  MCV 87.8   < > 86.8 87.3  PLT 295   < > 272 271   < > = values in this interval not displayed.   Basic Metabolic Panel:  Recent Labs  Lab 03/20/20 0048 03/21/20 0207  NA 134* 137  K 3.9 3.8  CL 101 102  CO2 23 24  GLUCOSE 114* 103*  BUN 11 10  CREATININE 1.01 1.07  CALCIUM 9.1 9.2   Lipid Panel:  Recent Labs  Lab 03/17/20 0344  CHOL 212*  TRIG 113  HDL 40*  CHOLHDL 5.3  VLDL 23  LDLCALC 149*   HgbA1c:  Recent Labs  Lab 03/17/20 0344  HGBA1C 5.1   Urine Drug Screen: Positive for THC  IMAGING past 24 hours  CT Head code stroke wo contrast 03/16/2020  IMPRESSION: 1. Subtle evolving hypodensity involving the supra ganglionic mid and posterior left frontal lobe, concerning for acute left MCA distribution infarct. No intracranial hemorrhage. 2. ASPECTS is 8.  CT Angio Head Neck w or wo contrast 03/16/2020  IMPRESSION: 1. Negative CTA for large  vessel occlusion. No high-grade or correctable stenosis identified. 2. Mild irregularity of distal left MCA branches, which could be related to recent recanalization. 3. Small layering right pleural effusion.   MR Brain wo contrast 03/17/2020  Patchy multifocal areas of restricted diffusion seen involving the cortical and subcortical aspect of the left frontal and parietal lobes as well as the left temporal occipital region, consistent with acute ischemic infarcts, left MCA distribution. Associated mild scattered petechial hemorrhage without frank hemorrhagic transformation. No significant regional mass effect.  VAS [place] Lower extremity 03/17/2020  Summary:  BILATERAL:  - No evidence of deep vein thrombosis seen in the lower extremities,  bilaterally.  -No evidence of popliteal cyst, bilaterally.   Echocardiogram 03/17/2020  1. Left ventricular ejection fraction, by estimation, is 60 to 65%. The  left ventricle has normal function. The left ventricle has no regional  wall motion abnormalities. Left ventricular diastolic parameters were  normal.   2. Right ventricular systolic function is normal. The right ventricular  size is normal. Tricuspid regurgitation signal is inadequate for assessing  PA pressure.   3. The mitral valve is normal in structure. No  evidence of mitral valve  regurgitation. No evidence of mitral stenosis.   4. The aortic valve is tricuspid. Aortic valve regurgitation is not  visualized. No aortic stenosis is present.   5. Aortic dilatation noted. There is mild dilatation of the aortic root,  measuring 38 mm.   6. The inferior vena cava is normal in size with greater than 50%  respiratory variability, suggesting right atrial pressure of 3 mmHg.   PHYSICAL EXAM  General: Well-developed, well-nourished Caucasian male sitting comfortably in bed, NAD HEENT: [place]/AT, MMM, PERRLA Cardiovascular: Regular rate and rhythm Respiratory: No respiratory  distress Abdomen: Soft, nondistended, no tenderness or rigidity Psych: Normal mood and affect Neurological:  Mental Status: Alert, awake, unable to assess orientation.  Expressive aphasia, not able to name, repeat, but makes meaningful sounds.  Able to follow midline commands and peripheral commands.  Makes paraphasic errors.  Good comprehension Cranial Nerves: II:  V right hemianopia improved, pupils equal, round, reactive to light and accommodation III,IV, VI: ptosis not present, extra-ocular motions intact bilaterally V,VII: smile asymmetric, right nasolabial fold flattening facial light touch sensation normal bilaterally VIII: hearing normal bilaterally IX,X: uvula rises symmetrically XI: bilateral shoulder shrug XII: midline tongue extension without atrophy or fasciculations  Motor: Right : Upper extremity   4/5    left:     Upper extremity   5/5  Lower extremity   4/5     Lower extremity   5/5 Tone and bulk:normal tone throughout; no atrophy noted Sensory: Assess Deep Tendon Reflexes:  Right: Upper Extremity   Left: Upper extremity   biceps (C-5 to C-6) 2/4   biceps (C-5 to C-6) 2/4 tricep (C7) 2/4    triceps (C7) 2/4 Brachioradialis (C6) 2/4  Brachioradialis (C6) 2/4  Lower Extremity Lower Extremity  quadriceps (L-2 to L-4) 2/4   quadriceps (L-2 to L-4) 2/4 Achilles (S1) 2/4   Achilles (S1) 2/4  Plantars: Right: downgoing   Left: downgoing Cerebellar: Unable to access. Gait: Deferred   ASSESSMENT/PLAN [name] is a 45 y.o. male with unknown past medical history presented with acute onset of dense expressive and receptive aphasia with right-sided weakness.  CT head with subtle hypodensity in left MCA territory.  CTA of head and neck with no large vessel occlusion.  Patient received TPA at 10:40 PM on 03/16/2020.  MRA head shows patchy multifocal area of restricted diffusion seen involving the cortical and subcortical aspect of left frontal and parietal lobes as well  as left temporal occipital region, consistent with acute ischemic infarct, left MCA distribution.  Stroke: Acute ischemic infarct in left MCA distribution s/p tPA likely embolic in origin, unclear source at this point.  Code Stroke CT head: Subtle evolving hypodensity involving the supra ganglionic mid and posterior left frontal lobe, concerning for acute left MCA distribution infarct. ASPECTS 8.  CTA head & neck:  Negative CTA for large vessel occlusion. Mild irregularity of distal left MCA branches, which could be related to recent recanalization.  MRI: Patchy multifocal areas of restricted diffusion seen involving the cortical and subcortical aspect of the left frontal and parietal lobes as well as the left temporal occipital region, consistent with acute ischemic infarcts, left MCA distribution  Doppler US Lower extremities: No evidence of deep vein thrombosis  2D Echo:  EF 60 to 65%.  TEE - Positive PFO with bubbles crossing the atrial septum within 1-2 heart beats. Right to left shunt.  TCD bubble study: Positive with Grade 2 during rest and Grade 3  on valsalva  LDL 149  HgbA1c 5.1  UDS + THC  Hypercoagulable and autoimmune work up -> all are normal except  Prothrombin Gene Mutation which is currently pending  VTE prophylaxis -Lovenox 40 mg  No antithrombotic prior to admission, now on aspirin 81 mg and Plavix 75mg  DAPT for 3 weeks and then ASA alone  Therapy recommendations: CIR  Disposition: Pending  PFO  TCD bubble study: Positive with Grade 2 during rest and Grade 3 on valsalva   TEE : Positive PFO with bubbles crossing the atrial septum within 1-2 heart beats. Right to left shunt.  May consider PFO closure if no other stroke source found  Hypertension  Home meds: None  Stable . SBP< 180/105 after tPA  . Long-term BP goal normotensive  Hyperlipidemia  Home meds: None  LDL 149, goal < 70  Start lipitor 80  Continue statin at discharge  Urinary  retention   s/p in and out x 3  S/p foley catheter now  On urecholine 10mg  tid  Other Stroke Risk Factors  F/Hx of Stroke maternal grandfather and maternal aunts and uncles.  UDS + for THC, cessation education provided  Other Active Problems  Depression   Additional Labs  Cardiolipin antibodies - < 9- normal  Prothrombin Gene Mutation-pending  Lupus Anticoagulant - 29.8- normal  Beta - 2 Glyco 1 IgG - < 9- normal  Homocysteine -7.4 - normal  DRVVT - 36.7 - normal  ANCA Proteinase 3 - < 3.5 - normal  ANA, IFA with reflex - negative  CRP - 0.5- normal  Sed rate - 10 - normal  RPR - non reactive - normal   Hospital day # 4  ATTENDING NOTE: I reviewed above note and agree with the assessment and plan. Pt was seen and examined.  Status post TPA  45 year old male with no known past medical history admitted for aphasia and right-sided weakness.  Status post TPA.  CT head concerning for subtle left MCA infarct.  CTA head neck showed no LVO.  MRI showed left MCA scattered moderate large infarct.  2D echo EF 60-65%, LE venous Doppler no DVT.  LDL 149, A1c 5.1.  UDS positive for THC. Hypercoagulable work up pending  The wife is at the bedside.  Overall things about the same.  There is mild aphasia and dysarthria.  Right upper extremity 4/5 but right hand 3/5.  Right lower extremity 4+.   Mild lower facial weakness/flattening of the nasolabial fold. Right field cut deficit seems getting smaller - mild.  Pending TEE 03/21/20. On DAPT and statin.  PT/OT recommend CIR. I have personally obtained history,examined this patient, reviewed notes, independently viewed imaging studies, participated in medical decision making and plan of care.ROS completed by me personally and pertinent positives fully documented  I have made any additions or clarifications directly to the above note. Agree with note above.  Patient continues to have expressive aphasia and right hemiparesis but  is showing slow improvement.  TEE confirms a small PFO.  Continue aspirin and Plavix for 3 weeks then aspirin alone and aggressive risk factor modification.  Patient counseled to quit marijuana.  May need consideration for elective endovascular PFO closure for discharge.  Transfer to inpatient rehab when bed available.  Greater than 50% time during this 25-minute visit was spent in counseling and coordination of care and discussion with care team.  Antony Contras, MD Medical Director [place] Pager: [phone] 03/21/2020 4:13 PM     To contact  Stroke Continuity provider, please refer to [email or web address] After hours, contact General Neurology

## 2020-03-21 NOTE — Transfer of Care (Signed)
Immediate Anesthesia Transfer of Care Note  Patient: Cody Henson  Procedure(s) Performed: TRANSESOPHAGEAL ECHOCARDIOGRAM (TEE) (N/A ) BUBBLE STUDY  Patient Location: Endoscopy Unit  Anesthesia Type:MAC  Level of Consciousness: awake, alert  and oriented  Airway & Oxygen Therapy: Patient Spontanous Breathing and Patient connected to nasal cannula oxygen  Post-op Assessment: Report given to RN and Post -op Vital signs reviewed and stable  Post vital signs: Reviewed and stable  Last Vitals:  Vitals Value Taken Time  BP 131/80 03/21/20 0839  Temp    Pulse 100 03/21/20 0839  Resp 18 03/21/20 0839  SpO2 98 % 03/21/20 0839  Vitals shown include unvalidated device data.  Last Pain:  Vitals:   03/21/20 0731  TempSrc: Oral  PainSc: 0-No pain         Complications: No complications documented.

## 2020-03-21 NOTE — Progress Notes (Signed)
Echocardiogram Echocardiogram Transesophageal has been performed.  Cody Henson [place] 03/21/2020, 8:37 AM

## 2020-03-21 NOTE — Anesthesia Procedure Notes (Signed)
Procedure Name: MAC Date/Time: 03/21/2020 7:54 AM Performed by: Candis Shine, CRNA Pre-anesthesia Checklist: Patient identified, Emergency Drugs available, Suction available, Patient being monitored and Timeout performed Patient Re-evaluated:Patient Re-evaluated prior to induction Oxygen Delivery Method: Nasal cannula Dental Injury: Teeth and Oropharynx as per pre-operative assessment

## 2020-03-22 ENCOUNTER — Encounter (HOSPITAL_COMMUNITY): Payer: Self-pay | Admitting: Internal Medicine

## 2020-03-22 LAB — BASIC METABOLIC PANEL
Anion gap: 11 (ref 5–15)
BUN: 12 mg/dL (ref 6–20)
CO2: 24 mmol/L (ref 22–32)
Calcium: 9 mg/dL (ref 8.9–10.3)
Chloride: 103 mmol/L (ref 98–111)
Creatinine, Ser: 1.02 mg/dL (ref 0.61–1.24)
GFR, Estimated: >60 mL/min (ref >60)
Glucose, Bld: 92 mg/dL (ref 70–99)
Potassium: 3.9 mmol/L (ref 3.5–5.1)
Sodium: 138 mmol/L (ref 135–145)

## 2020-03-22 LAB — CBC
HCT: 41.8 % (ref 39.0–52.0)
Hemoglobin: 14 g/dL (ref 13.0–17.0)
MCH: 29.2 pg (ref 26.0–34.0)
MCHC: 33.5 g/dL (ref 30.0–36.0)
MCV: 87.1 fL (ref 80.0–100.0)
Platelets: 284 10*3/uL (ref 150–400)
RBC: 4.8 MIL/uL (ref 4.22–5.81)
RDW: 12.2 % (ref 11.5–15.5)
WBC: 9.1 10*3/uL (ref 4.0–10.5)
nRBC: 0 % (ref 0.0–0.2)

## 2020-03-22 NOTE — Progress Notes (Signed)
Physical Therapy Treatment Patient Details Name: Cody Henson MRN: [id number] DOB: 10-11-1975 Today's Date: 03/22/2020    History of Present Illness Cody Henson is an 45 y.o. male presenting with acute onset of dense expressive and receptive aphasia with right sided weakness. MRI . Patchy multifocal acute ischemic infarcts involving the left cerebral hemisphere.  Associated mild scattered petechial hemorrhage without frank hemorrhagic transformation. No significant PMH.    PT Comments    Pt received in bed working on exercises given to him by speech therapist. Pt continues to be very motivated to progress. Worked on [employer] on elbow and hand in sitting EOB before mobility. Continues to be limited by strong flexion synergy RUE and RLE. Pt stood from bed to quad cane with mod A for transition of hand from bed to cane. Pt began ambulation with min A but as RLE fatigued he began to have R knee buckling and hyperextension and required mod A last 50' bout. Pt has significant need for PT/OT/SLP with very good potential for increased independence. Continue to strongly recommend CIR. PT will continue to follow.   Follow Up Recommendations  CIR     Equipment Recommendations  Wheelchair (measurements PT);Wheelchair cushion (measurements PT) (quad cane)    Recommendations for Other Services Rehab consult     Precautions / Restrictions Precautions Precautions: Fall Restrictions Weight Bearing Restrictions: No    Mobility  Bed Mobility Overal bed mobility: Needs Assistance Bed Mobility: Supine to Sit     Supine to sit: Supervision     General bed mobility comments: assist needed to remove covers from RLE as it was hung up in them. Increased time to come to sit EOB  Transfers Overall transfer level: Needs assistance Equipment used: Quad cane Transfers: Sit to/from Stand Sit to Stand: Mod assist         General transfer comment: mod A for R sided support as pt pushed up from bed with  LUE and transitioned hand bed to quad cane. Pt also needed min A placing RLE in safe position for [place]  Ambulation/Gait Ambulation/Gait assistance: Mod assist;Min assist Gait Distance (Feet): 50 Feet (3x with standing rest breaks) Assistive device: Quad cane Gait Pattern/deviations: Step-to pattern;Decreased step length - right;Ataxic Gait velocity: reduced Gait velocity interpretation: <1.31 ft/sec, indicative of household ambulator General Gait Details: pt began ambulation with min A for balance. After 1st 2 bouts of 50' pt's R knee began buckling and hyperextending, needing mod A to prevent R sided fall.   Stairs             Wheelchair Mobility    Modified Rankin (Stroke Patients Only) Modified Rankin (Stroke Patients Only) Pre-Morbid Rankin Score: No symptoms Modified Rankin: Moderately severe disability     Balance Overall balance assessment: Needs assistance Sitting-balance support: No upper extremity supported;Feet unsupported Sitting balance-Leahy Scale: Good Sitting balance - Comments: able to wt shift in sitting without LOB   Standing balance support: Single extremity supported Standing balance-Leahy Scale: Poor Standing balance comment: dependent on AD as well as external support                            Cognition Arousal/Alertness: Awake/alert Behavior During Therapy: WFL for tasks assessed/performed Overall Cognitive Status: Impaired/Different from baseline Area of Impairment: Attention;Memory;Following commands;Safety/judgement;Awareness;Problem solving                   Current Attention Level: Selective Memory: Decreased short-term memory Following Commands: Follows  one step commands consistently;Follows one step commands with increased time;Follows multi-step commands with increased time;Follows multi-step commands inconsistently Safety/Judgement: Decreased awareness of safety;Decreased awareness of deficits Awareness:  Emergent Problem Solving: Slow processing;Requires verbal cues;Requires tactile cues;Difficulty sequencing General Comments: pt needs redirection to R sided deficits at times. Has difficulty processing more than one instruction at a time or instructions while mobilizing      Exercises Other Exercises Other Exercises: [place] through R hand and elbow to decrease flexion synergy before ambulation.    General Comments General comments (skin integrity, edema, etc.): VSS on RA. Pt in recliner end of session with lunch tray      Pertinent Vitals/Pain Pain Assessment: No/denies pain    Home Living                      Prior Function            PT Goals (current goals can now be found in the care plan section) Acute Rehab PT Goals Patient Stated Goal: be independent again PT Goal Formulation: With patient Time For Goal Achievement: 03/31/20 Potential to Achieve Goals: Good Progress towards PT goals: Progressing toward goals    Frequency    Min 4X/week      PT Plan Current plan remains appropriate    Co-evaluation              AM-PAC PT "6 Clicks" Mobility   Outcome Measure  Help needed turning from your back to your side while in a flat bed without using bedrails?: A Little Help needed moving from lying on your back to sitting on the side of a flat bed without using bedrails?: A Little Help needed moving to and from a bed to a chair (including a wheelchair)?: A Lot Help needed standing up from a chair using your arms (e.g., wheelchair or bedside chair)?: A Little Help needed to walk in hospital room?: A Lot Help needed climbing 3-5 steps with a railing? : Total 6 Click Score: 14    End of Session Equipment Utilized During Treatment: Gait belt Activity Tolerance: Patient tolerated treatment well Patient left: in chair;with call bell/phone within reach Nurse Communication: Mobility status PT Visit Diagnosis: Apraxia (R48.2);Other symptoms and signs  involving the nervous system (R29.898);Hemiplegia and hemiparesis;Unsteadiness on feet (R26.81);Other abnormalities of gait and mobility (R26.89);Muscle weakness (generalized) (M62.81);Difficulty in walking, not elsewhere classified (R26.2) Hemiplegia - Right/Left: Right Hemiplegia - dominant/non-dominant: Dominant Hemiplegia - caused by: Cerebral infarction     Time: 0277-4128 PT Time Calculation (min) (ACUTE ONLY): 33 min  Charges:  $Gait Training: 23-37 mins                     [place]  Pager [phone] Office [place] 03/22/2020, 1:46 PM

## 2020-03-22 NOTE — Plan of Care (Signed)

## 2020-03-22 NOTE — Progress Notes (Signed)
Inpatient Rehab Admissions Coordinator:    I met with Pt. And wife to discuss pt.'s insurance status for CIR. His BCBS may not start for 3-5 days, but Pt. And wife would like to pursue CIR as self-pay. I provided an estimate for daily cost of a room on CIR.   Clemens Catholic, [place], [place] Admissions Coordinator  [phone] ([place]) [phone] (office)

## 2020-03-22 NOTE — Progress Notes (Signed)
STROKE TEAM PROGRESS NOTE   INTERVAL HISTORY No acute events in past 24 hours He is sitting up comfortably in bed. Patient has remained stable. He continues to show improvement with fluency of speech and naming.Vital signs are stable.  Neuro exam unchanged Vitals:   03/21/20 2327 03/22/20 0403 03/22/20 0806 03/22/20 0852  BP: 128/86 118/90 (!) 134/98 139/85  Pulse: 62 63 69 72  Resp: 17 17 17 17   Temp: 98.1 F (36.7 C) 97.8 F (36.6 C) 97.8 F (36.6 C) 98.1 F (36.7 C)  TempSrc: Oral Oral Oral Oral  SpO2: 98% 99% 97% 95%  Weight:      Height:       CBC:  Recent Labs  Lab 03/16/20 2220 03/16/20 2233 03/21/20 0207 03/22/20 0310  WBC 15.1*   < > 12.3* 9.1  NEUTROABS 12.4*  --   --   --   HGB 15.6   < > 14.9 14.0  HCT 44.6   < > 44.5 41.8  MCV 87.8   < > 87.3 87.1  PLT 295   < > 271 284   < > = values in this interval not displayed.   Basic Metabolic Panel:  Recent Labs  Lab 03/21/20 0207 03/22/20 0310  NA 137 138  K 3.8 3.9  CL 102 103  CO2 24 24  GLUCOSE 103* 92  BUN 10 12  CREATININE 1.07 1.02  CALCIUM 9.2 9.0   Lipid Panel:  Recent Labs  Lab 03/17/20 0344  CHOL 212*  TRIG 113  HDL 40*  CHOLHDL 5.3  VLDL 23  LDLCALC 149*   HgbA1c:  Recent Labs  Lab 03/17/20 0344  HGBA1C 5.1   Urine Drug Screen: Positive for THC  IMAGING past 24 hours  CT Head code stroke wo contrast 03/16/2020  IMPRESSION: 1. Subtle evolving hypodensity involving the supra ganglionic mid and posterior left frontal lobe, concerning for acute left MCA distribution infarct. No intracranial hemorrhage. 2. ASPECTS is 8.  CT Angio Head Neck w or wo contrast 03/16/2020  IMPRESSION: 1. Negative CTA for large vessel occlusion. No high-grade or correctable stenosis identified. 2. Mild irregularity of distal left MCA branches, which could be related to recent recanalization. 3. Small layering right pleural effusion.   MR Brain wo contrast 03/17/2020  Patchy multifocal  areas of restricted diffusion seen involving the cortical and subcortical aspect of the left frontal and parietal lobes as well as the left temporal occipital region, consistent with acute ischemic infarcts, left MCA distribution. Associated mild scattered petechial hemorrhage without frank hemorrhagic transformation. No significant regional mass effect.  VAS [place] Lower extremity 03/17/2020  Summary:  BILATERAL:  - No evidence of deep vein thrombosis seen in the lower extremities,  bilaterally.  -No evidence of popliteal cyst, bilaterally.   Echocardiogram 03/17/2020  1. Left ventricular ejection fraction, by estimation, is 60 to 65%. The  left ventricle has normal function. The left ventricle has no regional  wall motion abnormalities. Left ventricular diastolic parameters were  normal.   2. Right ventricular systolic function is normal. The right ventricular  size is normal. Tricuspid regurgitation signal is inadequate for assessing  PA pressure.   3. The mitral valve is normal in structure. No evidence of mitral valve  regurgitation. No evidence of mitral stenosis.   4. The aortic valve is tricuspid. Aortic valve regurgitation is not  visualized. No aortic stenosis is present.   5. Aortic dilatation noted. There is mild dilatation of the aortic root,  measuring  38 mm.   6. The inferior vena cava is normal in size with greater than 50%  respiratory variability, suggesting right atrial pressure of 3 mmHg.   PHYSICAL EXAM  General: Well-developed, well-nourished Caucasian male sitting comfortably in bed, NAD HEENT: [place]/AT, MMM, PERRLA Cardiovascular: Regular rate and rhythm Respiratory: No respiratory distress Abdomen: Soft, nondistended, no tenderness or rigidity Psych: Normal mood and affect Neurological:  Mental Status: Alert, awake, unable to assess orientation.  Expressive aphasia, not able to name, repeat, but makes meaningful sounds.  Able to follow midline commands  and peripheral commands.  Makes paraphasic errors.  Good comprehension Cranial Nerves: II:  V right hemianopia improved, pupils equal, round, reactive to light and accommodation III,IV, VI: ptosis not present, extra-ocular motions intact bilaterally V,VII: smile asymmetric, right nasolabial fold flattening facial light touch sensation normal bilaterally VIII: hearing normal bilaterally IX,X: uvula rises symmetrically XI: bilateral shoulder shrug XII: midline tongue extension without atrophy or fasciculations  Motor: Right : Upper extremity   4/5    left:     Upper extremity   5/5  Lower extremity   4/5     Lower extremity   5/5 Tone and bulk:normal tone throughout; no atrophy noted Sensory: Assess Deep Tendon Reflexes:  Right: Upper Extremity   Left: Upper extremity   biceps (C-5 to C-6) 2/4   biceps (C-5 to C-6) 2/4 tricep (C7) 2/4    triceps (C7) 2/4 Brachioradialis (C6) 2/4  Brachioradialis (C6) 2/4  Lower Extremity Lower Extremity  quadriceps (L-2 to L-4) 2/4   quadriceps (L-2 to L-4) 2/4 Achilles (S1) 2/4   Achilles (S1) 2/4  Plantars: Right: downgoing   Left: downgoing Cerebellar: Unable to access. Gait: Deferred  ASSESSMENT/PLAN [name] is a 45 y.o. male with unknown past medical history presented with acute onset of dense expressive and receptive aphasia with right-sided weakness. CT head with subtle hypodensity in left MCA territory. CTA of head and neck with no large vessel occlusion.  Patient received TPA at 10:40 PM on 03/16/2020.  MRA head shows patchy multifocal area of restricted diffusion seen involving the cortical and subcortical aspect of left frontal and parietal lobes as well as left temporal occipital region, consistent with acute ischemic infarct, left MCA distribution.  Stroke: Acute ischemic infarct in left MCA distribution s/p tPA likely embolic in origin, unclear source at this point.  Code Stroke CT head: Subtle evolving hypodensity involving the  supra ganglionic mid and posterior left frontal lobe, concerning for acute left MCA distribution infarct. ASPECTS 8.  CTA head & neck:  Negative CTA for large vessel occlusion. Mild irregularity of distal left MCA branches, which could be related to recent recanalization.  MRI: Patchy multifocal areas of restricted diffusion seen involving the cortical and subcortical aspect of the left frontal and parietal lobes as well as the left temporal occipital region, consistent with acute ischemic infarcts, left MCA distribution  Doppler US Lower extremities: No evidence of deep vein thrombosis  2D Echo:  EF 60 to 65%.  TEE - Positive PFO with bubbles crossing the atrial septum within 1-2 heart beats. Right to left shunt.  TCD bubble study: Positive with Grade 2 during rest and Grade 3 on valsalva  LDL 149  HgbA1c 5.1  UDS + THC  Hypercoagulable and autoimmune work up -> all are normal except  Prothrombin Gene Mutation which is currently pending  VTE prophylaxis -Lovenox 40 mg  No antithrombotic prior to admission, now on aspirin 81 mg and Plavix  75mg  DAPT for 3 weeks and then ASA alone  Therapy recommendations: CIR  Disposition: Pending  PFO  TCD bubble study: Positive with Grade 2 during rest and Grade 3 on valsalva   TEE : Positive PFO with bubbles crossing the atrial septum within 1-2 heart beats. Right to left shunt.  May consider PFO closure if no other stroke source found  Hypertension  Home meds: None  Stable . SBP< 180/105 after tPA  . Long-term BP goal normotensive  Hyperlipidemia  Home meds: None  LDL 149, goal < 70  Start lipitor 80  Continue statin at discharge  Urinary retention   s/p in and out x 3  S/p foley catheter now  On urecholine 10mg  tid  Other Stroke Risk Factors  F/Hx of Stroke maternal grandfather and maternal aunts and uncles.  UDS + for THC, cessation education provided  Other Active Problems  Depression   Additional  Labs  Cardiolipin antibodies - < 9- normal  Prothrombin Gene Mutation-pending  Lupus Anticoagulant - 29.8- normal  Beta - 2 Glyco 1 IgG - < 9- normal  Homocysteine -7.4 - normal  DRVVT - 36.7 - normal  ANCA Proteinase 3 - < 3.5 - normal  ANA, IFA with reflex - negative  CRP - 0.5- normal  Sed rate - 10 - normal  RPR - non reactive - normal  Hospital day # 5  ATTENDING NOTE:  Patient continues to have mild expressive aphasia right hand weakness which appears to be improving.  He has been recommended for inpatient rehab but he does not have any current insurance has insurance and outcome transferring to [facility] will take another 3 to 5 days.  Patient and wife thinking with a they need to do self pay for this.  Or go home. Epic message sent to Dr. Burt Knack to see patient as outpatient when consider endovascular PFO closure.  Discussed with patient and rehab coordinator.  Greater than 50% time during this 25-minute visit was spent in counseling and coordination of care about stroke and questions Antony Contras, MD   To contact Stroke Continuity provider, please refer to [email or web address] After hours, contact General Neurology

## 2020-03-22 NOTE — Progress Notes (Signed)
Inpatient Rehab Admissions Coordinator:   I met with pt. And spoke with Pt.'s wife regarding potential CIR admission. Per Pt.'s wife, his [employer] policy terminated 3/67/25. He is to be added to his wife's [employer] policy and have the policy backdated to 5/0/01; however, policy will not be active for 3-5 business days. Pt. Appears medically ready for d/c and is currently min A-mod I with PT/OT, so MD may want to consider d/c home if pt. Does not need to remain admitted until new policy goes into effect.   Clemens Catholic, [place], [place] Admissions Coordinator  [phone] ([place]) [phone] (office)

## 2020-03-23 ENCOUNTER — Inpatient Hospital Stay (HOSPITAL_COMMUNITY)
Admission: RE | Admit: 2020-03-23 | Discharge: 2020-04-06 | DRG: 057 | Disposition: A | Discharge status: Home / Self Care | Payer: BC Managed Care – PPO | Source: Intra-hospital | Attending: Physical Medicine & Rehabilitation | Admitting: Physical Medicine & Rehabilitation

## 2020-03-23 ENCOUNTER — Encounter (HOSPITAL_COMMUNITY): Payer: Self-pay | Admitting: Physical Medicine & Rehabilitation

## 2020-03-23 ENCOUNTER — Other Ambulatory Visit: Payer: Self-pay

## 2020-03-23 ENCOUNTER — Other Ambulatory Visit (HOSPITAL_COMMUNITY): Payer: Self-pay | Admitting: Adult Health

## 2020-03-23 DIAGNOSIS — F32A Depression, unspecified: Secondary | ICD-10-CM | POA: Diagnosis present

## 2020-03-23 DIAGNOSIS — I69322 Dysarthria following cerebral infarction: Secondary | ICD-10-CM

## 2020-03-23 DIAGNOSIS — I69393 Ataxia following cerebral infarction: Secondary | ICD-10-CM

## 2020-03-23 DIAGNOSIS — I10 Essential (primary) hypertension: Secondary | ICD-10-CM

## 2020-03-23 DIAGNOSIS — E785 Hyperlipidemia, unspecified: Secondary | ICD-10-CM | POA: Diagnosis not present

## 2020-03-23 DIAGNOSIS — Z79899 Other long term (current) drug therapy: Secondary | ICD-10-CM | POA: Diagnosis not present

## 2020-03-23 DIAGNOSIS — Z23 Encounter for immunization: Secondary | ICD-10-CM | POA: Diagnosis not present

## 2020-03-23 DIAGNOSIS — R030 Elevated blood-pressure reading, without diagnosis of hypertension: Secondary | ICD-10-CM | POA: Diagnosis not present

## 2020-03-23 DIAGNOSIS — I69390 Apraxia following cerebral infarction: Secondary | ICD-10-CM | POA: Diagnosis not present

## 2020-03-23 DIAGNOSIS — R339 Retention of urine, unspecified: Secondary | ICD-10-CM | POA: Diagnosis not present

## 2020-03-23 DIAGNOSIS — S5001XA Contusion of right elbow, initial encounter: Secondary | ICD-10-CM | POA: Diagnosis not present

## 2020-03-23 DIAGNOSIS — Z91012 Allergy to eggs: Secondary | ICD-10-CM | POA: Diagnosis not present

## 2020-03-23 DIAGNOSIS — I69351 Hemiplegia and hemiparesis following cerebral infarction affecting right dominant side: Secondary | ICD-10-CM | POA: Diagnosis not present

## 2020-03-23 DIAGNOSIS — R7401 Elevation of levels of liver transaminase levels: Secondary | ICD-10-CM | POA: Diagnosis not present

## 2020-03-23 DIAGNOSIS — Z7982 Long term (current) use of aspirin: Secondary | ICD-10-CM

## 2020-03-23 DIAGNOSIS — R040 Epistaxis: Secondary | ICD-10-CM | POA: Diagnosis not present

## 2020-03-23 DIAGNOSIS — G8191 Hemiplegia, unspecified affecting right dominant side: Secondary | ICD-10-CM | POA: Diagnosis not present

## 2020-03-23 DIAGNOSIS — R7989 Other specified abnormal findings of blood chemistry: Secondary | ICD-10-CM | POA: Diagnosis present

## 2020-03-23 DIAGNOSIS — Q211 Atrial septal defect: Secondary | ICD-10-CM | POA: Diagnosis not present

## 2020-03-23 DIAGNOSIS — R4701 Aphasia: Secondary | ICD-10-CM | POA: Diagnosis not present

## 2020-03-23 DIAGNOSIS — R233 Spontaneous ecchymoses: Secondary | ICD-10-CM | POA: Diagnosis present

## 2020-03-23 DIAGNOSIS — S301XXA Contusion of abdominal wall, initial encounter: Secondary | ICD-10-CM | POA: Diagnosis not present

## 2020-03-23 DIAGNOSIS — Y92239 Unspecified place in hospital as the place of occurrence of the external cause: Secondary | ICD-10-CM | POA: Diagnosis not present

## 2020-03-23 DIAGNOSIS — I69320 Aphasia following cerebral infarction: Secondary | ICD-10-CM

## 2020-03-23 DIAGNOSIS — I63512 Cerebral infarction due to unspecified occlusion or stenosis of left middle cerebral artery: Secondary | ICD-10-CM | POA: Diagnosis present

## 2020-03-23 DIAGNOSIS — Z823 Family history of stroke: Secondary | ICD-10-CM | POA: Diagnosis not present

## 2020-03-23 DIAGNOSIS — I69319 Unspecified symptoms and signs involving cognitive functions following cerebral infarction: Secondary | ICD-10-CM | POA: Diagnosis not present

## 2020-03-23 DIAGNOSIS — Z7902 Long term (current) use of antithrombotics/antiplatelets: Secondary | ICD-10-CM | POA: Diagnosis not present

## 2020-03-23 DIAGNOSIS — Z91014 Allergy to mammalian meats: Secondary | ICD-10-CM

## 2020-03-23 DIAGNOSIS — X58XXXA Exposure to other specified factors, initial encounter: Secondary | ICD-10-CM | POA: Diagnosis not present

## 2020-03-23 DIAGNOSIS — I63511 Cerebral infarction due to unspecified occlusion or stenosis of right middle cerebral artery: Principal | ICD-10-CM | POA: Diagnosis present

## 2020-03-23 DIAGNOSIS — T45515A Adverse effect of anticoagulants, initial encounter: Secondary | ICD-10-CM | POA: Diagnosis not present

## 2020-03-23 DIAGNOSIS — I69359 Hemiplegia and hemiparesis following cerebral infarction affecting unspecified side: Secondary | ICD-10-CM

## 2020-03-23 DIAGNOSIS — I639 Cerebral infarction, unspecified: Secondary | ICD-10-CM | POA: Diagnosis not present

## 2020-03-23 LAB — CREATININE, SERUM
Creatinine, Ser: 0.99 mg/dL (ref 0.61–1.24)
GFR, Estimated: >60 mL/min (ref >60)

## 2020-03-23 MED ORDER — PANTOPRAZOLE SODIUM 40 MG PO TBEC: 40.0000 mg | DELAYED_RELEASE_TABLET | Freq: Every day | ORAL | Status: DC

## 2020-03-23 MED ORDER — SENNOSIDES-DOCUSATE SODIUM 8.6-50 MG PO TABS: 1.0000 | ORAL_TABLET | Freq: Every evening | ORAL | Status: DC | PRN

## 2020-03-23 MED ORDER — ENOXAPARIN SODIUM 40 MG/0.4ML ~~LOC~~ SOLN
40.0000 mg | SUBCUTANEOUS | Status: DC
  Administered 2020-03-24 – 2020-04-03 (×11): 40 mg via SUBCUTANEOUS
  Filled 2020-03-23 (×12): qty 0.4

## 2020-03-23 MED ORDER — ACETAMINOPHEN 325 MG PO TABS: 650.0000 mg | ORAL_TABLET | Freq: Four times a day (QID) | ORAL | Status: DC | PRN

## 2020-03-23 MED ORDER — SERTRALINE HCL 50 MG PO TABS
50.0000 mg | ORAL_TABLET | Freq: Every day | ORAL | Status: DC
  Administered 2020-03-24 – 2020-04-06 (×14): 50 mg via ORAL
  Filled 2020-03-23 (×14): qty 1

## 2020-03-23 MED ORDER — ATORVASTATIN CALCIUM 80 MG PO TABS
80.0000 mg | ORAL_TABLET | Freq: Every day | ORAL | Status: DC
  Administered 2020-03-24 – 2020-04-06 (×14): 80 mg via ORAL
  Filled 2020-03-23 (×14): qty 1

## 2020-03-23 MED ORDER — INFLUENZA VAC SPLIT QUAD 0.5 ML IM SUSY
0.5000 mL | PREFILLED_SYRINGE | INTRAMUSCULAR | Status: DC
  Filled 2020-03-23: qty 0.5

## 2020-03-23 MED ORDER — ASPIRIN EC 81 MG PO TBEC: 81.0000 mg | DELAYED_RELEASE_TABLET | Freq: Every day | ORAL | 2 refills | Status: AC

## 2020-03-23 MED ORDER — LIDOCAINE HCL URETHRAL/MUCOSAL 2 % EX GEL: CUTANEOUS | Status: DC | PRN

## 2020-03-23 MED ORDER — MELATONIN 3 MG PO TABS: 3.0000 mg | ORAL_TABLET | Freq: Every evening | ORAL | Status: DC | PRN

## 2020-03-23 MED ORDER — ENOXAPARIN SODIUM 40 MG/0.4ML ~~LOC~~ SOLN: 40.0000 mg | SUBCUTANEOUS | Status: DC

## 2020-03-23 MED ORDER — ATORVASTATIN CALCIUM 80 MG PO TABS: 80.0000 mg | ORAL_TABLET | Freq: Every day | ORAL | 1 refills | Status: DC

## 2020-03-23 MED ORDER — CLOPIDOGREL BISULFATE 75 MG PO TABS: 75.0000 mg | ORAL_TABLET | Freq: Every day | ORAL | 0 refills | Status: DC

## 2020-03-23 MED ORDER — ALUM & MAG HYDROXIDE-SIMETH 200-200-20 MG/5ML PO SUSP: 30.0000 mL | ORAL | Status: DC | PRN

## 2020-03-23 MED ORDER — BETHANECHOL CHLORIDE 10 MG PO TABS
10.0000 mg | ORAL_TABLET | Freq: Three times a day (TID) | ORAL | Status: DC
  Administered 2020-03-23 – 2020-04-05 (×38): 10 mg via ORAL
  Filled 2020-03-23 (×38): qty 1

## 2020-03-23 MED ORDER — CLOPIDOGREL BISULFATE 75 MG PO TABS
75.0000 mg | ORAL_TABLET | Freq: Every day | ORAL | Status: DC
  Administered 2020-03-24 – 2020-04-06 (×14): 75 mg via ORAL
  Filled 2020-03-23 (×14): qty 1

## 2020-03-23 MED ORDER — PANTOPRAZOLE SODIUM 40 MG PO TBEC
40.0000 mg | DELAYED_RELEASE_TABLET | Freq: Every day | ORAL | Status: DC
  Administered 2020-03-24 – 2020-04-06 (×14): 40 mg via ORAL
  Filled 2020-03-23 (×15): qty 1

## 2020-03-23 MED ORDER — ASPIRIN 81 MG PO CHEW
81.0000 mg | CHEWABLE_TABLET | Freq: Every day | ORAL | Status: DC
  Administered 2020-03-24 – 2020-04-06 (×14): 81 mg via ORAL
  Filled 2020-03-23 (×14): qty 1

## 2020-03-23 NOTE — Discharge Summary (Addendum)
Stroke Discharge Summary  Patient ID: Cody Henson   MRN: [id number]      DOB: [birth year]  Date of Admission: 03/16/2020 Date of Discharge: 03/23/2020  Attending Physician:  Garvin Fila, MD, Stroke MD Consultant(s):   PM&R team, Dr. Posey Pronto  Patient's PCP:  Patient, No Pcp Per  Discharge Diagnoses:  1. Cryptogenic Left MCA stroke s/p iv TPA administration 2. Aphasia 3. Right hemibody weakness  4. Impaired balance 5. Impaired coordination 6. PFO  Active Problems:   Stroke (cerebrum) (HCC)   Acute ischemic stroke (HCC)   Global aphasia   Depression   Hyperglycemia   Right hemiparesis (HCC)   Aphasia   Elevated blood pressure reading  Medications to be continued on Rehab Allergies as of 03/23/2020       Reactions   Albumen, Egg Itching   Other Hives   Chicken meat, [place]        Medication List     TAKE these medications    acetaminophen 325 MG tablet Commonly known as: TYLENOL Take 650 mg by mouth every 6 (six) hours as needed for mild pain or headache.   amphetamine-dextroamphetamine 10 MG tablet Commonly known as: ADDERALL Take 10 mg by mouth 3 (three) times daily.   aspirin EC 81 MG tablet Take 1 tablet (81 mg total) by mouth daily. Swallow whole.   atorvastatin 80 MG tablet Commonly known as: LIPITOR Take 1 tablet (80 mg total) by mouth daily.   clopidogrel 75 MG tablet Commonly known as: PLAVIX Take 1 tablet (75 mg total) by mouth daily for 21 days.   enoxaparin 40 MG/0.4ML injection Commonly known as: LOVENOX Inject 0.4 mLs (40 mg total) into the skin daily. Start taking on: March 24, 2020   multivitamin tablet Take 1 tablet by mouth daily.   pantoprazole 40 MG tablet Commonly known as: PROTONIX Take 1 tablet (40 mg total) by mouth daily. Start taking on: March 24, 2020   senna-docusate 8.6-50 MG tablet Commonly known as: Senokot-S Take 1 tablet by mouth at bedtime as needed for mild constipation.   sertraline 50 MG  tablet Commonly known as: ZOLOFT Take 50 mg by mouth daily.        LABORATORY STUDIES  Cardiolipin antibodies - < 9 - normal Prothrombin Gene Mutation - pending  Lupus Anticoagulant - 29.8 - normal Beta - 2 Glyco 1 IgG - < 9 - normal  Homocysteine - 7.4 - normal DRVVT - 36.7 - normal ANCA Proteinase 3 - < 3.5 - normal ANA, IFA with reflex - negative CRP - 0.5- normal Sed rate - 10 - normal RPR - non reactive - normal   CBC    Component Value Date/Time   WBC 9.1 03/22/2020 0310   RBC 4.80 03/22/2020 0310   HGB 14.0 03/22/2020 0310   HCT 41.8 03/22/2020 0310   PLT 284 03/22/2020 0310   MCV 87.1 03/22/2020 0310   MCH 29.2 03/22/2020 0310   MCHC 33.5 03/22/2020 0310   RDW 12.2 03/22/2020 0310   LYMPHSABS 1.7 03/16/2020 2220   MONOABS 0.6 03/16/2020 2220   EOSABS 0.1 03/16/2020 2220   BASOSABS 0.1 03/16/2020 2220   CMP    Component Value Date/Time   NA 138 03/22/2020 0310   K 3.9 03/22/2020 0310   CL 103 03/22/2020 0310   CO2 24 03/22/2020 0310   GLUCOSE 92 03/22/2020 0310   BUN 12 03/22/2020 0310   CREATININE 1.02 03/22/2020 0310   CALCIUM 9.0 03/22/2020  0310   PROT 7.2 03/16/2020 2220   ALBUMIN 4.6 03/16/2020 2220   AST 33 03/16/2020 2220   ALT 34 03/16/2020 2220   ALKPHOS 72 03/16/2020 2220   BILITOT 0.6 03/16/2020 2220   GFRNONAA >60 03/22/2020 0310   COAGS Lab Results  Component Value Date   INR 1.0 03/16/2020   Lipid Panel    Component Value Date/Time   CHOL 212 (H) 03/17/2020 0344   TRIG 113 03/17/2020 0344   HDL 40 (L) 03/17/2020 0344   CHOLHDL 5.3 03/17/2020 0344   VLDL 23 03/17/2020 0344   LDLCALC 149 (H) 03/17/2020 0344   HgbA1C  Lab Results  Component Value Date   HGBA1C 5.1 03/17/2020   Urinalysis No results found for: COLORURINE, APPEARANCEUR, LABSPEC, PHURINE, GLUCOSEU, HGBUR, BILIRUBINUR, KETONESUR, PROTEINUR, UROBILINOGEN, NITRITE, LEUKOCYTESUR Urine Drug Screen     Component Value Date/Time   LABOPIA NONE DETECTED  03/17/2020 0846   COCAINSCRNUR NONE DETECTED 03/17/2020 0846   LABBENZ NONE DETECTED 03/17/2020 0846   AMPHETMU NONE DETECTED 03/17/2020 0846   THCU POSITIVE (A) 03/17/2020 0846   LABBARB NONE DETECTED 03/17/2020 0846    Alcohol Level No results found for: ETH   SIGNIFICANT DIAGNOSTIC STUDIES CT HEAD WO CONTRAST  Result Date: 03/17/2020 CLINICAL DATA:  Stroke follow-up EXAM: CT HEAD WITHOUT CONTRAST TECHNIQUE: Contiguous axial images were obtained from the base of the skull through the vertex without intravenous contrast. COMPARISON:  Head CT 03/16/2020 FINDINGS: Brain: Progression of cytotoxic edema in the posterior left MCA territory infarct affecting the parietal and temporal lobes. No acute hemorrhage. Cytotoxic edema has also increased in the more anterior left MCA infarct location. No midline shift or other mass effect. Vascular: No hyperdense vessel or unexpected calcification. Skull: Normal. Negative for fracture or focal lesion. Sinuses/Orbits: No acute finding. Other: None. IMPRESSION: Expected progression of cytotoxic edema within multifocal left MCA infarct sites. No hemorrhage. Electronically Signed   By: Ulyses Jarred M.D.   On: 03/17/2020 22:06   CT ANGIO NECK W OR WO CONTRAST  Result Date: 03/16/2020 CLINICAL DATA:  Initial evaluation for acute stroke, receptive and expressive aphasia. EXAM: CT ANGIOGRAPHY HEAD AND NECK TECHNIQUE: Multidetector CT imaging of the head and neck was performed using the standard protocol during bolus administration of intravenous contrast. Multiplanar CT image reconstructions and MIPs were obtained to evaluate the vascular anatomy. Carotid stenosis measurements (when applicable) are obtained utilizing NASCET criteria, using the distal internal carotid diameter as the denominator. CONTRAST:  156mL OMNIPAQUE IOHEXOL 350 MG/ML SOLN COMPARISON:  Prior head CT from earlier the same day. FINDINGS: CTA NECK FINDINGS Aortic arch: Visualized aortic arch of  normal caliber with normal branch pattern. No hemodynamically significant stenosis seen about the origin of the great vessels. Right carotid system: Right common and internal carotid arteries patent without stenosis, dissection or occlusion. Left carotid system: Left common and internal carotid arteries patent without stenosis, dissection or occlusion. Vertebral arteries: Both vertebral arteries arise from the subclavian arteries. Vertebral arteries widely patent without stenosis, dissection or occlusion. Skeleton: No visible acute osseous abnormality. No discrete or worrisome osseous lesions. Other neck: No other acute soft tissue abnormality within the neck. No mass or adenopathy. Upper chest: Small layering right pleural effusion noted. Partially visualized lungs and upper chest demonstrate no other acute finding. Review of the MIP images confirms the above findings CTA HEAD FINDINGS Anterior circulation: Both internal carotid arteries patent to the termini without stenosis. A1 segments patent bilaterally. Normal anterior communicating artery complex. Anterior  cerebral arteries patent to their distal aspects without stenosis. No M1 stenosis or occlusion. Normal MCA bifurcations. No proximal MCA branch occlusion. Mild irregularity of distal left MCA branches noted, which could be related to recent recanalization. No visible proximal branch occlusion. Posterior circulation: Both V4 segments patent to the vertebrobasilar junction without stenosis. Both PICA origins patent and normal. Basilar patent to its distal aspect without stenosis. Superior cerebellar arteries patent bilaterally. Right PCA supplied via the basilar. Predominant fetal type origin of the left PCA. Both PCAs well perfused to their distal aspects without stenosis. Venous sinuses: Patent. Anatomic variants: Fetal type origin left PCA.  No aneurysm. Review of the MIP images confirms the above findings IMPRESSION: 1. Negative CTA for large vessel  occlusion. No high-grade or correctable stenosis identified. 2. Mild irregularity of distal left MCA branches, which could be related to recent recanalization. 3. Small layering right pleural effusion. Critical Value/emergent results were called by telephone at the time of interpretation on 03/16/2020 at 10:55 pm to provider [facility] , who verbally acknowledged these results. Electronically Signed   By: Jeannine Boga M.D.   On: 03/16/2020 23:23   MR BRAIN WO CONTRAST  Result Date: 03/17/2020 CLINICAL DATA:  Follow-up examination for acute stroke. EXAM: MRI HEAD WITHOUT CONTRAST TECHNIQUE: Multiplanar, multiecho pulse sequences of the brain and surrounding structures were obtained without intravenous contrast. COMPARISON:  Prior CTs from 03/16/2020. FINDINGS: Brain: Cerebral volume within normal limits. No significant cerebral white matter disease. Patchy multifocal areas of restricted diffusion seen involving the cortical and subcortical aspect of the left frontal and parietal lobes as well as the left temporal occipital region, consistent with acute ischemic infarcts, left MCA distribution. Associated mild scattered petechial hemorrhage without frank hemorrhagic transformation. No significant regional mass effect. No other evidence for acute or subacute ischemia. Gray-white matter differentiation otherwise maintained. No encephalomalacia to suggest chronic cortical infarction elsewhere within the brain. No other evidence for acute or chronic intracranial hemorrhage. 2 cm benign arachnoid cyst overlies the right frontal convexity. No other mass lesion, mass effect or midline shift. No hydrocephalus or extra-axial fluid collection. Pituitary gland suprasellar region within normal limits. Midline structures intact. Vascular: Major intracranial vascular flow voids are maintained. Skull and upper cervical spine: Craniocervical junction within normal limits. Bone marrow signal intensity normal. No scalp  soft tissue abnormality. Sinuses/Orbits: Globes and orbital soft tissues within normal limits. Paranasal sinuses are clear. No significant mastoid effusion. Inner ear structures grossly normal. Other: None. IMPRESSION: 1. Patchy multifocal acute ischemic infarcts involving the left cerebral hemisphere as above. Associated mild scattered petechial hemorrhage without frank hemorrhagic transformation. No significant regional mass effect. 2. Otherwise normal brain MRI for age. Electronically Signed   By: Jeannine Boga M.D.   On: 03/17/2020 03:24   VAS [place] TRANSCRANIAL DOPPLER W BUBBLES  Result Date: 03/19/2020  Transcranial Doppler with Bubble Indications: Stroke. Comparison Study: No prior studies. Performing Technologist: Darlin Coco RDMS  Examination Guidelines: A complete evaluation includes B-mode imaging, spectral Doppler, color Doppler, and power Doppler as needed of all accessible portions of each vessel. Bilateral testing is considered an integral part of a complete examination. Limited examinations for reoccurring indications may be performed as noted.  Summary:  A vascular evaluation was performed. The right middle cerebral artery was studied. An IV was inserted into the patient's left forearm. Verbal informed consent was obtained.  Between 10 to 15 HITS (High Intensity Transient Signals) were heard at rest, indicating a Spencer Grade 2 PFO (Patent Foramen  Ovale) at rest. Between 15 to 50 HITS were heard during valsalva, indicating a Spencer Grade 3 PFO with valsalva maneuver. *See table(s) above for TCD measurements and observations.  Diagnosing physician: Antony Contras MD Electronically signed by Antony Contras MD on 03/19/2020 at 11:14:35 AM.    Final    ECHOCARDIOGRAM COMPLETE  Result Date: 03/17/2020    ECHOCARDIOGRAM REPORT   Patient Name:   Cody Henson Date of Exam: 03/17/2020 Medical Rec #:  [id number]  Height: Accession #:    [id number] Weight:       165.6 lb Date of Birth:  04/13/1975  BSA:           1.780 m Patient Age:    37 years   BP:           121/96 mmHg Patient Gender: M          HR:           63 bpm. Exam Location:  Inpatient Procedure: 2D Echo, Cardiac Doppler and Color Doppler Indications:    Stroke  History:        Patient has no prior history of Echocardiogram examinations.                 Stroke. No cardiac history.  Sonographer:    Roseanna Rainbow RDCS Referring Phys: [place]  1. Left ventricular ejection fraction, by estimation, is 60 to 65%. The left ventricle has normal function. The left ventricle has no regional wall motion abnormalities. Left ventricular diastolic parameters were normal.  2. Right ventricular systolic function is normal. The right ventricular size is normal. Tricuspid regurgitation signal is inadequate for assessing PA pressure.  3. The mitral valve is normal in structure. No evidence of mitral valve regurgitation. No evidence of mitral stenosis.  4. The aortic valve is tricuspid. Aortic valve regurgitation is not visualized. No aortic stenosis is present.  5. Aortic dilatation noted. There is mild dilatation of the aortic root, measuring 38 mm.  6. The inferior vena cava is normal in size with greater than 50% respiratory variability, suggesting right atrial pressure of 3 mmHg. FINDINGS  Left Ventricle: Left ventricular ejection fraction, by estimation, is 60 to 65%. The left ventricle has normal function. The left ventricle has no regional wall motion abnormalities. The left ventricular internal cavity size was normal in size. There is  no left ventricular hypertrophy. Left ventricular diastolic parameters were normal. Right Ventricle: The right ventricular size is normal. No increase in right ventricular wall thickness. Right ventricular systolic function is normal. Tricuspid regurgitation signal is inadequate for assessing PA pressure. Left Atrium: Left atrial size was normal in size. Right Atrium: Right atrial size was normal in size. Pericardium:  There is no evidence of pericardial effusion. Mitral Valve: The mitral valve is normal in structure. No evidence of mitral valve regurgitation. No evidence of mitral valve stenosis. Tricuspid Valve: The tricuspid valve is normal in structure. Tricuspid valve regurgitation is not demonstrated. Aortic Valve: The aortic valve is tricuspid. Aortic valve regurgitation is not visualized. No aortic stenosis is present. Pulmonic Valve: The pulmonic valve was normal in structure. Pulmonic valve regurgitation is not visualized. Aorta: Aortic dilatation noted. There is mild dilatation of the aortic root, measuring 38 mm. Venous: The inferior vena cava is normal in size with greater than 50% respiratory variability, suggesting right atrial pressure of 3 mmHg. IAS/Shunts: No atrial level shunt detected by color flow Doppler.  LEFT VENTRICLE PLAX 2D LVIDd:  3.70 cm     Diastology LVIDs:         2.00 cm     LV e' medial:    8.70 cm/s LV PW:         1.50 cm     LV E/e' medial:  8.0 LV IVS:        1.30 cm     LV e' lateral:   10.10 cm/s LVOT diam:     2.00 cm     LV E/e' lateral: 6.9 LV SV:         62 LV SV Index:   35 LVOT Area:     3.14 cm  LV Volumes (MOD) LV vol d, MOD A2C: 82.2 ml LV vol d, MOD A4C: 49.0 ml LV vol s, MOD A2C: 27.5 ml LV vol s, MOD A4C: 19.1 ml LV SV MOD A2C:     54.7 ml LV SV MOD A4C:     49.0 ml LV SV MOD BP:      40.2 ml RIGHT VENTRICLE             IVC RV S prime:     13.70 cm/s  IVC diam: 1.90 cm TAPSE (M-mode): 1.7 cm LEFT ATRIUM             Index       RIGHT ATRIUM           Index LA diam:        3.10 cm 1.74 cm/m  RA Area:     10.90 cm LA Vol (A2C):   16.9 ml 9.49 ml/m  RA Volume:   23.10 ml  12.98 ml/m LA Vol (A4C):   18.2 ml 10.22 ml/m LA Biplane Vol: 17.7 ml 9.94 ml/m  AORTIC VALVE LVOT Vmax:   94.20 cm/s LVOT Vmean:  60.300 cm/s LVOT VTI:    0.197 m  AORTA Ao Root diam: 3.80 cm Ao Asc diam:  3.50 cm MITRAL VALVE MV Area (PHT): 4.89 cm    SHUNTS MV Decel Time: 155 msec    Systemic VTI:   0.20 m MV E velocity: 69.80 cm/s  Systemic Diam: 2.00 cm MV A velocity: 51.70 cm/s MV E/A ratio:  1.35 Loralie Champagne MD Electronically signed by Loralie Champagne MD Signature Date/Time: 03/17/2020/10:42:12 PM    Final    ECHO TEE  Result Date: 03/21/2020    TRANSESOPHOGEAL ECHO REPORT   Patient Name:   Cody Henson Date of Exam: 03/21/2020 Medical Rec #:  [id number]  Height:       68.0 in Accession #:    [id number] Weight:       165.6 lb Date of Birth:  [birth year]  BSA:          1.886 m Patient Age:    44 years   BP:           171/106 mmHg Patient Gender: M          HR:           87 bpm. Exam Location:  Inpatient Procedure: Transesophageal Echo, Color Doppler, Cardiac Doppler and Saline            Contrast Bubble Study Indications:     Stroke i163.9  History:         Patient has prior history of Echocardiogram examinations, most                  recent 03/17/2020.  Sonographer:     [place] Referring  Phys:  [id number] Leanor Kail Diagnosing Phys: Cherlynn Kaiser MD PROCEDURE: After discussion of the risks and benefits of a TEE, an informed consent was obtained from the patient. TEE procedure time was 18 minutes. The transesophogeal probe was passed without difficulty through the esophogus of the patient. Imaged were obtained with the patient in a left lateral decubitus position. Local oropharyngeal anesthetic was provided with Cetacaine. Sedation performed by different physician. The patient was monitored while under deep sedation. Anesthestetic sedation was provided intravenously by Anesthesiology: 440mg  of Propofol. Image quality was adequate. The patient's vital signs; including heart rate, blood pressure, and oxygen saturation; remained stable throughout the procedure. The patient developed no complications during the procedure. IMPRESSIONS  1. Evidence of atrial level shunting detected by color flow Doppler. Agitated saline contrast bubble study was positive with shunting observed within 3-6 cardiac  cycles suggestive of interatrial shunt.  2. Left ventricular ejection fraction, by estimation, is 65 to 70%. The left ventricle has normal function. The left ventricle has no regional wall motion abnormalities.  3. Right ventricular systolic function is normal. The right ventricular size is normal.  4. No left atrial/left atrial appendage thrombus was detected.  5. The mitral valve is normal in structure. Trivial mitral valve regurgitation. No evidence of mitral stenosis.  6. The aortic valve is normal in structure. Aortic valve regurgitation is not visualized. No aortic stenosis is present.  7. Aortic dilatation noted. There is borderline dilatation of the aortic root, measuring 38 mm. Conclusion(s)/Recommendation(s): Normal biventricular function without evidence of hemodynamically significant valvular heart disease. PFO present with right to left shunt. FINDINGS  Left Ventricle: Left ventricular ejection fraction, by estimation, is 65 to 70%. The left ventricle has normal function. The left ventricle has no regional wall motion abnormalities. The left ventricular internal cavity size was normal in size. There is  no left ventricular hypertrophy. Right Ventricle: The right ventricular size is normal. No increase in right ventricular wall thickness. Right ventricular systolic function is normal. Left Atrium: Left atrial size was normal in size. No left atrial/left atrial appendage thrombus was detected. Right Atrium: Right atrial size was not assessed. Pericardium: There is no evidence of pericardial effusion. Mitral Valve: The mitral valve is normal in structure. Trivial mitral valve regurgitation. No evidence of mitral valve stenosis. Tricuspid Valve: The tricuspid valve is normal in structure. Tricuspid valve regurgitation is trivial. No evidence of tricuspid stenosis. Aortic Valve: The aortic valve is normal in structure. Aortic valve regurgitation is not visualized. No aortic stenosis is present. Pulmonic  Valve: The pulmonic valve was grossly normal. Pulmonic valve regurgitation is mild. Aorta: Aortic dilatation noted. There is borderline dilatation of the aortic root, measuring 38 mm. There is minimal (Grade I) plaque involving the descending aorta. IAS/Shunts: There is redundancy of the interatrial septum. Evidence of atrial level shunting detected by color flow Doppler. Agitated saline contrast was given intravenously to evaluate for intracardiac shunting. Agitated saline contrast bubble study was  positive with shunting observed within 3-6 cardiac cycles suggestive of interatrial shunt.  AORTIC VALVE LVOT Vmax:   106.00 cm/s LVOT Vmean:  68.500 cm/s LVOT VTI:    0.167 m  SHUNTS Systemic VTI: 0.17 m Cherlynn Kaiser MD Electronically signed by Cherlynn Kaiser MD Signature Date/Time: 03/21/2020/12:38:38 PM    Final    CT HEAD CODE STROKE WO CONTRAST  Result Date: 03/16/2020 CLINICAL DATA:  Code stroke. Initial evaluation for acute neuro deficit, stroke suspected. EXAM: CT HEAD WITHOUT CONTRAST TECHNIQUE: Contiguous axial images were  obtained from the base of the skull through the vertex without intravenous contrast. COMPARISON:  None. FINDINGS: Brain: Cerebral volume within normal limits for patient age. No acute intracranial hemorrhage. There is subtle hypodensity involving the mid and posterior left frontal cortex with involvement of the precentral gyrus,, suspicious for evolving acute left MCA distribution infarct (series 2, image 25). No other definite large vessel territory infarct. No mass lesion, midline shift or mass effect. No hydrocephalus or extra-axial fluid collection. Vascular: No visible hyperdense vessel. Skull: Scalp soft tissues and calvarium within normal limits. Sinuses/Orbits: Globes and orbital soft tissues within normal limits. Paranasal sinuses are clear. No mastoid effusion. Other: None. ASPECTS [facility] Stroke Program Early CT Score) - Ganglionic level infarction (caudate, lentiform  nuclei, internal capsule, insula, M1-M3 cortex): 7 - Supraganglionic infarction (M4-M6 cortex): 1 Total score (0-10 with 10 being normal): 8 IMPRESSION: 1. Subtle evolving hypodensity involving the supra ganglionic mid and posterior left frontal lobe, concerning for acute left MCA distribution infarct. No intracranial hemorrhage. 2. ASPECTS is 8. Critical Value/emergent results were called by telephone at the time of interpretation on 03/16/2020 at 10:45 pm to provider Dr. Cheral Marker, who verbally acknowledged these results. Electronically Signed   By: Jeannine Boga M.D.   On: 03/16/2020 23:00   VAS [place] LOWER EXTREMITY VENOUS (DVT)  Result Date: 03/17/2020  Lower Venous DVT Study Other Indications: Embolic stroke. Risk Factors: None identified. Comparison Study: No previous exam Performing Technologist: Vonzell Schlatter RVT  Examination Guidelines: A complete evaluation includes B-mode imaging, spectral Doppler, color Doppler, and power Doppler as needed of all accessible portions of each vessel. Bilateral testing is considered an integral part of a complete examination. Limited examinations for reoccurring indications may be performed as noted. The reflux portion of the exam is performed with the patient in reverse Trendelenburg.  +---------+---------------+---------+-----------+----------+--------------+ RIGHT    CompressibilityPhasicitySpontaneityPropertiesThrombus Aging +---------+---------------+---------+-----------+----------+--------------+ CFV      Full           Yes      Yes                                 +---------+---------------+---------+-----------+----------+--------------+ SFJ      Full                                                        +---------+---------------+---------+-----------+----------+--------------+ FV Prox  Full                                                        +---------+---------------+---------+-----------+----------+--------------+ FV Mid    Full                                                        +---------+---------------+---------+-----------+----------+--------------+ FV DistalFull                                                        +---------+---------------+---------+-----------+----------+--------------+  PFV      Full                                                        +---------+---------------+---------+-----------+----------+--------------+ POP      Full           Yes      Yes                                 +---------+---------------+---------+-----------+----------+--------------+ PTV      Full                                                        +---------+---------------+---------+-----------+----------+--------------+ PERO     Full                                                        +---------+---------------+---------+-----------+----------+--------------+   +---------+---------------+---------+-----------+----------+--------------+ LEFT     CompressibilityPhasicitySpontaneityPropertiesThrombus Aging +---------+---------------+---------+-----------+----------+--------------+ CFV      Full           Yes      Yes                                 +---------+---------------+---------+-----------+----------+--------------+ SFJ      Full                                                        +---------+---------------+---------+-----------+----------+--------------+ FV Prox  Full                                                        +---------+---------------+---------+-----------+----------+--------------+ FV Mid   Full                                                        +---------+---------------+---------+-----------+----------+--------------+ FV DistalFull                                                        +---------+---------------+---------+-----------+----------+--------------+ PFV      Full                                                         +---------+---------------+---------+-----------+----------+--------------+  POP      Full           Yes      Yes                                 +---------+---------------+---------+-----------+----------+--------------+ PTV      Full                                                        +---------+---------------+---------+-----------+----------+--------------+ PERO     Full                                                        +---------+---------------+---------+-----------+----------+--------------+  Summary: BILATERAL: - No evidence of deep vein thrombosis seen in the lower extremities, bilaterally. -No evidence of popliteal cyst, bilaterally.   *See table(s) above for measurements and observations. Electronically signed by Servando Snare MD on 03/17/2020 at 1:58:32 PM.    Final    CT ANGIO HEAD CODE STROKE  Result Date: 03/16/2020 CLINICAL DATA:  Initial evaluation for acute stroke, receptive and expressive aphasia. EXAM: CT ANGIOGRAPHY HEAD AND NECK TECHNIQUE: Multidetector CT imaging of the head and neck was performed using the standard protocol during bolus administration of intravenous contrast. Multiplanar CT image reconstructions and MIPs were obtained to evaluate the vascular anatomy. Carotid stenosis measurements (when applicable) are obtained utilizing NASCET criteria, using the distal internal carotid diameter as the denominator. CONTRAST:  184mL OMNIPAQUE IOHEXOL 350 MG/ML SOLN COMPARISON:  Prior head CT from earlier the same day. FINDINGS: CTA NECK FINDINGS Aortic arch: Visualized aortic arch of normal caliber with normal branch pattern. No hemodynamically significant stenosis seen about the origin of the great vessels. Right carotid system: Right common and internal carotid arteries patent without stenosis, dissection or occlusion. Left carotid system: Left common and internal carotid arteries patent without stenosis, dissection or occlusion. Vertebral  arteries: Both vertebral arteries arise from the subclavian arteries. Vertebral arteries widely patent without stenosis, dissection or occlusion. Skeleton: No visible acute osseous abnormality. No discrete or worrisome osseous lesions. Other neck: No other acute soft tissue abnormality within the neck. No mass or adenopathy. Upper chest: Small layering right pleural effusion noted. Partially visualized lungs and upper chest demonstrate no other acute finding. Review of the MIP images confirms the above findings CTA HEAD FINDINGS Anterior circulation: Both internal carotid arteries patent to the termini without stenosis. A1 segments patent bilaterally. Normal anterior communicating artery complex. Anterior cerebral arteries patent to their distal aspects without stenosis. No M1 stenosis or occlusion. Normal MCA bifurcations. No proximal MCA branch occlusion. Mild irregularity of distal left MCA branches noted, which could be related to recent recanalization. No visible proximal branch occlusion. Posterior circulation: Both V4 segments patent to the vertebrobasilar junction without stenosis. Both PICA origins patent and normal. Basilar patent to its distal aspect without stenosis. Superior cerebellar arteries patent bilaterally. Right PCA supplied via the basilar. Predominant fetal type origin of the left PCA. Both PCAs well perfused to their distal aspects without stenosis. Venous sinuses: Patent. Anatomic variants: Fetal type origin left PCA.  No aneurysm. Review of the  MIP images confirms the above findings IMPRESSION: 1. Negative CTA for large vessel occlusion. No high-grade or correctable stenosis identified. 2. Mild irregularity of distal left MCA branches, which could be related to recent recanalization. 3. Small layering right pleural effusion. Critical Value/emergent results were called by telephone at the time of interpretation on 03/16/2020 at 10:55 pm to provider ERIC [facility] , who verbally acknowledged  these results. Electronically Signed   By: Jeannine Boga M.D.   On: 03/16/2020 23:23       HISTORY OF PRESENT ILLNESS Cody Henson is a 45 y.o. male with PMH of depression who presented with acute onset of dense expressive and receptive aphasia with right-sided weakness.  CT head revealed subtle hypodensity in left MCA territory.  CTA of head and neck showed no no large vessel occlusion.  Patient received tPA at 10:40 PM on 03/16/2020.   HOSPITAL COURSE Mr. Graus was admitted to the neurologic ICU for intensive monitoring post tPA administration. Stroke work up continued. MRI showed left MCA scattered moderate large infarct with associated mild petechial hemorrhage without hemorrhagic transformation.  2D echo EF 60-65%, LE venous Doppler no DVT.  TEE showed Positive PFO with bubbles crossing the atrial septum within 1-2 heart beats, Grade 2 during rest and Grade 3 on valsalva. Right to left shunt. Stroke labs were obtained. LDL was 149 and A1c was normal at  5.1.  UDS positive for THC. Hypercoagulable work up is thus far negative with Prothrombin Gene Mutation still pending. DAPT and statin, Lipitor 80mg , were initiated. He remained neurologically and hemodynamically stable. He was transferred to the floor and progressed well with improvement noted in his aphasia. Urinary retention was noted with urecholine initiated, foley currently in place. Voiding trial once settled in CIR appropriate. Physical, speech and occupational therapists provided evaluation, treatment and discharge recommendations. Minimal swallow impact was noted. He was able to tolerate a regular diet. CIR was recommended and arranged to address post stroke deficits. Patient was discharged to CIR in good condition.   DISCHARGE EXAM Blood pressure (!) 149/101, pulse 71, temperature 97.9 F (36.6 C), temperature source Oral, resp. rate 15, height 5\' 8"  (1.727 m), weight 75.1 kg, SpO2 97 %. PHYSICAL EXAM Exam by Dr. Clydene Fake progress  note today: General: Well-developed, well-nourished Caucasian male sitting comfortably in bed, NAD HEENT: [place]/AT, MMM, PERRLA Cardiovascular: Regular rate and rhythm Respiratory: No respiratory distress Abdomen: Soft, nondistended, no tenderness or rigidity Psych: Normal mood and affect Neurological: Mental Status: Alert, awake, unable to assess orientation.  Expressive aphasia, not able to name, repeat, but makes meaningful sounds.  Able to follow midline commands and peripheral commands.  Makes paraphasic errors.  Good comprehension Cranial Nerves: II:  V right hemianopia improved, pupils equal, round, reactive to light and accommodation III,IV, VI: ptosis not present, extra-ocular motions intact bilaterally V,VII: smile asymmetric, right nasolabial fold flattening facial light touch sensation normal bilaterally VIII: hearing normal bilaterally IX,X: uvula rises symmetrically XI: bilateral shoulder shrug XII: midline tongue extension without atrophy or fasciculations Motor: Right :  Upper extremity   4/5                                      left:     Upper extremity   5/5             Lower extremity   4/5  Lower extremity   5/5 Tone and bulk:normal tone throughout; no atrophy noted Sensory: Assess Deep Tendon Reflexes:  Right: Upper Extremity                       Left: Upper extremity    biceps (C-5 to C-6) 2/4                       biceps (C-5 to C-6) 2/4 tricep (C7) 2/4                                     triceps (C7) 2/4 Brachioradialis (C6) 2/4                      Brachioradialis (C6) 2/4   Lower Extremity Lower Extremity  quadriceps (L-2 to L-4) 2/4                 quadriceps (L-2 to L-4) 2/4 Achilles (S1) 2/4                                  Achilles (S1) 2/4 Plantars: Right: downgoing                                Left: downgoing Cerebellar: Unable to access. Gait: Deferred  Discharge Diet      Diet   Diet regular  Room service appropriate? Yes; Fluid consistency: Thin   liquids  DISCHARGE PLAN Disposition:  Transfer to [facility] for ongoing PT, OT and ST Aspirin 81 mg and Plavix 75mg  DAPT for 3 weeks and then ASA alone Recommend ongoing stroke risk factor control by Primary Care Physician at time of discharge from inpatient rehabilitation. Follow-up PCP Patient, No Pcp Per in 2 weeks following discharge from rehab. Follow-up in [facility] in 4 weeks following discharge from rehab, office to schedule an appointment.  PFO: Dr. Burt Knack interventional cardiologist who will see the patient as an outpatient and discuss closure, arranged by Dr. Leonie Man   37minutes were spent preparing discharge. Delila A Bailey-Modzik, NP-C  I have personally obtained history,examined this patient, reviewed notes, independently viewed imaging studies, participated in medical decision making and plan of care.ROS completed by me personally and pertinent positives fully documented  I have made any additions or clarifications directly to the above note. Agree with note above.   Antony Contras, MD Medical Director [facility] Pager: [phone] 03/23/2020 3:31 PM

## 2020-03-23 NOTE — Progress Notes (Signed)
STROKE TEAM PROGRESS NOTE   INTERVAL HISTORY No acute events in past 24 hours.  He is sitting comfortable in chair.  Vitals stable.  Continues to show improvement with fluency of speech and naming.  He denies any complaints. Vitals stable. Afebrile.  Neuro exam unchanged. Pt is medically stable to go to rehab. His insurance may not start for 3-5 days.Wife would like to persue CIR as self pay.   Vitals:   03/22/20 1943 03/23/20 0020 03/23/20 0344 03/23/20 0754  BP: (!) 146/97 132/89 127/78 (!) 142/102  Pulse: 67 62 (!) 59 65  Resp: 16 20 14 11   Temp: 98.3 F (36.8 C) 98.7 F (37.1 C) 98.5 F (36.9 C) 98.2 F (36.8 C)  TempSrc: Oral Oral Oral Oral  SpO2: 96% 97% 98% 95%  Weight:      Height:       CBC:  Recent Labs  Lab 03/16/20 2220 03/16/20 2233 03/21/20 0207 03/22/20 0310  WBC 15.1*   < > 12.3* 9.1  NEUTROABS 12.4*  --   --   --   HGB 15.6   < > 14.9 14.0  HCT 44.6   < > 44.5 41.8  MCV 87.8   < > 87.3 87.1  PLT 295   < > 271 284   < > = values in this interval not displayed.   Basic Metabolic Panel:  Recent Labs  Lab 03/21/20 0207 03/22/20 0310  NA 137 138  K 3.8 3.9  CL 102 103  CO2 24 24  GLUCOSE 103* 92  BUN 10 12  CREATININE 1.07 1.02  CALCIUM 9.2 9.0   Lipid Panel:  Recent Labs  Lab 03/17/20 0344  CHOL 212*  TRIG 113  HDL 40*  CHOLHDL 5.3  VLDL 23  LDLCALC 149*   HgbA1c:  Recent Labs  Lab 03/17/20 0344  HGBA1C 5.1   Urine Drug Screen: Positive for THC  IMAGING past 24 hours  CT Head code stroke wo contrast 03/16/2020  IMPRESSION: 1. Subtle evolving hypodensity involving the supra ganglionic mid and posterior left frontal lobe, concerning for acute left MCA distribution infarct. No intracranial hemorrhage. 2. ASPECTS is 8.  CT Angio Head Neck w or wo contrast 03/16/2020  IMPRESSION: 1. Negative CTA for large vessel occlusion. No high-grade or correctable stenosis identified. 2. Mild irregularity of distal left MCA branches, which  could be related to recent recanalization. 3. Small layering right pleural effusion.   MR Brain wo contrast 03/17/2020  Patchy multifocal areas of restricted diffusion seen involving the cortical and subcortical aspect of the left frontal and parietal lobes as well as the left temporal occipital region, consistent with acute ischemic infarcts, left MCA distribution. Associated mild scattered petechial hemorrhage without frank hemorrhagic transformation. No significant regional mass effect.  VAS [place] Lower extremity 03/17/2020  Summary:  BILATERAL:  - No evidence of deep vein thrombosis seen in the lower extremities,  bilaterally.  -No evidence of popliteal cyst, bilaterally.   Echocardiogram 03/17/2020  1. Left ventricular ejection fraction, by estimation, is 60 to 65%. The  left ventricle has normal function. The left ventricle has no regional  wall motion abnormalities. Left ventricular diastolic parameters were  normal.   2. Right ventricular systolic function is normal. The right ventricular  size is normal. Tricuspid regurgitation signal is inadequate for assessing  PA pressure.   3. The mitral valve is normal in structure. No evidence of mitral valve  regurgitation. No evidence of mitral stenosis.   4. The  aortic valve is tricuspid. Aortic valve regurgitation is not  visualized. No aortic stenosis is present.   5. Aortic dilatation noted. There is mild dilatation of the aortic root,  measuring 38 mm.   6. The inferior vena cava is normal in size with greater than 50%  respiratory variability, suggesting right atrial pressure of 3 mmHg.   PHYSICAL EXAM  General: Well-developed, well-nourished Caucasian male sitting comfortably in bed, NAD HEENT: [place]/AT, MMM, PERRLA Cardiovascular: Regular rate and rhythm Respiratory: No respiratory distress Abdomen: Soft, nondistended, no tenderness or rigidity Psych: Normal mood and affect Neurological:  Mental Status: Alert,  awake, unable to assess orientation.  Expressive aphasia, not able to name, repeat, but makes meaningful sounds. Able to read simple words from book.   Able to follow midline commands and peripheral commands.  Makes paraphasic errors.  Good comprehension Cranial Nerves: II:  V right hemianopia improved, pupils equal, round, reactive to light and accommodation III,IV, VI: ptosis not present, extra-ocular motions intact bilaterally V,VII: smile asymmetric, right nasolabial fold flattening facial light touch sensation normal bilaterally VIII: hearing normal bilaterally IX,X: uvula rises symmetrically XI: bilateral shoulder shrug XII: midline tongue extension without atrophy or fasciculations  Motor: Right : Upper extremity   4/5 Distal muscle weakness but no drift.  left:     Upper extremity   5/5  Lower extremity   4/5                             Lower extremity   5/5 Tone and bulk:normal tone throughout; no atrophy noted Sensory: Assess  Cerebellar: Unable to access. Gait: Deferred  ASSESSMENT/PLAN Mr. Mollie Calleros is a 45 y.o. male with unknown past medical history presented with acute onset of dense expressive and receptive aphasia with right-sided weakness. CT head with subtle hypodensity in left MCA territory. CTA of head and neck with no large vessel occlusion.  Patient received TPA at 10:40 PM on 03/16/2020.  MRA head shows patchy multifocal area of restricted diffusion seen involving the cortical and subcortical aspect of left frontal and parietal lobes as well as left temporal occipital region, consistent with acute ischemic infarct, left MCA distribution.  Stroke: Acute ischemic infarct in left MCA distribution s/p tPA likely embolic in origin, unclear source at this point.  Code Stroke CT head: Subtle evolving hypodensity involving the supra ganglionic mid and posterior left frontal lobe, concerning for acute left MCA distribution infarct. ASPECTS 8.  CTA head & neck:  Negative CTA  for large vessel occlusion. Mild irregularity of distal left MCA branches, which could be related to recent recanalization.  MRI: Patchy multifocal areas of restricted diffusion seen involving the cortical and subcortical aspect of the left frontal and parietal lobes as well as the left temporal occipital region, consistent with acute ischemic infarcts, left MCA distribution  Doppler US Lower extremities: No evidence of deep vein thrombosis  2D Echo:  EF 60 to 65%.  TEE - Positive PFO with bubbles crossing the atrial septum within 1-2 heart beats. Right to left shunt.  TCD bubble study: Positive with Grade 2 during rest and Grade 3 on valsalva  LDL 149  HgbA1c 5.1  UDS + THC  Hypercoagulable and autoimmune work up -> all are normal except  Prothrombin Gene Mutation which is currently pending  VTE prophylaxis -Lovenox 40 mg  No antithrombotic prior to admission, now on aspirin 81 mg and Plavix 75mg  DAPT for 3 weeks and then  ASA alone  Therapy recommendations: CIR   Disposition: Pending  PFO  TCD bubble study: Positive with Grade 2 during rest and Grade 3 on valsalva   TEE : Positive PFO with bubbles crossing the atrial septum within 1-2 heart beats. Right to left shunt.  May consider PFO closure if no other stroke source found  Hypertension  Home meds: None  Stable . SBP< 180/105 after tPA  . Long-term BP goal normotensive  Hyperlipidemia  Home meds: None  LDL 149, goal < 70  Start lipitor 80  Continue statin at discharge  Urinary retention   s/p in and out x 3  S/p foley catheter now  On urecholine 10mg  tid  Other Stroke Risk Factors  F/Hx of Stroke maternal grandfather and maternal aunts and uncles.  UDS + for THC, cessation education provided  Other Active Problems  Depression  Additional Labs  Cardiolipin antibodies - < 9- normal  Prothrombin Gene Mutation-pending  Lupus Anticoagulant - 29.8- normal  Beta - 2 Glyco 1 IgG - < 9-  normal  Homocysteine -7.4 - normal  DRVVT - 36.7 - normal  ANCA Proteinase 3 - < 3.5 - normal  ANA, IFA with reflex - negative  CRP - 0.5- normal  Sed rate - 10 - normal  RPR - non reactive - normal  Hospital day # 6 I have personally obtained history,examined this patient, reviewed notes, independently viewed imaging studies, participated in medical decision making and plan of care.ROS completed by me personally and pertinent positives fully documented  I have made any additions or clarifications directly to the above note. Agree with note above.  Continue mobilize out of bed and ongoing therapies.  Transfer to inpatient rehab when available next few days  Antony Contras, [place] Pager: [phone] 03/23/2020 1:46 PM    To contact Stroke Continuity provider, please refer to [email or web address] After hours, contact General Neurology

## 2020-03-23 NOTE — Progress Notes (Signed)
Inpatient Rehab Admissions Coordinator:  ° °I have a CIR bed for this pt. Today. RN may call [phone] for report.  ° °Laurencia Roma, MS, CCC-SLP °Rehab Admissions Coordinator  °[phone] (celll) °[phone] (office) ° ° °

## 2020-03-23 NOTE — Progress Notes (Signed)
PMR Admission Coordinator Pre-Admission Assessment  Patient: Cody Henson is an 45 y.o., male MRN: [id number] DOB: 11/29/1975 Height: 5\' 8"  (172.7 cm) Weight: 75.1 kg                                                                                                                                                  Insurance Information HMO:     PPO:      PCP:      IPA:      80/20:      OTHER:  PRIMARY: Uninsured  SECONDARY:       Policy#:       Phone#:   Development worker, community:       Phone#:   The Engineer, [employer]" for patients in Inpatient Rehabilitation Facilities with attached "Privacy Act [place] Records" was provided and verbally reviewed with: N/A  Emergency Contact Information Contact Information     Name Relation Home Work [place] Spouse   [phone]      Current Medical History  Patient Admitting Diagnosis: CVA History of Present Illness: Cody Henson is a 45 year old right-handed male with history of depression maintained on Adderall as well as Zoloft. Per chart review patient lives with spouse and 63-year-old child. Two-level home 2 steps to entry. Independent prior to admission. 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 Presented 03/16/2020 with right side weakness facial droop and aphasia. Admission chemistries unremarkable except potassium 3.4, glucose 116, WBC 15,100, SARS coronavirus negative, urine drug screen positive marijuana. Cranial CT scan showed subtle evolving hypodensity involving the supra ganglionic mid posterior left frontal lobe concerning for acute left MCA distribution infarction. No hemorrhage noted. CT angiogram head and neck negative for large vessel occlusion. No high-grade or correctable stenosis identified. Patient did receive TPA. MRI showed patchy multifocal acute ischemic infarct involving the left cerebral hemisphere. Associated mild scattered petechial hemorrhage  without frank hemorrhagic transformation. No significant regional mass-effect. Echocardiogram with ejection fraction of 60 to 65% no wall motion abnormalities. Bilateral lower extremity Dopplers negative. TEE positive for PFO with bubbles crossing the atrial septum within 1-2 heartbeats. No current plan for PFO closure follow-up per cardiology services. Placed on aspirin 81 mg daily and Plavix 75 mg daily for 3 weeks and aspirin alone. Subcutaneous Lovenox for DVT prophylaxis. Bouts of urinary retention placed on Urecholine. Tolerating a regular consistency diet. Due to patient's right side weakness and dysarthria he was admitted for a comprehensive rehab program.  Complete NIHSS TOTAL: 8 Glasgow Coma Scale Score: 15  Past Medical History      Past Medical History:  Diagnosis Date  . Depressed     Family History  family history includes Stroke in his maternal aunt and paternal grandfather.  Prior Rehab/Hospitalizations:  Has the patient had prior rehab or hospitalizations prior to admission?  No  Has the patient had major surgery during 100 days prior to admission? No  Current Medications   Current Facility-Administered Medications:  .  acetaminophen (TYLENOL) tablet 650 mg, 650 mg, Oral, Q4H PRN **OR** acetaminophen (TYLENOL) 160 MG/5ML solution 650 mg, 650 mg, Per Tube, Q4H PRN **OR** acetaminophen (TYLENOL) suppository 650 mg, 650 mg, Rectal, Q4H PRN, Kerney Elbe, MD .  aspirin chewable tablet 81 mg, 81 mg, Oral, Daily, Kerney Elbe, MD, 81 mg at 03/23/20 0819 .  atorvastatin (LIPITOR) tablet 80 mg, 80 mg, Oral, Daily, Rosalin Hawking, MD, 80 mg at 03/23/20 0820 .  bethanechol (URECHOLINE) tablet 10 mg, 10 mg, Oral, TID, Rosalin Hawking, MD, 10 mg at 03/23/20 0820 .  Chlorhexidine Gluconate Cloth 2 % PADS 6 each, 6 each, Topical, Daily, Kerney Elbe, MD, 6 each at 03/23/20 0820 .  clopidogrel (PLAVIX) tablet 75 mg, 75 mg, Oral, Daily, Dagar, Anjali, MD, 75 mg at 03/23/20  0820 .  enoxaparin (LOVENOX) injection 40 mg, 40 mg, Subcutaneous, Q24H, Dagar, Anjali, MD, 40 mg at 03/23/20 1334 .  labetalol (NORMODYNE) injection 5-10 mg, 5-10 mg, Intravenous, Q2H PRN, Rosalin Hawking, MD .  pantoprazole (PROTONIX) EC tablet 40 mg, 40 mg, Oral, Daily, Rosalin Hawking, MD, 40 mg at 03/23/20 0820 .  senna-docusate (Senokot-S) tablet 1 tablet, 1 tablet, Oral, QHS PRN, Kerney Elbe, MD, 1 tablet at 03/21/20 1557 .  sertraline (ZOLOFT) tablet 50 mg, 50 mg, Oral, Daily, [place], Kofi, MD, 50 mg at 03/23/20 7169  Patients Current Diet:  Diet Order             Diet regular Room service appropriate? 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  Independent  Indoor Mobility: Did the patient need assistance with walking from room to room (with or without device)? Independent  Stairs: Did the patient need assistance with internal or external stairs (with or without device)? Independent  Functional Cognition: Did the patient need help planning regular tasks such as shopping or remembering to take medications? Independent  Home Assistive Devices / Equipment Home Assistive Devices/Equipment: None Home Equipment: None  Prior Device Use: Indicate devices/aids used by the patient prior to current illness, exacerbation or injury? None of the above  Current Functional Level Cognition  Arousal/Alertness: Awake/alert Overall Cognitive Status:  Impaired/Different from baseline Current Attention Level: Sustained Orientation Level: Oriented to person,Oriented to place,Oriented to situation Following Commands: Follows one step commands consistently,Follows one step commands with increased time,Follows multi-step commands with increased time,Follows multi-step commands inconsistently Safety/Judgement: Decreased awareness of safety,Decreased awareness of deficits General Comments: pt needs redirection to R sided deficits at times. Has difficulty processing more than one instruction at a time or instructions while mobilizing Attention: Sustained Sustained Attention: Appears intact Memory:  (to be assessed) Awareness: Appears intact (for his verbal errors) Problem Solving: Appears intact Safety/Judgment: Appears intact (?? will continue to assess)    Extremity Assessment (includes Sensation/Coordination)  Upper Extremity Assessment: Defer  to OT evaluation RUE Deficits / Details: Flexor synergy. Overshoots, undershoots. Severe dysmetria. RUE Sensation: decreased light touch,decreased proprioception RUE Coordination: decreased fine motor,decreased gross motor  Lower Extremity Assessment: RLE deficits/detail RLE Deficits / Details: moves against gravity weakly, incoordinated and motor apraxic.  Pt report numbness and does not have strong proprioceptive input.  Pt not aware of where is foot is in stance or the position of his knee in stance with visual confirmation. RLE Sensation: decreased proprioception RLE Coordination: decreased fine motor    ADLs  Overall ADL's : Needs assistance/impaired Eating/Feeding: Moderate assistance Eating/Feeding Details (indicate cue type and reason): Unable to use RUE functionally during self-feeding. Uses non-dominant LUE. patient unaware of food on R side of face 2/2 decreased sensation. Grooming: Wash/dry hands,Wash/dry face,Moderate assistance,Sitting Grooming Details (indicate cue type and reason):  increased time, mod A for assist with RUE and cues how to use LUE to assist RUE Upper Body Bathing: Moderate assistance,Sitting Lower Body Bathing: Moderate assistance,Sit to/from stand Lower Body Bathing Details (indicate cue type and reason): can perform figure 4 on L but not R Upper Body Dressing : Moderate assistance,Sitting Lower Body Dressing: Moderate assistance Lower Body Dressing Details (indicate cue type and reason): con don sock on L foot but not right, did attempt to engage RUE with cues Toilet Transfer: Moderate assistance,+2 for physical assistance,+2 for safety/equipment Toilet Transfer Details (indicate cue type and reason): 2 person HHA. blocking for LLE and support for RUE Toileting- Clothing Manipulation and Hygiene: Maximal assistance Functional mobility during ADLs: Moderate assistance,+2 for physical assistance,+2 for safety/equipment (2 person HHA)    Mobility  Overal bed mobility: Needs Assistance Bed Mobility: Supine to Sit,Rolling Rolling: Supervision Supine to sit: Supervision Sit to supine: Mod assist,+2 for safety/equipment (assist to manage RUE (to prevent flopping around) and RLE back into bed) General bed mobility comments: Pt rolled 1x each direction and transitioned supine > sit R EOB with bed flat and supervision for safety, extra time for all and assistance with covers management.    Transfers  Overall transfer level: Needs assistance Equipment used: Quad cane Transfers: Sit to/from Stand Sit to Stand: Min assist Stand pivot transfers: Mod assist General transfer comment: Cued pt to review "stop sign" pose of R elbow extension with writst extension several times prior to sit to stand to prepare for push-up movement of arms on bed. Cued pt to match R foot placement to L, needing extra time and minA for steadying to come to stand.    Ambulation / Gait / Stairs / Wheelchair Mobility  Ambulation/Gait Ambulation/Gait assistance: Web designer (Feet): 200 Feet Assistive device: Quad cane Gait Pattern/deviations: Step-to pattern,Ataxic,Step-through pattern,Decreased step length - left,Decreased stance time - right,Decreased dorsiflexion - right,Decreased weight shift to right General Gait Details: Pt ambulates with quad-cane in L hand, cuing pt initially for 3-point gait pattern with improved success and ease with increased distance. Pt displays step-to pattern with decreased R stance and weight shift thus decreased L step length, cued to improve with mod success. Cued pt to perform knee and hip flexion with ankle dorsiflexion during swing for improved foot clearance and sequence heel-to-toe during stance, mod success. Cued pt to relax R arm by side, needing to hold PT's hand with assistance to keep it there. Difficulty combining multiple steps at once. Increased knee instability and trunk sway with fatigue. Gait velocity: reduced Gait velocity interpretation: <1.8 ft/sec, indicate of risk for recurrent falls    Posture / Balance Dynamic Sitting Balance  Sitting balance - Comments: Sitting EOB no LOB, supervision for safety. Balance Overall balance assessment: Needs assistance Sitting-balance support: No upper extremity supported,Feet unsupported Sitting balance-Leahy Scale: Good Sitting balance - Comments: Sitting EOB no LOB, supervision for safety. Standing balance support: Single extremity supported Standing balance-Leahy Scale: Poor Standing balance comment: dependent on AD as well as external support    Special needs/care consideration Designated visitor [place] (from acute therapy documentation) Living Arrangements: Spouse/significant other  Lives With: Spouse Available Help at Discharge: Family,Available 24 hours/day Type of Home: House Home Layout: Two level Alternate Level Stairs-Number of Steps: flight Home Access: Stairs to enter Entrance Stairs-Rails:  Surveyor, mining of Steps: 10 Bathroom Shower/Tub: Multimedia programmer: Standard Home Care Services: No  Discharge Living Setting Plans for Discharge Living Setting: Patient's home Type of Home at Discharge: House Discharge Home Layout: Two level,Able to live on main level with bedroom/bathroom Alternate Level Stairs-Rails: Right Alternate Level Stairs-Number of Steps: full flight Discharge Home Access: Stairs to enter Entrance Stairs-Rails: Right,Left,Can reach both Entrance Stairs-Number of Steps: 4 Discharge Bathroom Shower/Tub: Tub/shower unit Discharge Bathroom Toilet: Standard Discharge Bathroom Accessibility: Yes How Accessible: Accessible via walker Does the patient have any problems obtaining your medications?: No  Social/Family/Support Systems Patient Roles: Spouse Contact Information: [phone] Anticipated Caregiver: bartolome durocher Anticipated Caregiver's Contact Information: [phone] Ability/Limitations of Caregiver: none Caregiver Availability: 24/7 Discharge Plan Discussed with Primary Caregiver: Yes Is Caregiver In Agreement with Plan?: Yes Does Caregiver/Family have Issues with Lodging/Transportation while Pt is in Rehab?: No   Goals Patient/Family Goal for Rehab: PT/OT Supervision, SLP Supervision/Min A Expected length of stay: 8-12 days Pt/Family Agrees to Admission and willing to participate: Yes Program Orientation Provided & Reviewed with Pt/Caregiver Including Roles  & Responsibilities: Yes   Decrease burden of Care through IP rehab admission: Specialzed equipment needs, Decrease number of caregivers and Patient/family education   Possible need for SNF placement upon discharge:not anticipated   Patient Condition: This patient's medical and functional status has changed since the consult dated: 03/18/2020  in which the Rehabilitation Physician determined and documented that the patient's condition is  appropriate for intensive rehabilitative care in an inpatient rehabilitation facility. See "History of Present Illness" (above) for medical update. Functional changes are: Pt now min A with most ADLs, transfers, and ambulation.  Patient's medical and functional status update has been discussed with the Rehabilitation physician and patient remains appropriate for inpatient rehabilitation. Will admit to inpatient rehab today.  Preadmission Screen Completed By:  Genella Mech, CCC-SLP, 03/23/2020 2:37 PM ______________________________________________________________________   Discussed status with Dr. Posey Pronto on 03/23/20  at 1430 and received approval for admission today.  Admission Coordinator:  Genella Mech, time 1430/Date 03/23/20

## 2020-03-23 NOTE — Progress Notes (Signed)
PMR Admission Coordinator Pre-Admission Assessment  Patient: Cody Henson is an 45 y.o., male MRN: [id number] DOB: 1975-07-09 Height: 5\' 8"  (172.7 cm) Weight: 75.1 kg                                                                                                                                                  Insurance Information HMO:     PPO:      PCP:      IPA:      80/20:      OTHER:  PRIMARY: Uninsured  SECONDARY:       Policy#:       Phone#:   Development worker, community:       Phone#:   The Engineer, petroleum" for patients in Inpatient Rehabilitation Facilities with attached "Privacy Act Searchlight Records" was provided and verbally reviewed with: N/A  Emergency Contact Information Contact Information     Name Relation Home Work [place] Spouse   [phone]      Current Medical History  Patient Admitting Diagnosis: CVA History of Present Illness: Cody Henson is a 45 year old right-handed male with history of depression maintained on Adderall as well as Zoloft. Per chart review patient lives with spouse and 72-year-old child. Two-level home 2 steps to entry. Independent prior to admission. 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 Presented 03/16/2020 with right side weakness facial droop and aphasia. Admission chemistries unremarkable except potassium 3.4, glucose 116, WBC 15,100, SARS coronavirus negative, urine drug screen positive marijuana. Cranial CT scan showed subtle evolving hypodensity involving the supra ganglionic mid posterior left frontal lobe concerning for acute left MCA distribution infarction. No hemorrhage noted. CT angiogram head and neck negative for large vessel occlusion. No high-grade or correctable stenosis identified. Patient did receive TPA. MRI showed patchy multifocal acute ischemic infarct involving the left cerebral hemisphere. Associated mild scattered petechial hemorrhage  without frank hemorrhagic transformation. No significant regional mass-effect. Echocardiogram with ejection fraction of 60 to 65% no wall motion abnormalities. Bilateral lower extremity Dopplers negative. TEE positive for PFO with bubbles crossing the atrial septum within 1-2 heartbeats. No current plan for PFO closure follow-up per cardiology services. Placed on aspirin 81 mg daily and Plavix 75 mg daily for 3 weeks and aspirin alone. Subcutaneous Lovenox for DVT prophylaxis. Bouts of urinary retention placed on Urecholine. Tolerating a regular consistency diet. Due to patient's right side weakness and dysarthria he was admitted for a comprehensive rehab program.  Complete NIHSS TOTAL: 8 Glasgow Coma Scale Score: 15  Past Medical History      Past Medical History:  Diagnosis Date  . Depressed     Family History  family history includes Stroke in his maternal aunt and paternal grandfather.  Prior Rehab/Hospitalizations:  Has the patient had prior rehab or hospitalizations prior to admission?  No  Has the patient had major surgery during 100 days prior to admission? No  Current Medications   Current Facility-Administered Medications:  .  acetaminophen (TYLENOL) tablet 650 mg, 650 mg, Oral, Q4H PRN **OR** acetaminophen (TYLENOL) 160 MG/5ML solution 650 mg, 650 mg, Per Tube, Q4H PRN **OR** acetaminophen (TYLENOL) suppository 650 mg, 650 mg, Rectal, Q4H PRN, Kerney Elbe, MD .  aspirin chewable tablet 81 mg, 81 mg, Oral, Daily, Kerney Elbe, MD, 81 mg at 03/23/20 0819 .  atorvastatin (LIPITOR) tablet 80 mg, 80 mg, Oral, Daily, Rosalin Hawking, MD, 80 mg at 03/23/20 0820 .  bethanechol (URECHOLINE) tablet 10 mg, 10 mg, Oral, TID, Rosalin Hawking, MD, 10 mg at 03/23/20 0820 .  Chlorhexidine Gluconate Cloth 2 % PADS 6 each, 6 each, Topical, Daily, Kerney Elbe, MD, 6 each at 03/23/20 0820 .  clopidogrel (PLAVIX) tablet 75 mg, 75 mg, Oral, Daily, Dagar, Anjali, MD, 75 mg at 03/23/20  0820 .  enoxaparin (LOVENOX) injection 40 mg, 40 mg, Subcutaneous, Q24H, Dagar, Anjali, MD, 40 mg at 03/23/20 1334 .  labetalol (NORMODYNE) injection 5-10 mg, 5-10 mg, Intravenous, Q2H PRN, Rosalin Hawking, MD .  pantoprazole (PROTONIX) EC tablet 40 mg, 40 mg, Oral, Daily, Rosalin Hawking, MD, 40 mg at 03/23/20 0820 .  senna-docusate (Senokot-S) tablet 1 tablet, 1 tablet, Oral, QHS PRN, Kerney Elbe, MD, 1 tablet at 03/21/20 1557 .  sertraline (ZOLOFT) tablet 50 mg, 50 mg, Oral, Daily, [place], Kofi, MD, 50 mg at 03/23/20 7169  Patients Current Diet:  Diet Order             Diet regular Room service appropriate? 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  Independent  Indoor Mobility: Did the patient need assistance with walking from room to room (with or without device)? Independent  Stairs: Did the patient need assistance with internal or external stairs (with or without device)? Independent  Functional Cognition: Did the patient need help planning regular tasks such as shopping or remembering to take medications? Independent  Home Assistive Devices / Equipment Home Assistive Devices/Equipment: None Home Equipment: None  Prior Device Use: Indicate devices/aids used by the patient prior to current illness, exacerbation or injury? None of the above  Current Functional Level Cognition  Arousal/Alertness: Awake/alert Overall Cognitive Status:  Impaired/Different from baseline Current Attention Level: Sustained Orientation Level: Oriented to person,Oriented to place,Oriented to situation Following Commands: Follows one step commands consistently,Follows one step commands with increased time,Follows multi-step commands with increased time,Follows multi-step commands inconsistently Safety/Judgement: Decreased awareness of safety,Decreased awareness of deficits General Comments: pt needs redirection to R sided deficits at times. Has difficulty processing more than one instruction at a time or instructions while mobilizing Attention: Sustained Sustained Attention: Appears intact Memory:  (to be assessed) Awareness: Appears intact (for his verbal errors) Problem Solving: Appears intact Safety/Judgment: Appears intact (?? will continue to assess)    Extremity Assessment (includes Sensation/Coordination)  Upper Extremity Assessment: Defer  to OT evaluation RUE Deficits / Details: Flexor synergy. Overshoots, undershoots. Severe dysmetria. RUE Sensation: decreased light touch,decreased proprioception RUE Coordination: decreased fine motor,decreased gross motor  Lower Extremity Assessment: RLE deficits/detail RLE Deficits / Details: moves against gravity weakly, incoordinated and motor apraxic.  Pt report numbness and does not have strong proprioceptive input.  Pt not aware of where is foot is in stance or the position of his knee in stance with visual confirmation. RLE Sensation: decreased proprioception RLE Coordination: decreased fine motor    ADLs  Overall ADL's : Needs assistance/impaired Eating/Feeding: Moderate assistance Eating/Feeding Details (indicate cue type and reason): Unable to use RUE functionally during self-feeding. Uses non-dominant LUE. patient unaware of food on R side of face 2/2 decreased sensation. Grooming: Wash/dry hands,Wash/dry face,Moderate assistance,Sitting Grooming Details (indicate cue type and reason):  increased time, mod A for assist with RUE and cues how to use LUE to assist RUE Upper Body Bathing: Moderate assistance,Sitting Lower Body Bathing: Moderate assistance,Sit to/from stand Lower Body Bathing Details (indicate cue type and reason): can perform figure 4 on L but not R Upper Body Dressing : Moderate assistance,Sitting Lower Body Dressing: Moderate assistance Lower Body Dressing Details (indicate cue type and reason): con don sock on L foot but not right, did attempt to engage RUE with cues Toilet Transfer: Moderate assistance,+2 for physical assistance,+2 for safety/equipment Toilet Transfer Details (indicate cue type and reason): 2 person HHA. blocking for LLE and support for RUE Toileting- Clothing Manipulation and Hygiene: Maximal assistance Functional mobility during ADLs: Moderate assistance,+2 for physical assistance,+2 for safety/equipment (2 person HHA)    Mobility  Overal bed mobility: Needs Assistance Bed Mobility: Supine to Sit,Rolling Rolling: Supervision Supine to sit: Supervision Sit to supine: Mod assist,+2 for safety/equipment (assist to manage RUE (to prevent flopping around) and RLE back into bed) General bed mobility comments: Pt rolled 1x each direction and transitioned supine > sit R EOB with bed flat and supervision for safety, extra time for all and assistance with covers management.    Transfers  Overall transfer level: Needs assistance Equipment used: Quad cane Transfers: Sit to/from Stand Sit to Stand: Min assist Stand pivot transfers: Mod assist General transfer comment: Cued pt to review "stop sign" pose of R elbow extension with writst extension several times prior to sit to stand to prepare for push-up movement of arms on bed. Cued pt to match R foot placement to L, needing extra time and minA for steadying to come to stand.    Ambulation / Gait / Stairs / Wheelchair Mobility  Ambulation/Gait Ambulation/Gait assistance: Web designer (Feet): 200 Feet Assistive device: Quad cane Gait Pattern/deviations: Step-to pattern,Ataxic,Step-through pattern,Decreased step length - left,Decreased stance time - right,Decreased dorsiflexion - right,Decreased weight shift to right General Gait Details: Pt ambulates with quad-cane in L hand, cuing pt initially for 3-point gait pattern with improved success and ease with increased distance. Pt displays step-to pattern with decreased R stance and weight shift thus decreased L step length, cued to improve with mod success. Cued pt to perform knee and hip flexion with ankle dorsiflexion during swing for improved foot clearance and sequence heel-to-toe during stance, mod success. Cued pt to relax R arm by side, needing to hold PT's hand with assistance to keep it there. Difficulty combining multiple steps at once. Increased knee instability and trunk sway with fatigue. Gait velocity: reduced Gait velocity interpretation: <1.8 ft/sec, indicate of risk for recurrent falls    Posture / Balance Dynamic Sitting Balance  Sitting balance - Comments: Sitting EOB no LOB, supervision for safety. Balance Overall balance assessment: Needs assistance Sitting-balance support: No upper extremity supported,Feet unsupported Sitting balance-Leahy Scale: Good Sitting balance - Comments: Sitting EOB no LOB, supervision for safety. Standing balance support: Single extremity supported Standing balance-Leahy Scale: Poor Standing balance comment: dependent on AD as well as external support    Special needs/care consideration Designated visitor [place] (from acute therapy documentation) Living Arrangements: Spouse/significant other  Lives With: Spouse Available Help at Discharge: Family,Available 24 hours/day Type of Home: House Home Layout: Two level Alternate Level Stairs-Number of Steps: flight Home Access: Stairs to enter Entrance Stairs-Rails:  Surveyor, mining of Steps: 10 Bathroom Shower/Tub: Multimedia programmer: Standard Home Care Services: No  Discharge Living Setting Plans for Discharge Living Setting: Patient's home Type of Home at Discharge: House Discharge Home Layout: Two level,Able to live on main level with bedroom/bathroom Alternate Level Stairs-Rails: Right Alternate Level Stairs-Number of Steps: full flight Discharge Home Access: Stairs to enter Entrance Stairs-Rails: Right,Left,Can reach both Entrance Stairs-Number of Steps: 4 Discharge Bathroom Shower/Tub: Tub/shower unit Discharge Bathroom Toilet: Standard Discharge Bathroom Accessibility: Yes How Accessible: Accessible via walker Does the patient have any problems obtaining your medications?: No  Social/Family/Support Systems Patient Roles: Spouse Contact Information: [phone] Anticipated Caregiver: jaidynn esper Anticipated Caregiver's Contact Information: [phone] Ability/Limitations of Caregiver: none Caregiver Availability: 24/7 Discharge Plan Discussed with Primary Caregiver: Yes Is Caregiver In Agreement with Plan?: Yes Does Caregiver/Family have Issues with Lodging/Transportation while Pt is in Rehab?: No   Goals Patient/Family Goal for Rehab: PT/OT Supervision, SLP Supervision/Min A Expected length of stay: 8-12 days Pt/Family Agrees to Admission and willing to participate: Yes Program Orientation Provided & Reviewed with Pt/Caregiver Including Roles  & Responsibilities: Yes   Decrease burden of Care through IP rehab admission: Specialzed equipment needs, Decrease number of caregivers and Patient/family education   Possible need for SNF placement upon discharge:not anticipated   Patient Condition: This patient's medical and functional status has changed since the consult dated: 03/18/2020  in which the Rehabilitation Physician determined and documented that the patient's condition is  appropriate for intensive rehabilitative care in an inpatient rehabilitation facility. See "History of Present Illness" (above) for medical update. Functional changes are: Pt now min A with most ADLs, transfers, and ambulation.  Patient's medical and functional status update has been discussed with the Rehabilitation physician and patient remains appropriate for inpatient rehabilitation. Will admit to inpatient rehab today.  Preadmission Screen Completed By:  Genella Mech, CCC-SLP, 03/23/2020 2:37 PM ______________________________________________________________________   Discussed status with Dr. Posey Pronto on 03/23/20  at 1430 and received approval for admission today.  Admission Coordinator:  Genella Mech, time 1430/Date 03/23/20

## 2020-03-23 NOTE — Evaluation (Incomplete)
Occupational Therapy Assessment and Plan  Patient Details  Name: Cody Henson MRN: [id number] Date of Birth: 02-19-1976  OT Diagnosis: {diagnoses:3041644} Rehab Potential:   ELOS:     {CHL IP REHAB OT TIME CALCULATIONS:304400400}    Hospital Problem: Principal Problem:   Right middle cerebral artery stroke [facility])   Past Medical History:  Past Medical History:  Diagnosis Date  . Depressed    Past Surgical History:  Past Surgical History:  Procedure Laterality Date  . BUBBLE STUDY  03/21/2020   Procedure: BUBBLE STUDY;  Surgeon: [name], MD;  Location: [facility];  Service: Cardiology;;  . TEE WITHOUT CARDIOVERSION N/A 03/21/2020   Procedure: TRANSESOPHAGEAL ECHOCARDIOGRAM (TEE);  Surgeon: [name], MD;  Location: [place];  Service: Cardiology;  Laterality: N/A;    Assessment & Plan Clinical Impression: Patient is a 45 y.o. year old male with history of depression maintained on Adderall as well as Zoloft. History from chart review due to aphasia. Patient lives with spouse and 33-year-old child. Wife is expecting in June. Two-level home 2 steps to entry. Independent prior to admission. 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 He presented on 03/16/2018 with right hemiparesis, facial droop and aphasia. Admission chemistries unremarkable except potassium 3.4, glucose 116, WBCs 15,100, SARS coronavirus negative, urine drug screen positive marijuana. Cranial CT scan showed subtle evolving hypodensity involving the supra ganglionic mid posterior left frontal lobe concerning for acute left MCA distribution infarct. No hemorrhage noted. CT angiogram head and neck negative for large vessel occlusion. No high-grade or correctable stenosis identified. Patient did receive TPA. MRI showed patchy multifocal acute ischemic infarct involving the left cerebral hemisphere. Associated mild scattered petechial hemorrhage without frank hemorrhagic transformation. No  significant regional mass-effect.  Patient transferred to CIR on 03/23/2020 .    Patient currently requires {QSX:2820813} with {GIT:1959747} secondary to {impairments:3041632}.  Prior to hospitalization, patient could complete *** with {VEZ:5015868}.  Patient will benefit from skilled intervention to {benefit of skilled intervention:3041641} prior to discharge {discharge:3041642}.  Anticipate patient will require {supervision/assistance:22779} and {follow YB:7493552}.      OT Evaluation Precautions/Restrictions    General   Vital Signs Therapy Vitals Temp: 97.8 F (36.6 C) Temp Source: Oral Pulse Rate: 67 Resp: 16 BP: (!) 148/93 Patient Position (if appropriate): Lying Oxygen Therapy SpO2: 96 % O2 Device: Room Air Pain   Home Living/Prior Functioning Home Living Family/patient expects to be discharged to:: Private residence Living Arrangements: Spouse/significant other Vision   Perception    Praxis   Cognition   Sensation   Motor     Trunk/Postural Assessment     Balance   Extremity/Trunk Assessment      Care Tool Care Tool Self Care Eating        Oral Care         Bathing              Upper Body Dressing(including orthotics)            Lower Body Dressing (excluding footwear)          Putting on/Taking off footwear             Care Tool Toileting Toileting activity         Care Tool Bed Mobility Roll left and right activity        Sit to lying activity        Lying to sitting edge of bed activity  Care Tool Transfers Sit to stand transfer        Chair/bed transfer         Toilet transfer         Care Tool Cognition Expression of Ideas and Wants Expression of Ideas and Wants: Without difficulty (complex and basic) - expresses complex messages without difficulty and with speech that is clear and easy to understand   Understanding Verbal and Non-Verbal Content Understanding Verbal and Non-Verbal Content:  Understands (complex and basic) - clear comprehension without cues or repetitions   Memory/Recall Ability *first 3 days only      Refer to Care Plan for Long Term Goals  SHORT TERM GOAL WEEK 1    Recommendations for other services: {RECOMMENDATIONS FOR OTHER SERVICES:3049016}   Skilled Therapeutic Intervention ADL   Mobility      Discharge Criteria: Patient will be discharged from OT if patient refuses treatment 3 consecutive times without medical reason, if treatment goals not met, if there is a change in medical status, if patient makes no progress towards goals or if patient is discharged from hospital.  The above assessment, treatment plan, treatment alternatives and goals were discussed and mutually agreed upon: {Assessment/Treatment Plan Discussed/Agreed:3049017}  Valma Cava 03/23/2020, 9:33 PM

## 2020-03-23 NOTE — H&P (Signed)
Physical Medicine and Rehabilitation Admission H&P    Chief Complaint  Patient presents with  . Stroke with functional deficits    HPI: Cody Henson is a 45 year old right-handed male with history of depression maintained on Adderall as well as Zoloft. History from chart review due to aphasia. Patient lives with spouse and 70-year-old child. Wife is expecting in June. Two-level home 2 steps to entry.  Independent prior to admission.  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  He presented on 03/16/2018 with right hemiparesis, facial droop and aphasia. Admission chemistries unremarkable except potassium 3.4, glucose 116, WBCs 15,100, SARS coronavirus negative, urine drug screen positive marijuana. Cranial CT scan showed subtle evolving hypodensity involving the supra ganglionic mid posterior left frontal lobe concerning for acute left MCA distribution infarct. No hemorrhage noted.  CT angiogram head and neck negative for large vessel occlusion.  No high-grade or correctable stenosis identified.  Patient did receive TPA.  MRI showed patchy multifocal acute ischemic infarct involving the left cerebral hemisphere.  Associated mild scattered petechial hemorrhage without frank hemorrhagic transformation.  No significant regional mass-effect.   Echocardiogram ejection fraction of 60-65%. No wall motion abnormalities.  Bilateral lower extremity Dopplers negative.  TEE positive for PFO with bubbles crossing the atrial septum within 1-2 heartbeats.  No current plan for PFO closure follow-up per cardiology services.  Dr. Leonie Man recommends aspirin 81 mg daily and Plavix 75 mg daily for 3 weeks followed by  aspirin alone.  Subcutaneous Lovenox for DVT prophylaxis. Hospital course was complicated by bouts of urinary retention requiring I/O caths and has started on low dose Urecholine. Coagulopathy panel negative so far. He is tolerating a regular consistency diet.  Due to patient's right hemiparesis,  dysarthria, aphasia he was admitted for a comprehensive rehab program. Please see preadmission assessment from earlier today as well.   Review of Systems  Constitutional: Positive for malaise/fatigue. Negative for chills and fever.  HENT: Negative for hearing loss.   Eyes: Negative for blurred vision and double vision.  Respiratory: Negative for cough and shortness of breath.   Cardiovascular: Negative for chest pain, palpitations and leg swelling.  Gastrointestinal: Positive for constipation. Negative for heartburn, nausea and vomiting.  Genitourinary: Negative for dysuria, flank pain and hematuria.  Musculoskeletal: Positive for myalgias.  Skin: Negative for rash.  Neurological: Positive for speech change, focal weakness and weakness. Negative for dizziness and headaches.  Psychiatric/Behavioral: Positive for depression. The patient is not nervous/anxious.   All other systems reviewed and are negative.   Past Medical History:  Diagnosis Date  . Depressed     Past Surgical History:  Procedure Laterality Date  . BUBBLE STUDY  03/21/2020   Procedure: BUBBLE STUDY;  Surgeon: [name], MD;  Location: [facility];  Service: Cardiology;;  . TEE WITHOUT CARDIOVERSION N/A 03/21/2020   Procedure: TRANSESOPHAGEAL ECHOCARDIOGRAM (TEE);  Surgeon: [name], MD;  Location: [facility];  Service: Cardiology;  Laterality: N/A;    Family History  Problem Relation Age of Onset  . Stroke Paternal Grandfather   . Stroke Maternal Aunt     Social History: [name] is pregnant, expecting in June. Used to worked as a Forensic psychologist at [employer].  He does not smoke or use smokeless tobacco.  He drinks couple of beers/week.     Allergies  Allergen Reactions  . Albumen, Egg Itching  . Other Hives    Chicken meat, [place]    Medications Prior to Admission  Medication  Sig Dispense Refill  . acetaminophen (TYLENOL) 325 MG tablet Take 650 mg by mouth every 6 (six) hours as  needed for mild pain or headache.    . amphetamine-dextroamphetamine (ADDERALL) 10 MG tablet Take 10 mg by mouth 3 (three) times daily.    . Multiple Vitamin (MULTIVITAMIN) tablet Take 1 tablet by mouth daily.    . sertraline (ZOLOFT) 50 MG tablet Take 50 mg by mouth daily.      Drug Regimen Review Drug regimen was reviewed and remains appropriate with no significant issues identified  Home: Home Living Family/patient expects to be discharged to:: Private residence Living Arrangements: Spouse/significant other Available Help at Discharge: Family,Available 24 hours/day Type of Home: House Home Access: Stairs to enter [employer] of Steps: 10 Entrance Stairs-Rails: Right,Left Home Layout: Two level Alternate Level Stairs-Number of Steps: flight Bathroom Shower/Tub: Multimedia programmer: Standard Home Equipment: None  Lives With: Spouse   Functional History: Prior Function Level of Independence: Independent Comments: working, Arts administrator with PhD who works doing Scientist, research (life sciences) Status:  Mobility: Bed Mobility Overal bed mobility: Needs Assistance Bed Mobility: Supine to [employer]: Supervision Supine to sit: Supervision Sit to supine: Mod assist,+2 for safety/equipment (assist to manage RUE (to prevent flopping around) and RLE back into bed) General bed mobility comments: Pt rolled 1x each direction and transitioned supine > sit R EOB with bed flat and supervision for safety, extra time for all and assistance with covers management. Transfers Overall transfer level: Needs assistance Equipment used: Quad cane Transfers: Sit to/from Stand Sit to Stand: Min assist Stand pivot transfers: Mod assist General transfer comment: Cued pt to review "stop sign" pose of R elbow extension with writst extension several times prior to sit to stand to prepare for push-up movement of arms on bed. Cued pt to match R foot placement to L, needing extra  time and minA for steadying to come to stand. Ambulation/Gait Ambulation/Gait assistance: Min assist Gait Distance (Feet): 200 Feet Assistive device: Quad cane Gait Pattern/deviations: Step-to pattern,Ataxic,Step-through pattern,Decreased step length - left,Decreased stance time - right,Decreased dorsiflexion - right,Decreased weight shift to right General Gait Details: Pt ambulates with quad-cane in L hand, cuing pt initially for 3-point gait pattern with improved success and ease with increased distance. Pt displays step-to pattern with decreased R stance and weight shift thus decreased L step length, cued to improve with mod success. Cued pt to perform knee and hip flexion with ankle dorsiflexion during swing for improved foot clearance and sequence heel-to-toe during stance, mod success. Cued pt to relax R arm by side, needing to hold PT's hand with assistance to keep it there. Difficulty combining multiple steps at once. Increased knee instability and trunk sway with fatigue. Gait velocity: reduced Gait velocity interpretation: <1.8 ft/sec, indicate of risk for recurrent falls    ADL: ADL Overall ADL's : Needs assistance/impaired Eating/Feeding: Moderate assistance Eating/Feeding Details (indicate cue type and reason): Unable to use RUE functionally during self-feeding. Uses non-dominant LUE. patient unaware of food on R side of face 2/2 decreased sensation. Grooming: Wash/dry hands,Wash/dry face,Moderate assistance,Sitting Grooming Details (indicate cue type and reason): increased time, mod A for assist with RUE and cues how to use LUE to assist RUE Upper Body Bathing: Moderate assistance,Sitting Lower Body Bathing: Moderate assistance,Sit to/from stand Lower Body Bathing Details (indicate cue type and reason): can perform figure 4 on L but not R Upper Body Dressing : Moderate assistance,Sitting Lower Body Dressing: Moderate assistance Lower Body Dressing Details (indicate  cue type and  reason): con don sock on L foot but not right, did attempt to engage RUE with cues Toilet Transfer: Moderate assistance,+2 for physical assistance,+2 for safety/equipment Toilet Transfer Details (indicate cue type and reason): 2 person HHA. blocking for LLE and support for RUE Toileting- Clothing Manipulation and Hygiene: Maximal assistance Functional mobility during ADLs: Moderate assistance,+2 for physical assistance,+2 for safety/equipment (2 person HHA)  Cognition: Cognition Overall Cognitive Status: Impaired/Different from baseline Arousal/Alertness: Awake/alert Orientation Level: Oriented to person,Oriented to place,Oriented to situation Attention: Sustained Sustained Attention: Appears intact Memory:  (to be assessed) Awareness: Appears intact (for his verbal errors) Problem Solving: Appears intact Safety/Judgment: Appears intact (?? will continue to assess) Cognition Arousal/Alertness: Awake/alert Behavior During Therapy: WFL for tasks assessed/performed Overall Cognitive Status: Impaired/Different from baseline Area of Impairment: Attention,Memory,Following commands,Safety/judgement,Awareness,Problem solving Current Attention Level: Sustained Memory: Decreased short-term memory Following Commands: Follows one step commands consistently,Follows one step commands with increased time,Follows multi-step commands with increased time,Follows multi-step commands inconsistently Safety/Judgement: Decreased awareness of safety,Decreased awareness of deficits Awareness: Emergent Problem Solving: Slow processing,Requires verbal cues,Requires tactile cues,Difficulty sequencing General Comments: pt needs redirection to R sided deficits at times. Has difficulty processing more than one instruction at a time or instructions while mobilizing   Blood pressure (!) 149/101, pulse 71, temperature 97.9 F (36.6 C), temperature source Oral, resp. rate 15, height 5\' 8"  (1.727 m), weight 75.1 kg,  SpO2 97 %. Physical Exam Vitals reviewed.  HENT:     Head: Normocephalic and atraumatic.     Right Ear: External ear normal.     Left Ear: External ear normal.     Nose: Nose normal.  Eyes:     General:        Right eye: No discharge.        Left eye: No discharge.     Extraocular Movements: Extraocular movements intact.  Cardiovascular:     Rate and Rhythm: Normal rate and regular rhythm.  Pulmonary:     Effort: Pulmonary effort is normal. No respiratory distress.     Breath sounds: No stridor.  Abdominal:     General: Abdomen is flat. Bowel sounds are normal. There is no distension.  Musculoskeletal:     Cervical back: Normal range of motion and neck supple.     Comments: No edema or tenderness in extremities  Skin:    General: Skin is warm and dry.  Neurological:     Mental Status: He is alert.     Comments: Alert Makes eye contact with examiner.   Expressive>>receptive aphasia. Motor: LUE/LE: 5/5 proximal distal RUE: 4/5 proximal distal to proximal RLE: Hip flexion, knee extension 4/5, ankle dorsiflexion 3/5 with apraxia Dysarthria  Psychiatric:        Mood and Affect: Mood normal.        Behavior: Behavior normal.     Results for orders placed or performed during the hospital encounter of 03/16/20 (from the past 48 hour(s))  CBC     Status: None   Collection Time: 03/22/20  3:10 AM  Result Value Ref Range   WBC 9.1 4.0 - 10.5 K/uL   RBC 4.80 4.22 - 5.81 MIL/uL   Hemoglobin 14.0 13.0 - 17.0 g/dL   HCT 41.8 39.0 - 52.0 %   MCV 87.1 80.0 - 100.0 fL   MCH 29.2 26.0 - 34.0 pg   MCHC 33.5 30.0 - 36.0 g/dL   RDW 12.2 11.5 - 15.5 %   Platelets 284 150 - 400 K/uL  nRBC 0.0 0.0 - 0.2 %    Comment: Performed at [facility], [place] [address] [place], [place] [address]  Basic metabolic panel     Status: None   Collection Time: 03/22/20  3:10 AM  Result Value Ref Range   Sodium 138 135 - 145 mmol/L   Potassium 3.9 3.5 - 5.1 mmol/L   Chloride 103 98 - 111  mmol/L   CO2 24 22 - 32 mmol/L   Glucose, Bld 92 70 - 99 mg/dL    Comment: Glucose reference range applies only to samples taken after fasting for at least 8 hours.   BUN 12 6 - 20 mg/dL   Creatinine, Ser 1.02 0.61 - 1.24 mg/dL   Calcium 9.0 8.9 - 10.3 mg/dL   GFR, Estimated >60 >60 mL/min    Comment: (NOTE) Calculated using the CKD-EPI Creatinine Equation (2021)    Anion gap 11 5 - 15    Comment: Performed at [facility] [address] [place], [place] [address]   No results found.     Medical Problem List and Plan: 1.  Right hemiparesis with aphasia secondary to acute ischemic infarct left MCA distribution.  Status post TPA  -patient may shower  -ELOS/Goals: 8-12 days/supervision  Admit to CIR 2.  Antithrombotics: -DVT/anticoagulation: Lovenox  -antiplatelet therapy: Aspirin 81 mg daily and Plavix 75 mg daily x3 weeks and aspirin alone 3. Pain Management: Tylenol as needed 4. Mood: Zoloft 50 mg daily.  Provide emotional support  -antipsychotic agents: N/A 5. Neuropsych: This patient is not fully capable of making decisions on his own behalf. 6. Skin/Wound Care: Routine skin checks 7. Fluids/Electrolytes/Nutrition: Monitor I/O.   CMP ordered for tomorrow 8. PFO identified on TEE.  Follow-up cardiology services outpatient 9. Hypertension.  No antihypertensive medications prior to admission.    Monitor with increased mobility. 10. Urinary retention: Will d/c foley in am and start voiding program.   Continue low dose urecholine.  urecholine 11. Dyslipidemia: Now on Lipitor 12. Depression: On Zoloft   Elizabeth Sauer 03/23/2020  I have personally performed a face to face diagnostic evaluation, including, but not limited to relevant history and physical exam findings, of this patient and developed relevant assessment and plan.  Additionally, I have reviewed and concur with the physician assistant's documentation above.  Delice Lesch, MD, ABPMR

## 2020-03-23 NOTE — Progress Notes (Signed)
OT Cancellation Note  Patient Details Name: Cody Henson MRN: [id number] DOB: March 31, 1975   Cancelled Treatment:    Reason Eval/Treat Not Completed: Other (comment) patient with another discipline at this time. OT to check back as time allows.   Gloris Manchester OTR/L Supplemental OT, Department of rehab services [phone]  Lynnelle Mesmer R H. 03/23/2020, 12:04 PM

## 2020-03-23 NOTE — Progress Notes (Signed)
Inpatient Rehab Admissions Coordinator:     I checked Pt.'s insurance earlier today and he is not yet listed on his wife's policy. Pt. And wife are amenable to admitting self-pay; however, I do not have a bed for this Pt. Today. Will follow for potential admission pending bed availability.   Clemens Catholic, [place], [place] Admissions Coordinator  [phone] ([place]) [phone] (office)

## 2020-03-23 NOTE — Progress Notes (Signed)
  Speech Language Pathology Treatment: Cognitive-Linquistic  Patient Details Name: Cody Henson MRN: [id number] DOB: 1975/04/29 Today's Date: 03/23/2020 Time: 1124-1209 SLP Time Calculation (min) (ACUTE ONLY): 45 min  Assessment / Plan / Recommendation Clinical Impression  Session focused on consonant-vowel-consonant sequencing with repetition, vowel differentiation, and phonemic placement the focus.  Pt with excellent recognition of errors and spontaneous attempts to self-correct, responding well to auditory modeling.  For initial reading of three-letter words, he achieved 50% accuracy on first attempt; 50% of productions required modeling of vowel or visualization of phonemic placement. Writing with LUE revealed orthographic errors that mirrored verbal production, with frequent letter substitutions and good recognition of errors.  Pt's length of utterance has increased, and he demonstrates improved persistence through speech task.  He asked very appropriate questions about prognosis.  We discussed prognosis, focus of recovery, and encouragement was provided.  Pt given a letter board so that he may cue himself for initial phonemes of words. SLP will continue to work towards goals.   HPI HPI: Cody Henson is an 45 y.o. male presenting with acute onset of dense expressive and receptive aphasia with right sided weakness. MRI . Patchy multifocal acute ischemic infarcts involving the left cerebral hemisphere.  Associated mild scattered petechial hemorrhage without frank hemorrhagic transformation. No significant PMH. TEE positive for PFO.      SLP Plan  Continue with current plan of care       Recommendations   CIR                Follow up Recommendations: Inpatient Rehab Plan: Continue with current plan of care       GO                Juan Quam Laurice 03/23/2020, 12:15 PM  Shauni Henner L. Tivis Ringer, [place] Office number [phone] Pager  [phone]

## 2020-03-23 NOTE — Progress Notes (Addendum)
Inpatient Rehabilitation Medication Review by a Pharmacist  A complete drug regimen review was completed for this patient to identify any potential clinically significant medication issues.  Clinically significant medication issues were identified:  yes   Type of Medication Issue Identified Description of Issue Urgent (address now) Non-Urgent (address on AM team rounds) Plan Plan Accepted by Provider? (Yes / No / Pending AM Rounds)                                Additional Drug Therapy Needed  DC summary with MVI and Adderall but not on transfer Non-urgent Message PA Do not resume  Other         Name of provider notified for urgent issues identified: Algis Liming, PA  Provider Method of Notification: Secure chat   For non-urgent medication issues to be resolved on team rounds tomorrow morning a [facility] Secure Chat Handoff was sent to:    Pharmacist comments:   Time spent performing this drug regimen review (minutes):  5   Ward 03/23/2020 5:23 PM

## 2020-03-23 NOTE — Progress Notes (Signed)
Physical Therapy Treatment Patient Details Name: Cody Henson MRN: [id number] DOB: 03-10-1975 Today's Date: 03/23/2020    History of Present Illness Cody Henson is an 45 y.o. male presenting with acute onset of dense expressive and receptive aphasia with right sided weakness. MRI . Patchy multifocal acute ischemic infarcts involving the left cerebral hemisphere.  Associated mild scattered petechial hemorrhage without frank hemorrhagic transformation. No significant PMH.    PT Comments    Pt demonstrates progress towards his goals, ambulating an increased distance of up to ~200 ft in one bout with a quad-cane in his L hand and minA. His R sided flexion synergy impacts his gait pattern and stability/safety with mobility. He has difficulty isolating his joints and benefited from prepping with neuromuscular training prior to gait to perform the combined movements needed with different phases of gait or with proper positioning/movements with transfers, see Exercises below. Pt continues to display improved correction with cues, but poor memory and difficulty following multiple steps simultaneously. Cued pt to practice R wrist radial and ulnar deviation as this remains limited and difficult for him. Pt is at risk for falls and injury due to his imbalance, coordination, and strength deficits. Pt would greatly benefit from intensive therapies in the CIR setting as he is motivated, functioning significantly below baseline, and young in age. Will continue to follow acutely.  Follow Up Recommendations  CIR     Equipment Recommendations  Wheelchair (measurements PT);Wheelchair cushion (measurements PT) (quad cane)    Recommendations for Other Services Rehab consult     Precautions / Restrictions Precautions Precautions: Fall Restrictions Weight Bearing Restrictions: No    Mobility  Bed Mobility Overal bed mobility: Needs Assistance Bed Mobility: Supine to Sit;Rolling Rolling: Supervision   Supine to  sit: Supervision     General bed mobility comments: Pt rolled 1x each direction and transitioned supine > sit R EOB with bed flat and supervision for safety, extra time for all and assistance with covers management.  Transfers Overall transfer level: Needs assistance Equipment used: Quad cane Transfers: Sit to/from Stand Sit to Stand: Min assist         General transfer comment: Cued pt to review "stop sign" pose of R elbow extension with writst extension several times prior to sit to stand to prepare for push-up movement of arms on bed. Cued pt to match R foot placement to L, needing extra time and minA for steadying to come to stand.  Ambulation/Gait Ambulation/Gait assistance: Min assist Gait Distance (Feet): 200 Feet Assistive device: Quad cane Gait Pattern/deviations: Step-to pattern;Ataxic;Step-through pattern;Decreased step length - left;Decreased stance time - right;Decreased dorsiflexion - right;Decreased weight shift to right Gait velocity: reduced Gait velocity interpretation: <1.8 ft/sec, indicate of risk for recurrent falls General Gait Details: Pt ambulates with quad-cane in L hand, cuing pt initially for 3-point gait pattern with improved success and ease with increased distance. Pt displays step-to pattern with decreased R stance and weight shift thus decreased L step length, cued to improve with mod success. Cued pt to perform knee and hip flexion with ankle dorsiflexion during swing for improved foot clearance and sequence heel-to-toe during stance, mod success. Cued pt to relax R arm by side, needing to hold PT's hand with assistance to keep it there. Difficulty combining multiple steps at once. Increased knee instability and trunk sway with fatigue.   Stairs             Wheelchair Mobility    Modified Rankin (Stroke Patients Only) Modified Rankin (Stroke  Patients Only) Pre-Morbid Rankin Score: No symptoms Modified Rankin: Moderately severe disability      Balance Overall balance assessment: Needs assistance Sitting-balance support: No upper extremity supported;Feet unsupported Sitting balance-Leahy Scale: Good Sitting balance - Comments: Sitting EOB no LOB, supervision for safety.   Standing balance support: Single extremity supported Standing balance-Leahy Scale: Poor Standing balance comment: dependent on AD as well as external support                            Cognition Arousal/Alertness: Awake/alert Behavior During Therapy: WFL for tasks assessed/performed Overall Cognitive Status: Impaired/Different from baseline Area of Impairment: Attention;Memory;Following commands;Safety/judgement;Awareness;Problem solving                   Current Attention Level: Sustained Memory: Decreased short-term memory Following Commands: Follows one step commands consistently;Follows one step commands with increased time;Follows multi-step commands with increased time;Follows multi-step commands inconsistently Safety/Judgement: Decreased awareness of safety;Decreased awareness of deficits Awareness: Emergent Problem Solving: Slow processing;Requires verbal cues;Requires tactile cues;Difficulty sequencing General Comments: pt needs redirection to R sided deficits at times. Has difficulty processing more than one instruction at a time or instructions while mobilizing      Exercises Other Exercises Other Exercises: L sidelying position, cuing pt to flex R hip and knee and combine with ankle planarflexion ~8x and dorsiflexion ~8x Other Exercises: L sidelying positiuon, cuing pt to extend R hip and knee and plantarflex ankle x5 Other Exercises: L sidelying, cuing pt to extend R elbow and wrist ~8x Other Exercises: L sidelying, cuing pt to extend R elbow and flex R wrist Other Exercises: R wrist AAROM ulnar and radial deviation, x~10    General Comments        Pertinent Vitals/Pain Pain Assessment: No/denies pain Pain  Intervention(s): Monitored during session    Home Living                      Prior Function            PT Goals (current goals can now be found in the care plan section) Acute Rehab PT Goals Patient Stated Goal: be independent again PT Goal Formulation: With patient Time For Goal Achievement: 03/31/20 Potential to Achieve Goals: Good Progress towards PT goals: Progressing toward goals    Frequency    Min 4X/week      PT Plan Current plan remains appropriate    Co-evaluation              AM-PAC PT "6 Clicks" Mobility   Outcome Measure  Help needed turning from your back to your side while in a flat bed without using bedrails?: A Little Help needed moving from lying on your back to sitting on the side of a flat bed without using bedrails?: A Little Help needed moving to and from a bed to a chair (including a wheelchair)?: A Little Help needed standing up from a chair using your arms (e.g., wheelchair or bedside chair)?: A Little Help needed to walk in hospital room?: A Little Help needed climbing 3-5 steps with a railing? : Total 6 Click Score: 16    End of Session Equipment Utilized During Treatment: Gait belt Activity Tolerance: Patient tolerated treatment well Patient left: in chair;with call bell/phone within reach;with chair alarm set Nurse Communication: Mobility status;Other (comment) (bleeding noted from IV site, drying) PT Visit Diagnosis: Apraxia (R48.2);Other symptoms and signs involving the nervous system (R29.898);Hemiplegia and hemiparesis;Unsteadiness on  feet (R26.81);Other abnormalities of gait and mobility (R26.89);Muscle weakness (generalized) (M62.81);Difficulty in walking, not elsewhere classified (R26.2) Hemiplegia - Right/Left: Right Hemiplegia - dominant/non-dominant: Dominant Hemiplegia - caused by: Cerebral infarction     Time: 0832-0905 PT Time Calculation (min) (ACUTE ONLY): 33 min  Charges:  $Gait Training: 8-22  mins $Neuromuscular Re-education: 8-22 mins                     Moishe Spice, PT, DPT Acute Rehabilitation Services  Pager: [phone] Office: [place] 03/23/2020, 9:19 AM

## 2020-03-23 NOTE — H&P (Signed)
Physical Medicine and Rehabilitation Admission H&P    Chief Complaint  Patient presents with  . Stroke with functional deficits    HPI: Cody Henson is a 45 year old right-handed male with history of depression maintained on Adderall as well as Zoloft. History from chart review due to aphasia. Patient lives with spouse and 51-year-old child. Wife is expecting in June. Two-level home 2 steps to entry.  Independent prior to admission.  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  He presented on 03/16/2018 with right hemiparesis, facial droop and aphasia. Admission chemistries unremarkable except potassium 3.4, glucose 116, WBCs 15,100, SARS coronavirus negative, urine drug screen positive marijuana. Cranial CT scan showed subtle evolving hypodensity involving the supra ganglionic mid posterior left frontal lobe concerning for acute left MCA distribution infarct. No hemorrhage noted.  CT angiogram head and neck negative for large vessel occlusion.  No high-grade or correctable stenosis identified.  Patient did receive TPA.  MRI showed patchy multifocal acute ischemic infarct involving the left cerebral hemisphere.  Associated mild scattered petechial hemorrhage without frank hemorrhagic transformation.  No significant regional mass-effect.   Echocardiogram ejection fraction of 60-65%. No wall motion abnormalities.  Bilateral lower extremity Dopplers negative.  TEE positive for PFO with bubbles crossing the atrial septum within 1-2 heartbeats.  No current plan for PFO closure follow-up per cardiology services.  Dr. Leonie Man recommends aspirin 81 mg daily and Plavix 75 mg daily for 3 weeks followed by  aspirin alone.  Subcutaneous Lovenox for DVT prophylaxis. Hospital course was complicated by bouts of urinary retention requiring I/O caths and has started on low dose Urecholine. Coagulopathy panel negative so far. He is tolerating a regular consistency diet.  Due to patient's right hemiparesis,  dysarthria, aphasia he was admitted for a comprehensive rehab program. Please see preadmission assessment from earlier today as well.   Review of Systems  Constitutional: Positive for malaise/fatigue. Negative for chills and fever.  HENT: Negative for hearing loss.   Eyes: Negative for blurred vision and double vision.  Respiratory: Negative for cough and shortness of breath.   Cardiovascular: Negative for chest pain, palpitations and leg swelling.  Gastrointestinal: Positive for constipation. Negative for heartburn, nausea and vomiting.  Genitourinary: Negative for dysuria, flank pain and hematuria.  Musculoskeletal: Positive for myalgias.  Skin: Negative for rash.  Neurological: Positive for speech change, focal weakness and weakness. Negative for dizziness and headaches.  Psychiatric/Behavioral: Positive for depression. The patient is not nervous/anxious.   All other systems reviewed and are negative.   Past Medical History:  Diagnosis Date  . Depressed     Past Surgical History:  Procedure Laterality Date  . BUBBLE STUDY  03/21/2020   Procedure: BUBBLE STUDY;  Surgeon: [name], MD;  Location: [facility];  Service: Cardiology;;  . TEE WITHOUT CARDIOVERSION N/A 03/21/2020   Procedure: TRANSESOPHAGEAL ECHOCARDIOGRAM (TEE);  Surgeon: [name], MD;  Location: [facility];  Service: Cardiology;  Laterality: N/A;    Family History  Problem Relation Age of Onset  . Stroke Paternal Grandfather   . Stroke Maternal Aunt     Social History: Cody Henson is pregnant, expecting in June. Used to worked as a Forensic psychologist at [employer].  He does not smoke or use smokeless tobacco.  He drinks couple of beers/week.     Allergies  Allergen Reactions  . Albumen, Egg Itching  . Other Hives    Chicken meat, [place]    Medications Prior to Admission  Medication  Sig Dispense Refill  . acetaminophen (TYLENOL) 325 MG tablet Take 650 mg by mouth every 6 (six) hours as  needed for mild pain or headache.    . amphetamine-dextroamphetamine (ADDERALL) 10 MG tablet Take 10 mg by mouth 3 (three) times daily.    . Multiple Vitamin (MULTIVITAMIN) tablet Take 1 tablet by mouth daily.    . sertraline (ZOLOFT) 50 MG tablet Take 50 mg by mouth daily.      Drug Regimen Review Drug regimen was reviewed and remains appropriate with no significant issues identified  Home: Home Living Family/patient expects to be discharged to:: Private residence Living Arrangements: Spouse/significant other Available Help at Discharge: Family,Available 24 hours/day Type of Home: House Home Access: Stairs to enter [employer] of Steps: 10 Entrance Stairs-Rails: Right,Left Home Layout: Two level Alternate Level Stairs-Number of Steps: flight Bathroom Shower/Tub: Multimedia programmer: Standard Home Equipment: None  Lives With: Spouse   Functional History: Prior Function Level of Independence: Independent Comments: working, 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 Transfers Overall transfer level: Needs assistance Equipment used: Quad cane Transfers: Sit to/from Stand Sit to Stand: Min assist Stand pivot transfers: Mod assist General transfer comment: Cued pt to review "stop sign" pose of R elbow extension with writst extension several times prior to sit to stand to prepare for push-up movement of arms on bed. Cued pt to match R foot placement to L, needing extra  time and minA for steadying to come to stand. Ambulation/Gait Ambulation/Gait assistance: Min assist Gait Distance (Feet): 200 Feet Assistive device: Quad cane Gait Pattern/deviations: Step-to pattern,Ataxic,Step-through pattern,Decreased step length - left,Decreased stance time - right,Decreased dorsiflexion - right,Decreased weight shift to right General Gait Details: Pt ambulates with quad-cane in L hand, cuing pt initially for 3-point gait pattern with improved success and ease with increased distance. Pt displays step-to pattern with decreased R stance and weight shift thus decreased L step length, cued to improve with mod success. Cued pt to perform knee and hip flexion with ankle dorsiflexion during swing for improved foot clearance and sequence heel-to-toe during stance, mod success. Cued pt to relax R arm by side, needing to hold PT's hand with assistance to keep it there. Difficulty combining multiple steps at once. Increased knee instability and trunk sway with fatigue. Gait velocity: reduced Gait velocity interpretation: <1.8 ft/sec, indicate of risk for recurrent falls    ADL: ADL Overall ADL's : Needs assistance/impaired Eating/Feeding: Moderate assistance Eating/Feeding Details (indicate cue type and reason): Unable to use Henson functionally during self-feeding. Uses non-dominant LUE. patient unaware of food on R side of face 2/2 decreased sensation. Grooming: Wash/dry hands,Wash/dry face,Moderate assistance,Sitting Grooming Details (indicate cue type and reason): increased time, mod A for assist with Henson and cues how to use LUE to assist Henson Upper Body Bathing: Moderate assistance,Sitting Lower Body Bathing: Moderate assistance,Sit to/from stand Lower Body Bathing Details (indicate cue type and reason): can perform figure 4 on L but not R Upper Body Dressing : Moderate assistance,Sitting Lower Body Dressing: Moderate assistance Lower Body Dressing Details (indicate  cue type and  reason): con don sock on L foot but not right, did attempt to engage Henson with cues Toilet Transfer: Moderate assistance,+2 for physical assistance,+2 for safety/equipment Toilet Transfer Details (indicate cue type and reason): 2 person HHA. blocking for LLE and support for Henson Toileting- Clothing Manipulation and Hygiene: Maximal assistance Functional mobility during ADLs: Moderate assistance,+2 for physical assistance,+2 for safety/equipment (2 person HHA)  Cognition: Cognition Overall Cognitive Status: Impaired/Different from baseline Arousal/Alertness: Awake/alert Orientation Level: Oriented to person,Oriented to place,Oriented to situation Attention: Sustained Sustained Attention: Appears intact Memory:  (to be assessed) Awareness: Appears intact (for his verbal errors) Problem Solving: Appears intact Safety/Judgment: Appears intact (?? will continue to assess) Cognition Arousal/Alertness: Awake/alert Behavior During Therapy: WFL for tasks assessed/performed Overall Cognitive Status: Impaired/Different from baseline Area of Impairment: Attention,Memory,Following commands,Safety/judgement,Awareness,Problem solving Current Attention Level: Sustained Memory: Decreased short-term memory Following Commands: Follows one step commands consistently,Follows one step commands with increased time,Follows multi-step commands with increased time,Follows multi-step commands inconsistently Safety/Judgement: Decreased awareness of safety,Decreased awareness of deficits Awareness: Emergent Problem Solving: Slow processing,Requires verbal cues,Requires tactile cues,Difficulty sequencing General Comments: pt needs redirection to R sided deficits at times. Has difficulty processing more than one instruction at a time or instructions while mobilizing   Blood pressure (!) 149/101, pulse 71, temperature 97.9 F (36.6 C), temperature source Oral, resp. rate 15, height 5\' 8"  (1.727 m), weight 75.1 kg,  SpO2 97 %. Physical Exam Vitals reviewed.  HENT:     Head: Normocephalic and atraumatic.     Right Ear: External ear normal.     Left Ear: External ear normal.     Nose: Nose normal.  Eyes:     General:        Right eye: No discharge.        Left eye: No discharge.     Extraocular Movements: Extraocular movements intact.  Cardiovascular:     Rate and Rhythm: Normal rate and regular rhythm.  Pulmonary:     Effort: Pulmonary effort is normal. No respiratory distress.     Breath sounds: No stridor.  Abdominal:     General: Abdomen is flat. Bowel sounds are normal. There is no distension.  Musculoskeletal:     Cervical back: Normal range of motion and neck supple.     Comments: No edema or tenderness in extremities  Skin:    General: Skin is warm and dry.  Neurological:     Mental Status: He is alert.     Comments: Alert Makes eye contact with examiner.   Expressive>>receptive aphasia. Motor: LUE/LE: 5/5 proximal distal Henson: 4/5 proximal distal to proximal RLE: Hip flexion, knee extension 4/5, ankle dorsiflexion 3/5 with apraxia Dysarthria  Psychiatric:        Mood and Affect: Mood normal.        Behavior: Behavior normal.     Results for orders placed or performed during the hospital encounter of 03/16/20 (from the past 48 hour(s))  CBC     Status: None   Collection Time: 03/22/20  3:10 AM  Result Value Ref Range   WBC 9.1 4.0 - 10.5 K/uL   RBC 4.80 4.22 - 5.81 MIL/uL   Hemoglobin 14.0 13.0 - 17.0 g/dL   HCT 41.8 39.0 - 52.0 %   MCV 87.1 80.0 - 100.0 fL   MCH 29.2 26.0 - 34.0 pg   MCHC 33.5 30.0 - 36.0 g/dL   RDW 12.2 11.5 - 15.5 %   Platelets 284 150 - 400 K/uL  nRBC 0.0 0.0 - 0.2 %    Comment: Performed at [facility], [place] [address] [place], [place] [address]  Basic metabolic panel     Status: None   Collection Time: 03/22/20  3:10 AM  Result Value Ref Range   Sodium 138 135 - 145 mmol/L   Potassium 3.9 3.5 - 5.1 mmol/L   Chloride 103 98 - 111  mmol/L   CO2 24 22 - 32 mmol/L   Glucose, Bld 92 70 - 99 mg/dL    Comment: Glucose reference range applies only to samples taken after fasting for at least 8 hours.   BUN 12 6 - 20 mg/dL   Creatinine, Ser 1.02 0.61 - 1.24 mg/dL   Calcium 9.0 8.9 - 10.3 mg/dL   GFR, Estimated >60 >60 mL/min    Comment: (NOTE) Calculated using the CKD-EPI Creatinine Equation (2021)    Anion gap 11 5 - 15    Comment: Performed at [facility] [address], [place], [place] [address]   No results found.     Medical Problem List and Plan: 1.  Right hemiparesis with aphasia secondary to acute ischemic infarct left MCA distribution.  Status post TPA  -patient may shower  -ELOS/Goals: 8-12 days/supervision  Admit to CIR 2.  Antithrombotics: -DVT/anticoagulation: Lovenox  -antiplatelet therapy: Aspirin 81 mg daily and Plavix 75 mg daily x3 weeks and aspirin alone 3. Pain Management: Tylenol as needed 4. Mood: Zoloft 50 mg daily.  Provide emotional support  -antipsychotic agents: N/A 5. Neuropsych: This patient is not fully capable of making decisions on his own behalf. 6. Skin/Wound Care: Routine skin checks 7. Fluids/Electrolytes/Nutrition: Monitor I/O.   CMP ordered for tomorrow 8. PFO identified on TEE.  Follow-up cardiology services outpatient 9. Hypertension.  No antihypertensive medications prior to admission.    Monitor with increased mobility. 10. Urinary retention: Will d/c foley in am and start voiding program.   Continue low dose urecholine.  urecholine 11. Dyslipidemia: Now on Lipitor 12. Depression: On Zoloft   Elizabeth Sauer 03/23/2020  I have personally performed a face to face diagnostic evaluation, including, but not limited to relevant history and physical exam findings, of this patient and developed relevant assessment and plan.  Additionally, I have reviewed and concur with the physician assistant's documentation above.  Delice Lesch, MD, ABPMR  The patient's  status has not changed. Any changes from the pre-admission screening or documentation from the acute chart are noted above.   Delice Lesch, MD, ABPMR

## 2020-03-23 NOTE — Plan of Care (Signed)

## 2020-03-24 DIAGNOSIS — R4701 Aphasia: Secondary | ICD-10-CM | POA: Diagnosis not present

## 2020-03-24 DIAGNOSIS — I10 Essential (primary) hypertension: Secondary | ICD-10-CM

## 2020-03-24 DIAGNOSIS — R339 Retention of urine, unspecified: Secondary | ICD-10-CM | POA: Diagnosis not present

## 2020-03-24 DIAGNOSIS — I63512 Cerebral infarction due to unspecified occlusion or stenosis of left middle cerebral artery: Secondary | ICD-10-CM

## 2020-03-24 LAB — COMPREHENSIVE METABOLIC PANEL
ALT: 60 U/L — ABNORMAL HIGH (ref 0–44)
AST: 44 U/L — ABNORMAL HIGH (ref 15–41)
Albumin: 3.9 g/dL (ref 3.5–5.0)
Alkaline Phosphatase: 69 U/L (ref 38–126)
Anion gap: 10 (ref 5–15)
BUN: 16 mg/dL (ref 6–20)
CO2: 28 mmol/L (ref 22–32)
Calcium: 9.5 mg/dL (ref 8.9–10.3)
Chloride: 100 mmol/L (ref 98–111)
Creatinine, Ser: 1.14 mg/dL (ref 0.61–1.24)
GFR, Estimated: >60 mL/min (ref >60)
Glucose, Bld: 89 mg/dL (ref 70–99)
Potassium: 4 mmol/L (ref 3.5–5.1)
Sodium: 138 mmol/L (ref 135–145)
Total Bilirubin: 0.8 mg/dL (ref 0.3–1.2)
Total Protein: 6.8 g/dL (ref 6.5–8.1)

## 2020-03-24 LAB — CBC WITH DIFFERENTIAL/PLATELET
Abs Immature Granulocytes: 0.04 10*3/uL (ref 0.00–0.07)
Basophils Absolute: 0.1 10*3/uL (ref 0.0–0.1)
Basophils Relative: 1 %
Eosinophils Absolute: 0.4 10*3/uL (ref 0.0–0.5)
Eosinophils Relative: 5 %
HCT: 45.5 % (ref 39.0–52.0)
Hemoglobin: 15.2 g/dL (ref 13.0–17.0)
Immature Granulocytes: 1 %
Lymphocytes Relative: 32 %
Lymphs Abs: 2.6 10*3/uL (ref 0.7–4.0)
MCH: 29 pg (ref 26.0–34.0)
MCHC: 33.4 g/dL (ref 30.0–36.0)
MCV: 86.7 fL (ref 80.0–100.0)
Monocytes Absolute: 0.6 10*3/uL (ref 0.1–1.0)
Monocytes Relative: 7 %
Neutro Abs: 4.5 10*3/uL (ref 1.7–7.7)
Neutrophils Relative %: 54 %
Platelets: 296 10*3/uL (ref 150–400)
RBC: 5.25 MIL/uL (ref 4.22–5.81)
RDW: 12.1 % (ref 11.5–15.5)
WBC: 8.3 10*3/uL (ref 4.0–10.5)
nRBC: 0 % (ref 0.0–0.2)

## 2020-03-24 LAB — PROTHROMBIN GENE MUTATION

## 2020-03-24 MED ORDER — ENSURE ENLIVE PO LIQD
237.0000 mL | Freq: Two times a day (BID) | ORAL | Status: DC
  Administered 2020-03-24 – 2020-04-06 (×22): 237 mL via ORAL
  Filled 2020-03-24: qty 237

## 2020-03-24 MED ORDER — ADULT MULTIVITAMIN W/MINERALS CH
1.0000 | ORAL_TABLET | Freq: Every day | ORAL | Status: DC
  Administered 2020-03-24 – 2020-04-06 (×14): 1 via ORAL
  Filled 2020-03-24 (×14): qty 1

## 2020-03-24 NOTE — Progress Notes (Signed)
Inpatient Rehabilitation  Patient information reviewed and entered into eRehab system by Hydia Copelin M. Latisa Belay, M.A., CCC/SLP, PPS Coordinator.  Information including medical coding, functional ability and quality indicators will be reviewed and updated through discharge.    

## 2020-03-24 NOTE — Progress Notes (Addendum)
[facility] PHYSICAL MEDICINE & REHABILITATION PROGRESS NOTE   Subjective/Complaints: Pt in bed trying to use the urinal (unsuccessful so far). Says he had a reasonable night  ROS: limited due to language/communication   Objective:   No results found. Recent Labs    03/22/20 0310 03/24/20 0504  WBC 9.1 8.3  HGB 14.0 15.2  HCT 41.8 45.5  PLT 284 296   Recent Labs    03/22/20 0310 03/23/20 1852 03/24/20 0504  NA 138  --  138  K 3.9  --  4.0  CL 103  --  100  CO2 24  --  28  GLUCOSE 92  --  89  BUN 12  --  16  CREATININE 1.02 0.99 1.14  CALCIUM 9.0  --  9.5    Intake/Output Summary (Last 24 hours) at 03/24/2020 1006 Last data filed at 03/24/2020 0231 Gross per 24 hour  Intake --  Output 1900 ml  Net -1900 ml        Physical Exam: Vital Signs Blood pressure (!) 148/98, pulse 60, temperature 97.7 F (36.5 C), resp. rate 16, height 5\' 8"  (1.727 m), weight 71.8 kg, SpO2 98 %.  General: Alert and oriented x 3, No apparent distress HEENT: Head is normocephalic, atraumatic, PERRLA, EOMI, sclera anicteric, oral mucosa pink and moist, dentition intact, ext ear canals clear,  Neck: Supple without JVD or lymphadenopathy Heart: Reg rate and rhythm. No murmurs rubs or gallops Chest: CTA bilaterally without wheezes, rales, or rhonchi; no distress Abdomen: Soft, non-tender, non-distended, bowel sounds positive. Extremities: No clubbing, cyanosis, or edema. Pulses are 2+ Psych: Pt's affect is appropriate. Pt is cooperative Skin: Clean and intact without signs of breakdown Neuro: pt alert. Follows basic commands. Able to ID 3/3 simple objects in the room but has expressive language deficits especially for more complex information. RUE 4- /5 prox to distal. RLE 4/5 prox to distal. Apraxic. Sensation 1/2 right hemi-face as well as arm and leg to LT and pain. LUE 5/5 with normal sensation. DTR's 3+ RLE. Musculoskeletal: Full ROM, No pain with AROM or PROM in the neck, trunk, or  extremities. Posture appropriate     Assessment/Plan: 1. Functional deficits which require 3+ hours per day of interdisciplinary therapy in a comprehensive inpatient rehab setting.  Physiatrist is providing close team supervision and 24 hour management of active medical problems listed below.  Physiatrist and rehab team continue to assess barriers to discharge/monitor patient progress toward functional and medical goals  Care Tool:  Bathing              Bathing assist       Upper Body Dressing/Undressing Upper body dressing        Upper body assist      Lower Body Dressing/Undressing Lower body dressing            Lower body assist       Toileting Toileting    Toileting assist Assist for toileting: Independent     Transfers Chair/bed transfer  Transfers assist           Locomotion Ambulation   Ambulation assist              Walk 10 feet activity   Assist           Walk 50 feet activity   Assist           Walk 150 feet activity   Assist  Walk 10 feet on uneven surface  activity   Assist           Wheelchair     Assist               Wheelchair 50 feet with 2 turns activity    Assist            Wheelchair 150 feet activity     Assist          Blood pressure (!) 148/98, pulse 60, temperature 97.7 F (36.5 C), resp. rate 16, height 5\' 8"  (1.727 m), weight 71.8 kg, SpO2 98 %.  Medical Problem List and Plan: 1.  Right hemiparesis with aphasia secondary to acute ischemic infarct left MCA distribution.  Status post TPA             -patient may shower             -ELOS/Goals: 8-12 days/supervision            -beginning therapies today 2.  Antithrombotics: -DVT/anticoagulation: Lovenox             -antiplatelet therapy: Aspirin 81 mg daily and Plavix 75 mg daily x3 weeks and aspirin alone 3. Pain Management: Tylenol as needed 4. Mood: Zoloft 50 mg daily.  Provide  emotional support             -antipsychotic agents: N/A 5. Neuropsych: This patient is not fully capable of making decisions on his own behalf. 6. Skin/Wound Care: Routine skin checks 7. Fluids/Electrolytes/Nutrition: intake solid so far               -CMET reviewed and WNL 8. PFO identified on TEE.  Follow-up cardiology services outpatient 9. Hypertension.  No antihypertensive medications prior to admission.              2/3 BP esp DBP elevated recently---observe for consistent pattern before adding medication 10. Urinary retention: continue voiding trial  -OOB to toilet/commode to empty              Continue low dose urecholine.  urecholine 11. Dyslipidemia: Now on Lipitor 12. Depression: On Zoloft    LOS: 1 days A FACE TO FACE EVALUATION WAS PERFORMED  Meredith Staggers 03/24/2020, 10:06 AM

## 2020-03-24 NOTE — Progress Notes (Signed)
Initial Nutrition Assessment  RD working remotely.  DOCUMENTATION CODES:   Not applicable  INTERVENTION:   - Ensure Enlive po BID, each supplement provides 350 kcal and 20 grams of protein  - MVI with minerals daily  NUTRITION DIAGNOSIS:   Increased nutrient needs related to other (therapies) as evidenced by estimated needs.  GOAL:   Patient will meet greater than or equal to 90% of their needs  MONITOR:   PO intake,Supplement acceptance,Weight trends  REASON FOR ASSESSMENT:   Consult Assessment of nutrition requirement/status  ASSESSMENT:   45 year old male with PMH of depression. Pt presented on 03/16/20 with right hemiparesis, facial droop, and aphasia. Pt found to have acute ischemic infarct left MCA distribution and did receive TPA. Admitted to CIR on 03/23/20.   Spoke with pt briefly via phone call to room. Interview was limited secondary to aphasia. Will attempt to obtain additional diet and weight history at follow-up.  Pt reports good appetite and states that he ate "everything" at breakfast this morning. RD encouraged pt to continue to eat well at meals.  Pt amenable to receiving oral nutrition supplement to aid in meeting kcal and protein needs. RD to order. RD will also order daily MVI with minerals.  Reviewed weights in chart. No weight history available prior to acute admission. If weights are accurate, pt with a 3.3 kg weight loss since 03/21/20. This is a 4.4% weight loss in less than 1 week which is significant for timeframe. RD will continue to monitor trends.  Medications reviewed and include: protonix  Labs reviewed.  NUTRITION - FOCUSED PHYSICAL EXAM:  Unable to complete at this time. RD working remotely.  Diet Order:   Diet Order            Diet regular Room service appropriate? Yes; Fluid consistency: Thin  Diet effective now                 EDUCATION NEEDS:   Education needs have been addressed  Skin:  Skin Assessment: Reviewed RN  Assessment  Last BM:  03/21/20  Height:   Ht Readings from Last 1 Encounters:  03/23/20 5\' 8"  (1.727 m)    Weight:   Wt Readings from Last 1 Encounters:  03/23/20 71.8 kg    BMI:  Body mass index is 24.07 kg/m.  Estimated Nutritional Needs:   Kcal:  2200-2400  Protein:  100-115 grams  Fluid:  >/= 2.0 L    Gustavus Bryant, MS, RD, LDN Inpatient Clinical Dietitian Please see [facility] for contact information.

## 2020-03-24 NOTE — Evaluation (Signed)
Speech Language Pathology Assessment and Plan  Patient Details  Name: Cody Henson MRN: [id number] Date of Birth: 01-Jul-1975  SLP Diagnosis: Apraxia;Speech and Language deficits  Rehab Potential: Good ELOS: 2-3 weeks    Today's Date: 03/24/2020 SLP Individual Time: 1430-1530 SLP Individual Time Calculation (min): 60 min   Hospital Problem: Principal Problem:   Left middle cerebral artery stroke [facility])  Past Medical History:  Past Medical History:  Diagnosis Date  . Depressed    Past Surgical History:  Past Surgical History:  Procedure Laterality Date  . BUBBLE STUDY  03/21/2020   Procedure: BUBBLE STUDY;  Surgeon: [name], MD;  Location: [facility];  Service: Cardiology;;  . TEE WITHOUT CARDIOVERSION N/A 03/21/2020   Procedure: TRANSESOPHAGEAL ECHOCARDIOGRAM (TEE);  Surgeon: [name], MD;  Location: [facility];  Service: Cardiology;  Laterality: N/A;    Assessment / Plan / Recommendation  Cody Henson is a 45 year old right-handed male with history of depression maintained on Adderall as well as Zoloft. History from chart review due to aphasia. Patient lives with spouse and 14-year-old child. Wife is expecting in June. Two-level home 2 steps to entry. Independent prior to admission. 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 He presented on 03/16/2018 with right hemiparesis, facial droop and aphasia. Admission chemistries unremarkable except potassium 3.4, glucose 116, WBCs 15,100, SARS coronavirus negative, urine drug screen positive marijuana. Cranial CT scan showed subtle evolving hypodensity involving the supra ganglionic mid posterior left frontal lobe concerning for acute left MCA distribution infarct. No hemorrhage noted. CT angiogram head and neck negative for large vessel occlusion. No high-grade or correctable stenosis identified. Patient did receive TPA. MRI showed patchy multifocal acute ischemic infarct involving the left cerebral  hemisphere. Associated mild scattered petechial hemorrhage without frank hemorrhagic transformation. No significant regional mass-effect.   Echocardiogram ejection fraction of 60-65%. No wall motion abnormalities. Bilateral lower extremity Dopplers negative. TEE positive for PFO with bubbles crossing the atrial septum within 1-2 heartbeats. No current plan for PFO closure follow-up per cardiology services. Dr. Leonie Man recommends aspirin 81 mg daily and Plavix 75 mg daily for 3 weeks followed by aspirin alone. Subcutaneous Lovenox for DVT prophylaxis. Hospital course was complicated by bouts of urinary retention requiring I/O caths and has started on low dose Urecholine. Coagulopathy panel negative so far. He is tolerating a regular consistency diet. Due to patient's right hemiparesis, dysarthria, aphasia he was admitted for a comprehensive rehab program. Patient transferred to [facility] on 03/23/2020 .  Clinical Impression Patient demonstrates a primary oral motor apraxia of speech with secondary higher level language and potentially cognitive deficits. (SLP plans to continue to assess higher level cognition and language as patient's apraxia of speech improves. He demonstrates groping, discoordinated movements of articulators and is aware of this. When slowing down, he is able to improve his speech production to communicate at word and phrase levels. Multisyllabic words (more than two syllables) were significantly more difficult for him and discoordination was more noticeable. Patient very accurate with confrontational naming, responsive naming and inconsistent with divergent naming. He will benefit from skilled SLP intervention to improve his overall expressive communication.  Skilled Therapeutic Interventions          Speech-language cognitive evaluation  SLP Assessment  Patient will need skilled [facility] Services during CIR admission    Recommendations  Recommendations for Other  Services: Neuropsych consult Patient destination: Home Follow up Recommendations: Outpatient SLP Equipment Recommended: None recommended by SLP  SLP Frequency 3 to 5 out of 7 days   SLP Duration  SLP Intensity  SLP Treatment/Interventions 2-3 weeks  Minumum of 1-2 x/day, 30 to 90 minutes  Cueing hierarchy;Functional tasks;Patient/family education;Speech/Language facilitation    Pain Pain Assessment Pain Scale: 0-10 Pain Score: 0-No pain  Prior Functioning Cognitive/Linguistic Baseline: Within functional limits Type of Home: House  Lives With: Spouse Available Help at Discharge: Family;Available 24 hours/day Education: PhD, Arts administrator Vocation: Full time employment  SLP Evaluation Cognition Overall Cognitive Status: Impaired/Different from baseline Arousal/Alertness: Awake/alert Orientation Level: Oriented X4 Attention: Alternating Sustained Attention: Appears intact Alternating Attention: Impaired Alternating Attention Impairment: Verbal complex;Verbal basic Memory: Impaired Memory Impairment: Retrieval deficit Immediate Memory Recall: Sock;Blue;Bed Memory Recall Sock: Without Cue Memory Recall Blue: Without Cue Memory Recall Bed: Not able to recall Awareness: Appears intact Problem Solving: Appears intact Safety/Judgment: Appears intact  Comprehension Auditory Comprehension Overall Auditory Comprehension: Impaired Commands: Impaired Two Step Basic Commands: 25-49% accurate Multistep Basic Commands: Not tested Conversation: Simple Interfering Components: Processing speed;Motor planning EffectiveTechniques: Extra processing time;Repetition Visual Recognition/Discrimination Discrimination: Not tested Reading Comprehension Reading Status: Not tested Expression Expression Primary Mode of Expression: Verbal Verbal Expression Initiation: No impairment Level of Generative/Spontaneous Verbalization: Phrase Repetition: Impaired Level of Impairment:  Phrase level Naming: Impairment Responsive: 76-100% accurate Confrontation: Within functional limits Convergent: Not tested Divergent: 50-74% accurate Verbal Errors: Aware of errors Pragmatics: No impairment Effective Techniques: Open ended questions;Phonemic cues Non-Verbal Means of Communication: Not applicable Written Expression Dominant Hand: Right Written Expression: Not tested Oral Motor Oral Motor/Sensory Function Overall Oral Motor/Sensory Function: Mild impairment Facial ROM: Reduced right;Other (Comment) (patient 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) Motor Speech Overall Motor Speech: Impaired Respiration: Within functional limits Phonation: Normal Resonance: Within functional limits Articulation: Within functional limitis Intelligibility: Intelligible Motor Planning: Impaired Level of Impairment: Word Motor Speech Errors: Groping for words;Inconsistent;Aware Effective Techniques: Slow rate;Pacing  Care Tool Care Tool Cognition Expression of Ideas and Wants Expression of Ideas and Wants: Frequent difficulty - frequently exhibits difficulty with expressing needs and ideas   Understanding Verbal and Non-Verbal Content Understanding Verbal and Non-Verbal Content: Usually understands - understands most conversations, but misses some part/intent of message. Requires cues at times to understand   Memory/Recall Ability *first 3 days only Memory/Recall Ability *first 3 days only: Location of own room;That he or she is in a hospital/hospital unit;Current season      Short Term Goals: Week 1: SLP Short Term Goal 1 (Week 1): Patient will produce 3-4 syllable words/phrases with 80% accuracy and minA cues SLP Short Term Goal 2 (Week 1): Patient will describe object function at phrase level with 80% accuracy and modA cues. SLP Short Term Goal 3 (Week 1): Patient will perform oral reading at phrase level with 75% accuracy and mod A cues. SLP Short Term Goal 4 (Week 1): Patient  will follow 2 step directions/pointing with modA cues.  Refer to Care Plan for Long Term Goals  Recommendations for other services: Neuropsych  Discharge Criteria: Patient will be discharged from SLP if patient refuses treatment 3 consecutive times without medical reason, if treatment goals not met, if there is a change in medical status, if patient makes no progress towards goals or if patient is discharged from hospital.  The above assessment, treatment plan, treatment alternatives and goals were discussed and mutually agreed upon: by patient and by family  Sonia Baller, MA, CCC-SLP Speech Therapy

## 2020-03-24 NOTE — Evaluation (Signed)
Physical Therapy Assessment and Plan  Patient Details  Name: Cody Henson MRN: [id number] Date of Birth: 08-01-1975  PT Diagnosis: Coordination disorder, Difficulty walking, Hemiplegia dominant and Muscle weakness Rehab Potential: Good ELOS: 2.5-3 weeks   Today's Date: 03/24/2020 PT Individual Time: 3474-2595 and 1400-1430 PT Individual Time Calculation (min): 27 min  And 30 min  Hospital Problem: Principal Problem:   Left middle cerebral artery stroke [facility])   Past Medical History:  Past Medical History:  Diagnosis Date  . Depressed    Past Surgical History:  Past Surgical History:  Procedure Laterality Date  . BUBBLE STUDY  03/21/2020   Procedure: BUBBLE STUDY;  Surgeon: [name], MD;  Location: [place];  Service: Cardiology;;  . TEE WITHOUT CARDIOVERSION N/A 03/21/2020   Procedure: TRANSESOPHAGEAL ECHOCARDIOGRAM (TEE);  Surgeon: [name], MD;  Location: [place];  Service: Cardiology;  Laterality: N/A;    Assessment & Plan Clinical Impression: Patient is a 45 year old right-handed male with history of depression maintained on Adderall as well as Zoloft. History from chart review due to aphasia. Patient lives with spouse and 93-year-old child. Wife is expecting in June. Two-level home 2 steps to entry.  Independent prior to admission.  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  He presented on 03/16/2018 with right hemiparesis, facial droop and aphasia. Admission chemistries unremarkable except potassium 3.4, glucose 116, WBCs 15,100, SARS coronavirus negative, urine drug screen positive marijuana. Cranial CT scan showed subtle evolving hypodensity involving the supra ganglionic mid posterior left frontal lobe concerning for acute left MCA distribution infarct. No hemorrhage noted.  CT angiogram head and neck negative for large vessel occlusion.  No high-grade or correctable stenosis identified.  Patient did receive TPA.  MRI showed patchy  multifocal acute ischemic infarct involving the left cerebral hemisphere.  Associated mild scattered petechial hemorrhage without frank hemorrhagic transformation.  No significant regional mass-effect.   Echocardiogram ejection fraction of 60-65%. No wall motion abnormalities.  Bilateral lower extremity Dopplers negative.  TEE positive for PFO with bubbles crossing the atrial septum within 1-2 heartbeats.  No current plan for PFO closure follow-up per cardiology services.  Dr. Leonie Man recommends aspirin 81 mg daily and Plavix 75 mg daily for 3 weeks followed by  aspirin alone.  Subcutaneous Lovenox for DVT prophylaxis. Hospital course was complicated by bouts of urinary retention requiring I/O caths and has started on low dose Urecholine. Coagulopathy panel negative so far. He is tolerating a regular consistency diet.  Due to patient's right hemiparesis, dysarthria, aphasia he was admitted for a comprehensive rehab program.   Patient transferred to [facility] on 03/23/2020 .   Patient currently requires mod with mobility secondary to muscle weakness, decreased cardiorespiratoy endurance, impaired timing and sequencing, motor apraxia, decreased coordination and decreased motor planning and decreased sitting balance, decreased standing balance, decreased postural control, hemiplegia and decreased balance strategies.  Prior to hospitalization, patient was independent  with mobility and lived with Spouse in a House home.  Home access is 5Stairs to enter.  Patient will benefit from skilled PT intervention to maximize safe functional mobility and minimize fall risk for planned discharge home with 24 hour supervision.  Anticipate patient will benefit from follow up OP at discharge.  PT - End of Session Activity Tolerance: Tolerates 30+ min activity with multiple rests Endurance Deficit: Yes Endurance Deficit Description: Requires rest breaks during mobility tasks PT Assessment Rehab Potential (ACUTE/IP ONLY): Good PT  Barriers to Discharge: Home environment access/layout PT Patient  demonstrates impairments in the following area(s): Balance;Endurance;Motor;Perception;Safety PT Transfers Functional Problem(s): Bed Mobility;Bed to Chair;Car;Furniture PT Locomotion Functional Problem(s): Ambulation;Stairs PT Plan PT Intensity: Minimum of 1-2 x/day ,45 to 90 minutes PT Frequency: 5 out of 7 days PT Duration Estimated Length of Stay: 2.5-3 weeks PT Treatment/Interventions: Ambulation/gait training;Community reintegration;DME/adaptive equipment instruction;Neuromuscular re-education;Psychosocial support;Stair training;UE/LE Strength taining/ROM;Balance/vestibular training;Functional electrical stimulation;Discharge planning;Therapeutic Activities;Skin care/wound management;Pain management;UE/LE Coordination activities;Cognitive remediation/compensation;Disease management/prevention;Functional mobility training;Patient/family education;Splinting/orthotics;Therapeutic Exercise;Visual/perceptual remediation/compensation PT Transfers Anticipated Outcome(s): Supervision PT Locomotion Anticipated Outcome(s): Supervision PT Recommendation Follow Up Recommendations: 24 hour supervision/assistance Patient destination: Home Equipment Recommended: To be determined   PT Evaluation Precautions/Restrictions Precautions Precautions: Fall Restrictions Weight Bearing Restrictions: No General Chart Reviewed: Yes Family/Caregiver Present: Yes  Home Living/Prior Functioning Home Living Living Arrangements: Spouse/significant other;Children Available Help at Discharge: Family;Available 24 hours/day Type of Home: House Home Access: Stairs to enter [employer] of Steps: 5 Entrance Stairs-Rails: Right;Left;Can reach both Home Layout: Two level Alternate Level Stairs-Number of Steps: flight Alternate Level Stairs-Rails: Left  Lives With: Spouse Prior Function Level of Independence: Independent with basic  ADLs;Independent with homemaking with ambulation;Independent with gait  Able to Take Stairs?: Yes Driving: Yes Vocation: Full time employment Comments: working, 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: Within [place]: Impaired Praxis Impairment Details: Engineer, production Overall Cognitive Status: Impaired/Different from baseline Arousal/Alertness: Awake/alert Orientation Level: Oriented X4 Safety/Judgment: Appears intact Sensation Sensation Light Touch: Impaired by gross assessment Additional Comments: R hemibody dimished Coordination Gross Motor Movements are Fluid and Coordinated: No Fine Motor Movements are Fluid and Coordinated: No Heel Shin Test: Can perform but requires PT to guide through motion Motor  Motor Motor: Motor apraxia  Trunk/Postural Assessment  Cervical Assessment Cervical Assessment: Within Functional Limits Thoracic Assessment Thoracic Assessment: Within Functional Limits Lumbar Assessment Lumbar Assessment: Within Functional Limits Postural Control Postural Control: Deficits on evaluation (apraxic)  Balance Balance Balance Assessed: Yes Static Sitting Balance Static Sitting - Level of Assistance: 5: Stand by assistance Dynamic Sitting Balance Dynamic Sitting - Level of Assistance: 5: Stand by assistance Static Standing Balance Static Standing - Balance Support: During functional activity Static Standing - Level of Assistance: 4: Min assist Dynamic Standing Balance Dynamic Standing - Balance Support: During functional activity Dynamic Standing - Level of Assistance: 3: Mod assist Extremity Assessment  RLE Assessment General Strength Comments: Grossly 4+/5 LLE Assessment LLE Assessment: Within Functional Limits  Care Tool Care Tool Bed Mobility Roll left and right activity   Roll left and right assist level: Supervision/Verbal cueing    Sit to lying  activity   Sit to lying assist level: Supervision/Verbal cueing    Lying to sitting edge of bed activity   Lying to sitting edge of bed assist level: Supervision/Verbal cueing     Care Tool Transfers Sit to stand transfer   Sit to stand assist level: Moderate Assistance - Patient 50 - 74%    Chair/bed transfer   Chair/bed transfer assist level: Moderate Assistance - Patient 50 - 74%     Toilet transfer   Assist Level: Moderate Assistance - Patient 50 - 74%    Car transfer          Care Tool Locomotion Ambulation   Assist level: Moderate Assistance - Patient 50 - 74% Assistive device: No Device Max distance: 150'  Walk 10 feet activity   Assist level: Moderate Assistance - Patient - 50 - 74% Assistive device: Hand held assist   Walk 50 feet  with 2 turns activity   Assist level: Moderate Assistance - Patient - 50 - 74% Assistive device: No Device  Walk 150 feet activity   Assist level: Moderate Assistance - Patient - 50 - 74% Assistive device: No Device  Walk 10 feet on uneven surfaces activity   Assist level: Moderate Assistance - Patient - 50 - 74%    Stairs   Assist level: Minimal Assistance - Patient > 75% Stairs assistive device: 1 hand rail Max number of stairs: 12  Walk up/down 1 step activity   Walk up/down 1 step (curb) assist level: Minimal Assistance - Patient > 75% Walk up/down 1 step or curb assistive device: 1 hand rail    Walk up/down 4 steps activity Walk up/down 4 steps assist level: Minimal Assistance - Patient > 75% Walk up/down 4 steps assistive device: 1 hand rail  Walk up/down 12 steps activity   Walk up/down 12 steps assist level: Minimal Assistance - Patient > 75% Walk up/down 12 steps assistive device: 1 hand rail  Pick up small objects from floor        Wheelchair Will patient use wheelchair at discharge?: No          Wheel 50 feet with 2 turns activity      Wheel 150 feet activity        Refer to Care Plan for Long Term  Goals  SHORT TERM GOAL WEEK 1 PT Short Term Goal 1 (Week 1): Pt will perform bed to chair transfer with minA. PT Short Term Goal 2 (Week 1): Pt will ambulate 150' with minA and no AD. PT Short Term Goal 3 (Week 1): Pt will complete standing balance assessment.  Recommendations for other services: None   Skilled Therapeutic Intervention  Evaluation completed (see details above and below) with education on PT POC and goals and individual treatment initiated with focus on bed mobility, balance, transfers, ambulation, and stair training.  1st Session: Pt received supine in bed and agrees to therapy. No complaint of pain. Supine to sit with verbal cues on positioning for safety. Pt performs sit to stand with modA and no AD, then ambulates x15' to toilet with modA, demonstrating apraxia throughout R hemibody and trunk. Pt is continent of bowel and bladder on toilet. ModA for sit to stand. Pt performs majority of pericare as PT provides minA for standing balance. PT provides minA for thoroughness of hygiene. Pt ambulates to sink and stands at sink to wash hands with minA, prior to returning to seated position at EOB. Pt handed off to OT while sitting at EOB.  2nd Session: Pt received supine in bed and agrees to therapy. Supine to sit with supervision. Pt performs stand pivot transfer to [place] with modA. WC transport to gym for time management. Pt performs car transfer and ramp navigation with modA. Following seated rest break, pt ambulates x150' with modA and no AD, demonstrating apraxia and several LOBs. Pt performs x12 steps with L hand rail and light minA for stability, with PT demo on stair navigation technique. WC transport back to room. Pt left seated in WC with all needs within reach.  Mobility Bed Mobility Bed Mobility: Supine to Sit;Sit to Supine Supine to Sit: Supervision/Verbal cueing Sit to Supine: Supervision/Verbal cueing Transfers Transfers: Sit to Stand;Stand to Sit;Stand Pivot  Transfers Sit to Stand: Moderate Assistance - Patient 50-74% Stand to Sit: Moderate Assistance - Patient 50-74% Stand Pivot Transfers: Moderate Assistance - Patient 50 - 74% Stand Pivot Transfer Details: Verbal  cues for technique;Verbal cues for sequencing;Tactile cues for posture;Tactile cues for sequencing;Verbal cues for gait pattern Transfer (Assistive device): None Locomotion  Gait Ambulation: Yes Gait Assistance: Moderate Assistance - Patient 50-74% Gait Distance (Feet): 150 Feet Assistive device: None Gait Assistance Details: Verbal cues for gait pattern;Verbal cues for technique;Verbal cues for sequencing;Tactile cues for posture Gait Gait: Yes Gait Pattern: Impaired Gait Pattern: Ataxic Gait velocity: reduced Stairs / Additional Locomotion Stairs: Yes Stairs Assistance: Minimal Assistance - Patient > 75% Stair Management Technique: One rail Left Number of Stairs: 12 Height of Stairs: 6 Ramp: Moderate Assistance - Patient 50 - 74% Curb: Minimal Assistance - Patient >75% Wheelchair Mobility Wheelchair Mobility: No   Discharge Criteria: Patient will be discharged from PT if patient refuses treatment 3 consecutive times without medical reason, if treatment goals not met, if there is a change in medical status, if patient makes no progress towards goals or if patient is discharged from hospital.  The above assessment, treatment plan, treatment alternatives and goals were discussed and mutually agreed upon: by patient and by family  Breck Coons, PT, DPT 03/24/2020, 4:00 PM

## 2020-03-24 NOTE — Progress Notes (Addendum)
Inpatient Rehabilitation Care Coordinator Assessment and Plan Patient Details  Name: Cody Henson MRN: [id number] Date of Birth: May 30, 1975  Today's Date: 03/24/2020  Hospital Problems: Principal Problem:   Left middle cerebral artery stroke [facility])  Past Medical History:  Past Medical History:  Diagnosis Date  . Depressed    Past Surgical History:  Past Surgical History:  Procedure Laterality Date  . BUBBLE STUDY  03/21/2020   Procedure: BUBBLE STUDY;  Surgeon: [name], MD;  Location: [place];  Service: Cardiology;;  . TEE WITHOUT CARDIOVERSION N/A 03/21/2020   Procedure: TRANSESOPHAGEAL ECHOCARDIOGRAM (TEE);  Surgeon: [name], MD;  Location: [place];  Service: Cardiology;  Laterality: N/A;   Social History:  reports that he has never smoked. He has never used smokeless tobacco. No history on file for alcohol use and drug use.  Family / Support Systems Marital Status: Married How Long?: 4 years Patient Roles: Spouse,Parent Spouse/Significant Other: Joellen [place] (wife): [phone] Children: 34 y.o. dtr and one on the way Other Supports: None reported Anticipated Caregiver: wife Ability/Limitations of Caregiver: Wife will be patient's primary caregiver Caregiver Availability: 24/7 Family Dynamics: Pt lives at home with his wife and 42 y.o. dtr.  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 Karlene Lineman for medication management and counseling every 2 weeks Psychiatric History: See above Substance Abuse History: Pt states he has cut back on alochol and is only a social drinker. Occassional marijuana use.  Patient / Family Perceptions, Expectations & Goals Pt/Family understanding of illness & functional limitations: Pt and wife have general understanding of care needs Premorbid pt/family roles/activities: Independent Anticipated changes in roles/activities/participation: Assistance with ADLs/IADLs Pt/family expectations/goals: Pt goal is to work on left arm/leg, improve speech, and read again  [employer]: None Premorbid Home Care/DME Agencies: None Transportation available at discharge: Wife Resource referrals recommended: Neuropsychology  Discharge Planning Living Arrangements: Spouse/significant other,Children Support Systems: Spouse/significant other,Children Type of Residence: Private residence [employer]: Government social research officer (Wife reports a Chief of Staff should begin this month) Museum/gallery curator Resources: Secondary school teacher Screen Referred: No Living Expenses: Medical laboratory scientific officer Management: Spouse Does the patient have any problems obtaining your medications?: No Home Management: Wife manages all homecare needs Patient/Family Preliminary Plans: No changes Care Coordinator Barriers to Discharge: Decreased caregiver support,Lack of/limited family support Care Coordinator Anticipated Follow Up Needs: HH/OP  Clinical Impression SW met with pt and pt wife in room to introduce self, explain role, and discuss discharge process. 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 No HCPOA. No DME prior to d/c. Pt is currently uninsured, and wife reports a BCBS policy should begin this month and will be retro active. SW waiting on follow-up on insurance information. PT wife also reports they are applying for SSDI and in process of  submitting application.   Arisbel Maione A Glenford Garis 03/24/2020, 1:58 PM

## 2020-03-24 NOTE — Progress Notes (Signed)
Patient foley cathter removed at 0500 per physician order with no complication. Patient is encourage to void.

## 2020-03-24 NOTE — Evaluation (Signed)
Occupational Therapy Assessment and Plan  Patient Details  Name: Cody Henson MRN: [id number] Date of Birth: April 12, 1975  OT Diagnosis: apraxia, ataxia, cognitive deficits and hemiplegia affecting dominant side Rehab Potential: Rehab Potential (ACUTE ONLY): Excellent ELOS: 2.5 - 3 weeks   Today's Date: 03/24/2020 OT Individual Time: 1000-1100 OT Individual Time Calculation (min): 60 min     Hospital Problem: Principal Problem:   Left middle cerebral artery stroke [facility])   Past Medical History:  Past Medical History:  Diagnosis Date  . Depressed    Past Surgical History:  Past Surgical History:  Procedure Laterality Date  . BUBBLE STUDY  03/21/2020   Procedure: BUBBLE STUDY;  Surgeon: [name], MD;  Location: [place];  Service: Cardiology;;  . TEE WITHOUT CARDIOVERSION N/A 03/21/2020   Procedure: TRANSESOPHAGEAL ECHOCARDIOGRAM (TEE);  Surgeon: [name], MD;  Location: [place];  Service: Cardiology;  Laterality: N/A;    Assessment & Plan Clinical Impression:   Cody Henson is a 45 year old right-handed male with history of depression maintained on Adderall as well as Zoloft. History from chart review due to aphasia. Patient lives with spouse and 49-year-old child. Wife is expecting in June. Two-level home 2 steps to entry.  Independent prior to admission.  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  He presented on 03/16/2018 with right hemiparesis, facial droop and aphasia. Admission chemistries unremarkable except potassium 3.4, glucose 116, WBCs 15,100, SARS coronavirus negative, urine drug screen positive marijuana. Cranial CT scan showed subtle evolving hypodensity involving the supra ganglionic mid posterior left frontal lobe concerning for acute left MCA distribution infarct. No hemorrhage noted.  CT angiogram head and neck negative for large vessel occlusion.  No high-grade or correctable stenosis identified.  Patient did receive TPA.  MRI  showed patchy multifocal acute ischemic infarct involving the left cerebral hemisphere.  Associated mild scattered petechial hemorrhage without frank hemorrhagic transformation.  No significant regional mass-effect.   Echocardiogram ejection fraction of 60-65%. No wall motion abnormalities.  Bilateral lower extremity Dopplers negative.  TEE positive for PFO with bubbles crossing the atrial septum within 1-2 heartbeats.  No current plan for PFO closure follow-up per cardiology services.  Dr. Leonie Man recommends aspirin 81 mg daily and Plavix 75 mg daily for 3 weeks followed by  aspirin alone.  Subcutaneous Lovenox for DVT prophylaxis. Hospital course was complicated by bouts of urinary retention requiring I/O caths and has started on low dose Urecholine. Coagulopathy panel negative so far. He is tolerating a regular consistency diet.  Due to patient's right hemiparesis, dysarthria, aphasia he was admitted for a comprehensive rehab program. Patient transferred to [facility] on 03/23/2020 .  Patient currently requires mod with basic self-care skills secondary to muscle weakness, decreased cardiorespiratoy endurance, motor apraxia, ataxia, decreased coordination and decreased motor planning, decreased problem solving and decreased memory and decreased sitting balance, decreased standing balance and decreased balance strategies.  Prior to hospitalization, patient could complete ADLs and IADLs with independent .  Patient will benefit from skilled intervention to increase independence with basic self-care skills prior to discharge home with care partner.  Anticipate patient will require 24 hour supervision and follow up outpatient versus home health.  OT - End of Session Activity Tolerance: Tolerates 30+ min activity with multiple rests Endurance Deficit: Yes Endurance Deficit Description: Requires rest breaks during self care tasks OT Assessment Rehab Potential (ACUTE ONLY): Excellent OT Patient demonstrates  impairments in the following area(s): Balance;Safety;Cognition;Sensory;Endurance;Motor OT Basic ADL's Functional Problem(s): Grooming;Bathing;Dressing;Toileting OT  Transfers Functional Problem(s): Toilet;Tub/Shower OT Additional Impairment(s): Fuctional Use of Upper Extremity OT Plan OT Intensity: Minimum of 1-2 x/day, 45 to 90 minutes OT Frequency: 5 out of 7 days OT Duration/Estimated Length of Stay: 2.5 - 3 weeks OT Treatment/Interventions: Balance/vestibular training;Discharge planning;Self Care/advanced ADL retraining;Therapeutic Activities;UE/LE Coordination activities;Functional electrical stimulation;Cognitive remediation/compensation;Disease mangement/prevention;Functional mobility training;Patient/family education;Therapeutic Exercise;DME/adaptive equipment instruction;Neuromuscular re-education;Psychosocial support;UE/LE Strength taining/ROM OT Basic Self-Care Anticipated Outcome(s): supervision OT Toileting Anticipated Outcome(s): supervision OT Bathroom Transfers Anticipated Outcome(s): supervision OT Recommendation Patient destination: Home Follow Up Recommendations: 24 hour supervision/assistance Equipment Recommended: To be determined   OT Evaluation Precautions/Restrictions  Precautions Precautions: Fall Restrictions Weight Bearing Restrictions: No General Chart Reviewed: Yes Vital Signs Therapy Vitals Temp: 98.2 F (36.8 C) Pulse Rate: 86 Resp: 18 BP: 123/65 Patient Position (if appropriate): Lying Oxygen Therapy SpO2: 100 % O2 Device: Room Air Pain Pain Assessment Pain Scale: 0-10 Pain Score: 0-No pain Home Living/Prior Functioning Home Living Family/patient expects to be discharged to:: Private residence Living Arrangements: Spouse/significant other,Children Available Help at Discharge: Family,Available 24 hours/day Type of Home: House Home Access: Stairs to enter [employer] of Steps: 5 Entrance Stairs-Rails: Right,Left,Can reach  both Home Layout: Two level Alternate Level Stairs-Number of Steps: flight Alternate Level Stairs-Rails: Left Bathroom Shower/Tub: Multimedia programmer: Standard  Lives With: Spouse Prior Function Level of Independence: Independent with basic ADLs,Independent with homemaking with ambulation,Independent with gait,Independent with transfers  Able to Take Stairs?: Yes Driving: Yes Vocation: Full time employment Comments: working, 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, structural Baseline Vision/History: No visual deficits Patient Visual Report: Other (comment) (pt indicating having "floaters" and reports he had these off and on at baseline) Vision Assessment?: No apparent visual deficits Perception  Perception: Within Functional Limits Praxis Praxis: Impaired Praxis Impairment Details: Motor planning Cognition Overall Cognitive Status: Impaired/Different from baseline Arousal/Alertness: Awake/alert Orientation Level: Person;Situation;Place Person: Oriented Place: Oriented Situation: Oriented Year: 2022 Month: February Day of Week: Correct Memory: Impaired Memory Impairment: Retrieval deficit;Storage deficit Immediate Memory Recall: Sock;Blue;Bed Memory Recall Sock: Without Cue Memory Recall Blue: Without Cue Memory Recall Bed: Not able to recall Attention: Sustained;Alternating Sustained Attention: Appears intact Awareness: Appears intact Problem Solving: Appears intact Safety/Judgment: Appears intact Sensation Sensation Light Touch: Impaired Detail Light Touch Impaired Details: Impaired RUE;Impaired RLE Hot/Cold: Appears Intact Proprioception: Impaired Detail Proprioception Impaired Details: Impaired RUE;Impaired RLE Stereognosis: Not tested Additional Comments: R hemibody dimished Coordination Gross Motor Movements are Fluid and Coordinated: No Fine Motor Movements are Fluid and Coordinated: No Finger Nose Finger Test: Slow with reduced  precision RUE; WNL LUE Heel Shin Test: Can perform but requires PT to guide through motion Motor  Motor Motor: Ataxia;Motor apraxia  Trunk/Postural Assessment  Cervical Assessment Cervical Assessment: Within Functional Limits Thoracic Assessment Thoracic Assessment: Within Functional Limits Lumbar Assessment Lumbar Assessment: Within Functional Limits Postural Control Postural Control: Deficits on evaluation  Balance Balance Balance Assessed: Yes Static Sitting Balance Static Sitting - Level of Assistance: 5: Stand by assistance Dynamic Sitting Balance Dynamic Sitting - Level of Assistance: 5: Stand by assistance Sitting balance - Comments: Sitting EOB no LOB, supervision for safety. Static Standing Balance Static Standing - Balance Support: During functional activity Static Standing - Level of Assistance: 4: Min assist Dynamic Standing Balance Dynamic Standing - Balance Support: During functional activity Dynamic Standing - Level of Assistance: 3: Mod assist Extremity/Trunk Assessment RUE Assessment RUE Assessment: Exceptions to [facility] Passive Range of Motion (PROM) Comments: WNL Active Range of Motion (AROM) Comments: WFL General Strength Comments:  4-/5 LUE Assessment LUE Assessment: Within Functional Limits  Care Tool Care Tool Self Care Eating   Eating Assist Level: Set up assist    Oral Care    Oral Care Assist Level: Set up assist    Bathing   Body parts bathed by patient: Right arm;Left arm;Chest;Abdomen;Front perineal area;Buttocks;Right upper leg;Left upper leg;Face;Left lower leg;Right lower leg     Assist Level: Minimal Assistance - Patient > 75%    Upper Body Dressing(including orthotics)   What is the patient wearing?: Pull over shirt   Assist Level: Moderate Assistance - Patient 50 - 74%    Lower Body Dressing (excluding footwear)   What is the patient wearing?: Underwear/pull up;Pants Assist for lower body dressing: Moderate Assistance - Patient  50 - 74%    Putting on/Taking off footwear   What is the patient wearing?: Non-skid slipper socks Assist for footwear: Supervision/Verbal cueing       Care Tool Toileting Toileting activity   Assist for toileting: Minimal Assistance - Patient > 75%     Care Tool Bed Mobility Roll left and right activity   Roll left and right assist level: Supervision/Verbal cueing    Sit to lying activity   Sit to lying assist level: Supervision/Verbal cueing    Lying to sitting edge of bed activity   Lying to sitting edge of bed assist level: Supervision/Verbal cueing     Care Tool Transfers Sit to stand transfer   Sit to stand assist level: Moderate Assistance - Patient 50 - 74%    Chair/bed transfer   Chair/bed transfer assist level: Moderate Assistance - Patient 50 - 74%     Toilet transfer   Assist Level: Moderate Assistance - Patient 50 - 74%     Care Tool Cognition Expression of Ideas and Wants Expression of Ideas and Wants: Some difficulty - exhibits some difficulty with expressing needs and ideas (e.g, some words or finishing thoughts) or speech is not clear   Understanding Verbal and Non-Verbal Content Understanding Verbal and Non-Verbal Content: Usually understands - understands most conversations, but misses some part/intent of message. Requires cues at times to understand   Memory/Recall Ability *first 3 days only Memory/Recall Ability *first 3 days only: Location of own room;That he or she is in a hospital/hospital unit;Current season    Refer to Care Plan for Long Term Goals  SHORT TERM GOAL WEEK 1 OT Short Term Goal 1 (Week 1): Pt will complete LB dressing with CGA OT Short Term Goal 2 (Week 1): Pt will complete toileting with CGA OT Short Term Goal 3 (Week 1): Pt will complete toilet transfer with CGA OT Short Term Goal 4 (Week 1): Pt will complete LB bathing with CGA  Recommendations for other services: None    Skilled Therapeutic Intervention ADL ADL Grooming:  Setup Where Assessed-Grooming: Sitting at sink Upper Body Bathing: Supervision/safety Where Assessed-Upper Body Bathing: Sitting at sink Lower Body Bathing: Minimal assistance Where Assessed-Lower Body Bathing: Standing at sink;Sitting at sink Upper Body Dressing: Moderate assistance Where Assessed-Upper Body Dressing: Sitting at sink Lower Body Dressing: Moderate assistance Where Assessed-Lower Body Dressing: Sitting at sink;Standing at sink Toileting: Minimal assistance Where Assessed-Toileting: Glass blower/designer: Moderate assistance Toilet Transfer Method: Stand pivot Mobility  Bed Mobility Bed Mobility: Supine to Sit Supine to Sit: Supervision/Verbal cueing Sit to Supine: Supervision/Verbal cueing Transfers Sit to Stand: Moderate Assistance - Patient 50-74% Stand to Sit: Moderate Assistance - Patient 50-74%   Discharge Criteria: Patient will be discharged from OT  if patient refuses treatment 3 consecutive times without medical reason, if treatment goals not met, if there is a change in medical status, if patient makes no progress towards goals or if patient is discharged from hospital.  Skilled Intervention:  Pt semi upright in bed, no c/o pain, agreeable to OT session.  Completed OT initial evaluation, collaborated with pt regarding POC, and educated pt on OT scope of practice.  Pt completed self care and functional mobility requiring above levels of assist.  Pt returned to bed at end of session, call bell in reach, bed alarm on.  The above assessment, treatment plan, treatment alternatives and goals were discussed and mutually agreed upon: by patient  Ezekiel Slocumb 03/24/2020, 4:59 PM

## 2020-03-25 DIAGNOSIS — E785 Hyperlipidemia, unspecified: Secondary | ICD-10-CM

## 2020-03-25 DIAGNOSIS — G8191 Hemiplegia, unspecified affecting right dominant side: Secondary | ICD-10-CM

## 2020-03-25 DIAGNOSIS — R030 Elevated blood-pressure reading, without diagnosis of hypertension: Secondary | ICD-10-CM

## 2020-03-25 NOTE — Progress Notes (Signed)
Physical Therapy Session Note  Patient Details  Name: Cody Henson MRN: [id number] Date of Birth: 08/13/1975  Today's Date: 03/25/2020 PT Individual Time: 1131-1200 PT Individual Time Calculation (min): 29 min   Short Term Goals: Week 1:  PT Short Term Goal 1 (Week 1): Pt will perform bed to chair transfer with minA. PT Short Term Goal 2 (Week 1): Pt will ambulate 150' with minA and no AD. PT Short Term Goal 3 (Week 1): Pt will complete standing balance assessment.  Skilled Therapeutic Interventions/Progress Updates:     Pt received supine in bed and agrees to therapy. No complaint of pain. Supine to sit with supervision and cue son positioning at EOB. PT provides modA to don shoes at EOB. Pt perform sit to stand with minA. Pt ambulates x150' to gym with minA ad hips and pt cued to increased arm swing, trunk rotation, and gait speed, as well as to maintain upright gaze to improve posture and balance. Pt then performs [employer], as detailed below. Pt demonstrates most difficulty with transitional movements. Multiple seated rest breaks taken during assessment and CGA/minA provided for each sit to stand. Pt ambulates 150' back to room with minA. Sit to supine with cues on positioning. Left supine in bed with alarm intact and all needs within reach.  Therapy Documentation Precautions:  Precautions Precautions: Fall Restrictions Weight Bearing Restrictions: No Balance: Standardized Balance Assessment Standardized Balance Assessment: Berg Balance Test Berg Balance Test Sit to Stand: Needs minimal aid to stand or to stabilize Standing Unsupported: Able to stand 2 minutes with supervision Sitting with Back Unsupported but Feet Supported on Floor or Stool: Able to sit safely and securely 2 minutes Stand to Sit: Controls descent by using hands Transfers: Needs one person to assist Standing Unsupported with Eyes Closed: Able to stand 10 seconds with supervision Standing Ubsupported  with Feet Together: Able to place feet together independently and stand for 1 minute with supervision From Standing, Reach Forward with Outstretched Arm: Can reach forward >12 cm safely (5") From Standing Position, Pick up Object from Floor: Able to pick up shoe, needs supervision From Standing Position, Turn to Look Behind Over each Shoulder: Looks behind from both sides and weight shifts well Turn 360 Degrees: Able to turn 360 degrees safely but slowly Standing Unsupported, Alternately Place Feet on Step/Stool: Able to stand independently and complete 8 steps >20 seconds Standing Unsupported, One Foot in Front: Able to plae foot ahead of the other independently and hold 30 seconds Standing on One Leg: Able to lift leg independently and hold > 10 seconds Total Score: 40    Therapy/Group: Individual Therapy  Breck Coons, PT, DPT 03/25/2020, 12:31 PM

## 2020-03-25 NOTE — Progress Notes (Signed)
Speech Language Pathology Daily Session Note  Patient Details  Name: Cody Henson MRN: [id number] Date of Birth: 1975/11/28  Today's Date: 03/25/2020 SLP Individual Time: 1000-1100 SLP Individual Time Calculation (min): 60 min  Short Term Goals: Week 1: SLP Short Term Goal 1 (Week 1): Patient will produce 3-4 syllable words/phrases with 80% accuracy and minA cues SLP Short Term Goal 2 (Week 1): Patient will describe object function at phrase level with 80% accuracy and modA cues. SLP Short Term Goal 3 (Week 1): Patient will perform oral reading at phrase level with 75% accuracy and mod A cues. SLP Short Term Goal 4 (Week 1): Patient will follow 2 step directions/pointing with modA cues.  Skilled Therapeutic Interventions:   Patient participated in completing majority of sections from Apraxia Battery for Adults. He demonstrated more limb apraxia than oral apraxia as per this test, and significant difficulty with describing pictures. He completed oral reading at sentence level with SLP covering up words above and below in paragraph (Grandfather passage) as patient was not able to perform otherwise. Patient required SLP to point to and at times cover up word endings when he was stuck. Patient completed trial of short paragraph reading comprehension without SLP assistance and answered 1/3 comprehension questions correctly (plan to go more in depth with this. Patient continues to benefit from skilled SLP intervention to maximize speech-language abilities prior to discharge.  Pain Pain Assessment Pain Scale: 0-10 Pain Score: 0-No pain  Therapy/Group: Individual Therapy  Sonia Baller, MA, CCC-SLP Speech Therapy

## 2020-03-25 NOTE — Progress Notes (Signed)
[facility] PHYSICAL MEDICINE & REHABILITATION PROGRESS NOTE   Subjective/Complaints: Patient seen sitting up in his chair this AM, completing therapies.  He states he slept fairly overnight due to being restless as result of being in the hospital.  He notes therapies are going well.    ROS: Appears to deny CP, shortness of breath, nausea, vomiting, diarrhea.  Objective:   No results found. Recent Labs    03/24/20 0504  WBC 8.3  HGB 15.2  HCT 45.5  PLT 296   Recent Labs    03/23/20 1852 03/24/20 0504  NA  --  138  K  --  4.0  CL  --  100  CO2  --  28  GLUCOSE  --  89  BUN  --  16  CREATININE 0.99 1.14  CALCIUM  --  9.5    Intake/Output Summary (Last 24 hours) at 03/25/2020 1257 Last data filed at 03/25/2020 4782 Gross per 24 hour  Intake 595 ml  Output 1000 ml  Net -405 ml        Physical Exam: Vital Signs Blood pressure (!) 132/98, pulse 62, temperature (!) 97.4 F (36.3 C), temperature source Oral, resp. rate 17, height 5\' 8"  (1.727 m), weight 71.8 kg, SpO2 97 %. Constitutional: No distress . Vital signs reviewed. HENT: Normocephalic.  Atraumatic. Eyes: EOMI. No discharge. Cardiovascular: No JVD.  RRR. Respiratory: Normal effort.  No stridor.  Bilateral clear to auscultation. GI: Non-distended.  BS +. Skin: Warm and dry.  Intact. Psych: Normal mood.  Normal behavior. Musc: No edema in extremities.  No tenderness in extremities. Neuro: Alert Expressive  >> receptive aphasia Motor: RUE: 4+/5 with apraxia proximal to distal RLE: 4+/5 proximal to distal No increase in tone appreciated   Assessment/Plan: 1. Functional deficits which require 3+ hours per day of interdisciplinary therapy in a comprehensive inpatient rehab setting.  Physiatrist is providing close team supervision and 24 hour management of active medical problems listed below.  Physiatrist and rehab team continue to assess barriers to discharge/monitor patient progress toward functional and  medical goals  Care Tool:  Bathing    Body parts bathed by patient: Right arm,Left arm,Chest,Abdomen,Front perineal area,Buttocks,Right upper leg,Left upper leg,Face,Left lower leg,Right lower leg         Bathing assist Assist Level: Minimal Assistance - Patient > 75%     Upper Body Dressing/Undressing Upper body dressing   What is the patient wearing?: Pull over shirt    Upper body assist Assist Level: Moderate Assistance - Patient 50 - 74%    Lower Body Dressing/Undressing Lower body dressing      What is the patient wearing?: Underwear/pull up,Pants     Lower body assist Assist for lower body dressing: Moderate Assistance - Patient 50 - 74%     Toileting Toileting    Toileting assist Assist for toileting: Independent with assistive device Assistive Device Comment: urinal   Transfers Chair/bed transfer  Transfers assist     Chair/bed transfer assist level: Moderate Assistance - Patient 50 - 74%     Locomotion Ambulation   Ambulation assist      Assist level: Moderate Assistance - Patient 50 - 74% Assistive device: No Device Max distance: 150'   Walk 10 feet activity   Assist     Assist level: Moderate Assistance - Patient - 50 - 74% Assistive device: Hand held assist   Walk 50 feet activity   Assist    Assist level: Moderate Assistance - Patient - 21 -  74% Assistive device: No Device    Walk 150 feet activity   Assist    Assist level: Moderate Assistance - Patient - 50 - 74% Assistive device: No Device    Walk 10 feet on uneven surface  activity   Assist     Assist level: Moderate Assistance - Patient - 50 - 74%     Wheelchair     Assist Will patient use wheelchair at discharge?: No             Wheelchair 50 feet with 2 turns activity    Assist            Wheelchair 150 feet activity     Assist          Blood pressure (!) 132/98, pulse 62, temperature (!) 97.4 F (36.3 C), temperature  source Oral, resp. rate 17, height 5\' 8"  (1.727 m), weight 71.8 kg, SpO2 97 %.  Medical Problem List and Plan: 1.  Right hemiparesis with aphasia secondary to acute ischemic infarct left MCA distribution.  Status post TPA  Continue CIR 2.  Antithrombotics: -DVT/anticoagulation: Lovenox             -antiplatelet therapy: Aspirin 81 mg daily and Plavix 75 mg daily x3 weeks and aspirin alone 3. Pain Management: Tylenol as needed 4. Mood: Zoloft 50 mg daily.  Provide emotional support             -antipsychotic agents: N/A 5. Neuropsych: This patient is not fully capable of making decisions on his own behalf. 6. Skin/Wound Care: Routine skin checks 7. Fluids/Electrolytes/Nutrition:               -CMET WNL 8. PFO identified on TEE.  Follow-up cardiology services outpatient 9. Hypertension.  No antihypertensive medications prior to admission.    Diastolic pressures elevated on 2/4 10. Urinary retention: continue voiding trial  -OOB to toilet/commode to empty              Continue low dose urecholine urecholine  Improving 11.  Dyslipidemia: Lipitor 12. Depression: On Zoloft    LOS: 2 days A FACE TO FACE EVALUATION WAS PERFORMED  Jennamarie Goings [name] 03/25/2020, 12:57 PM

## 2020-03-25 NOTE — Care Management (Signed)
[facility] Individual Statement of Services  Patient Name:  Cody Henson  Date:  03/25/2020  Welcome to the [facility].  Our goal is to provide you with an individualized program based on your diagnosis and situation, designed to meet your specific needs.  With this comprehensive rehabilitation program, you will be expected to participate in at least 3 hours of rehabilitation therapies Monday-Friday, with modified therapy programming on the weekends.  Your rehabilitation program will include the following services:  Physical Therapy (PT), Occupational Therapy (OT), Speech Therapy (ST), 24 hour per day rehabilitation nursing, Therapeutic Recreaction (TR), Psychology, Neuropsychology, Care Coordinator, Rehabilitation Medicine, Nutrition Services, Pharmacy Services and Other  Weekly team conferences will be held on Tuesdays to discuss your progress.  Your Inpatient Rehabilitation Care Coordinator will talk with you frequently to get your input and to update you on team discussions.  Team conferences with you and your family in attendance may also be held.  Expected length of stay: 2-3 weeks   Overall anticipated outcome: Supervision  Depending on your progress and recovery, your program may change. Your Inpatient Rehabilitation Care Coordinator will coordinate services and will keep you informed of any changes. Your Inpatient Rehabilitation Care Coordinator's name and contact numbers are listed  below.  The following services may also be recommended but are not provided by the [facility] will be made to provide these services after discharge if needed.  Arrangements include referral to agencies that provide these services.  Your insurance has been verified to be:  Uninsured  Your primary doctor  is:  No PCP  Pertinent information will be shared with your doctor and your insurance company.  Inpatient Rehabilitation Care Coordinator:  Cathleen Corti [phone] or ([phone]  Information discussed with and copy given to patient by: Rana Snare, 03/25/2020, 3:24 PM

## 2020-03-25 NOTE — Progress Notes (Signed)
Physical Therapy Session Note  Patient Details  Name: Cody Henson MRN: [id number] Date of Birth: [birth year]  Today's Date: 03/25/2020 PT Individual Time: 1415-1445 PT Individual Time Calculation (min): 30 min   Short Term Goals: Week 1:  PT Short Term Goal 1 (Week 1): Pt will perform bed to chair transfer with minA. PT Short Term Goal 2 (Week 1): Pt will ambulate 150' with minA and no AD. PT Short Term Goal 3 (Week 1): Pt will complete standing balance assessment.  Skilled Therapeutic Interventions/Progress Updates:     Patient in bed upon PT arrival. Patient alert and agreeable to PT session. Patient denied pain during session.  Therapeutic Activity: Bed Mobility: Patient performed supine to sit with supervision with increased time due to apraxia. Provided verbal cues for rolling to side-lying before sitting for reduced abdominal effort with mobility. Transfers: Patient performed sit to/from stand x3 and stand pivot x1 with CGA for safety balance without AD. Provided verbal cues for forward weight shift. He performed sit to stand x1 without upper extremity support to emphasize importance of forward weight shift for improved balance with transfers.   Gait Training:  Patient ambulated >50 feet x3 without an AD with min A-CGA. Ambulated with increased step length, narrow BOS with intermittent R foot catching on L during swing, decreased R foot clearance, and increased R upper extremity flexor tone. Provided verbal cues for increased hamstring activation in pre-swing, heel strike at initial contact, increased BOS, reduced step length for improved control during SLS, and R upper extremity relation and arm swing. 6 Min Walk Test:  Instructed patient to ambulate as quickling and as safely as possible for 6 minutes using LRAD. Patient was allowed to take standing rest breaks without stopping the test, but if he required a sitting rest break the clock would be stopped and the test would be over.   Results: 695 feet without AD with CGA-min A  Patient sitting EOB handed off to OT at end of session with breaks locked, bed alarm set, and all needs within reach.    Therapy Documentation Precautions:  Precautions Precautions: Fall Restrictions Weight Bearing Restrictions: No   Therapy/Group: Individual Therapy  Terree Gaultney L Jae Skeet PT, DPT  03/25/2020, 4:27 PM

## 2020-03-25 NOTE — Progress Notes (Signed)
Occupational Therapy Session Note  Patient Details  Name: Cody Henson MRN: [id number] Date of Birth: [birth year]  Today's Date: 03/25/2020 OT Individual Time: 0830-0900 OT Individual Time Calculation (min): 30 min    Short Term Goals: Week 1:  OT Short Term Goal 1 (Week 1): Pt will complete LB dressing with CGA OT Short Term Goal 2 (Week 1): Pt will complete toileting with CGA OT Short Term Goal 3 (Week 1): Pt will complete toilet transfer with CGA OT Short Term Goal 4 (Week 1): Pt will complete LB bathing with CGA  Skilled Therapeutic Interventions/Progress Updates:    1:1. Pt received in w/c with no pain reported and requesting to work on arm. Focus of session on RUE assessments: Box and blocks: RUE-10 LUE-65  Dynamometer RUE 29# avg over 3 trials  LUE 125# avg over 3 trials  Pt and OT discuss current motor compensations of RUE- shoulder hike and "chicken wing." With tactile cues ad MIN A, pt able to reach forward/back without compensation to begin to feel the difference. Pt will likely need mirror to compensate for sensory deficits. Exited session with pt seated in w/c, exit alarm on and call light in reach   Therapy Documentation Precautions:  Precautions Precautions: Fall Restrictions Weight Bearing Restrictions: No General:   Vital Signs: Therapy Vitals Temp: (!) 97.4 F (36.3 C) Temp Source: Oral Pulse Rate: 62 Resp: 17 BP: (!) 132/98 Patient Position (if appropriate): Lying Oxygen Therapy SpO2: 97 % O2 Device: Room Air Pain:   ADL: ADL Grooming: Setup Where Assessed-Grooming: Sitting at sink Upper Body Bathing: Supervision/safety Where Assessed-Upper Body Bathing: Sitting at sink Lower Body Bathing: Minimal assistance Where Assessed-Lower Body Bathing: Standing at sink,Sitting at sink Upper Body Dressing: Moderate assistance Where Assessed-Upper Body Dressing: Sitting at sink Lower Body Dressing: Moderate assistance Where Assessed-Lower Body Dressing:  Sitting at sink,Standing at sink Toileting: Minimal assistance Where Assessed-Toileting: Glass blower/designer: Moderate assistance Toilet Transfer Method: Stand pivot Vision   Perception    Praxis   Exercises:   Other Treatments:     Therapy/Group: Individual Therapy  Tonny Branch 03/25/2020, 6:44 AM

## 2020-03-25 NOTE — Progress Notes (Signed)
Physical Therapy Session Note  Patient Details  Name: Cody Henson MRN: [id number] Date of Birth: [birth year]  Today's Date: 03/25/2020 PT Individual Time: 0800-0828 PT Individual Time Calculation (min): 28 min   Short Term Goals: Week 1:  PT Short Term Goal 1 (Week 1): Pt will perform bed to chair transfer with minA. PT Short Term Goal 2 (Week 1): Pt will ambulate 150' with minA and no AD. PT Short Term Goal 3 (Week 1): Pt will complete standing balance assessment.  Skilled Therapeutic Interventions/Progress Updates:     Pt received supine in bed and agrees to therapy. PT dons pt's socks and shoes for time management. Supine to sit with supervision and cues on positioning at EOB. Stand pivot transfer to [facility] with minA. WC transport to gym for time management. Pt performs NMR for standing balance with emphasis on R lower extremity stance control. PT provides minA at hips and pt performs targeted toe taps toward colored circles. Pt performs consistently with minA and stepping to correct circle when given 1 step command. When PT provides two consecutive colors, however, pt has significant difficulty performing task, frequently forgetting order or color and taking increased time to complete. Pt ambulates back to room, x150', with minA and notable ataxia. Left seated in WC with alarm intact and all needs within reach.  Therapy Documentation Precautions:  Precautions Precautions: Fall Restrictions Weight Bearing Restrictions: No   Therapy/Group: Individual Therapy  Breck Coons, PT, DPT 03/25/2020, 9:57 AM

## 2020-03-25 NOTE — Progress Notes (Signed)
Occupational Therapy Session Note  Patient Details  Name: Cody Henson MRN: [id number] Date of Birth: 02-28-1975  Today's Date: 03/25/2020 OT Individual Time: 8563-1497 OT Individual Time Calculation (min): 41 min    Short Term Goals: Week 1:  OT Short Term Goal 1 (Week 1): Pt will complete LB dressing with CGA OT Short Term Goal 2 (Week 1): Pt will complete toileting with CGA OT Short Term Goal 3 (Week 1): Pt will complete toilet transfer with CGA OT Short Term Goal 4 (Week 1): Pt will complete LB bathing with CGA  Skilled Therapeutic Interventions/Progress Updates:    Pt greeted via PT handoff. He was agreeable to start session by completing oral care. Mod A for stand pivot<w/c without AD. CGA for standing balance when standing at the sink during stated task. Worked on equal LE weightbearing and bimanual methods for incorporating the Rt hand when using the toothbrush and rinsing mouth with cup of water + mouthwash. Note that pt did not have enough strength in the Rt hand to dispense his toothpaste. When sitting down, we discussed eating and pt reports that he uses his nondominant Lt hand to eat at this time. When pt practiced bringing food to mouth using a utensil with the Rt hand, pt exhibited a lot of compensatory strategies at the shoulder. Worked on isolated control of mostly the biceps when bringing utensil to mouth. Noted some dropping episodes using a standard utensil when bringing utensil to mouth x10 reps 2 sets, pt reports sensation deficits in the Rt hand as well. Provided pt with a built up utensil and he had no episodes of dropping after 10 reps of stated repeated motion to simulate eating tasks. He needed assistance for wrist rotation to simulate "scooping" food. Taught him a self ROM exercise to work on improving flexibility of the wrist. Emphasized importance of repetitive motion to enhance functional return in the UE, encouraged him to use the Rt hand as much as possible when eating  his meals. Pt reported understanding. At end of session pt wanted to remain sitting up in the w/c, left him with all needs within reach and safety belt fastened.   Therapy Documentation Precautions:  Precautions Precautions: Fall Restrictions Weight Bearing Restrictions: No Vital Signs: Therapy Vitals Temp: 98.3 F (36.8 C) Pulse Rate: 78 Resp: 18 BP: (!) 137/102 Patient Position (if appropriate): Sitting Oxygen Therapy SpO2: 96 % O2 Device: Room Air Pain: no c/o pain during session    ADL: ADL Grooming: Setup Where Assessed-Grooming: Sitting at sink Upper Body Bathing: Supervision/safety Where Assessed-Upper Body Bathing: Sitting at sink Lower Body Bathing: Minimal assistance Where Assessed-Lower Body Bathing: Standing at sink,Sitting at sink Upper Body Dressing: Moderate assistance Where Assessed-Upper Body Dressing: Sitting at sink Lower Body Dressing: Moderate assistance Where Assessed-Lower Body Dressing: Sitting at sink,Standing at sink Toileting: Minimal assistance Where Assessed-Toileting: Glass blower/designer: Moderate assistance Toilet Transfer Method: Stand pivot      Therapy/Group: Individual Therapy  Chanese Hartsough A Reianna Batdorf 03/25/2020, 4:11 PM

## 2020-03-26 NOTE — Progress Notes (Signed)
Speech Language Pathology Daily Session Note  Patient Details  Name: Isahia Hollerbach MRN: [id number] Date of Birth: April 25, 1975  Today's Date: 03/26/2020 SLP Individual Time: 0730-0829 SLP Individual Time Calculation (min): 59 min  Short Term Goals: Week 1: SLP Short Term Goal 1 (Week 1): Patient will produce 3-4 syllable words/phrases with 80% accuracy and minA cues SLP Short Term Goal 2 (Week 1): Patient will describe object function at phrase level with 80% accuracy and modA cues. SLP Short Term Goal 3 (Week 1): Patient will perform oral reading at phrase level with 75% accuracy and mod A cues. SLP Short Term Goal 4 (Week 1): Patient will follow 2 step directions/pointing with modA cues.  Skilled Therapeutic Interventions: Skilled SLP intervention focused on verbal expression. Pt formulated questions with Min A visual models. He reported he notices improvement today in his speech due to getting more sleep last night. He read 4-5  word sentences with min to moderate assistance. Pt benefits from visual model for initial sounds to increase verbal expression. He demonstrated good awareness of phonemic paraphasias and when given additional time he was able to produce words accurately. Pt left seated upright in wheelchair with chair alarm set and call bell within reach. Cont therapy per plan of care.      Pain Pain Assessment Pain Scale: Faces Pain Score: 0-No pain  Therapy/Group: Individual Therapy  Darrol Poke Gracelynn Bircher 03/26/2020, 8:24 AM

## 2020-03-26 NOTE — Progress Notes (Signed)
Occupational Therapy Session Note  Patient Details  Name: Cody Henson MRN: [id number] Date of Birth: 16-May-1975  Today's Date: 03/26/2020 OT Individual Time: 0930-1030 OT Individual Time Calculation (min): 60 min    Short Term Goals: Week 1:  OT Short Term Goal 1 (Week 1): Pt will complete LB dressing with CGA OT Short Term Goal 2 (Week 1): Pt will complete toileting with CGA OT Short Term Goal 3 (Week 1): Pt will complete toilet transfer with CGA OT Short Term Goal 4 (Week 1): Pt will complete LB bathing with CGA  Skilled Therapeutic Interventions/Progress Updates:    1:1. Pt received in bed agreeable to OT. Pt with no pain and wanting to shower then work on his RUE. Pt completes standing toileting for continent urine void with S. Pt completes stand pivot transfer with MIN A into shower and bathes seated using alteral leans with MIN A only for NMR of RUE to wash LUE with HOHA. Pt dresses with VC for hemi strategy for UB dressing and carries over for LB dressing with MIN A sit to stand. Pt unable to fasten shoes. Pt stands at high low tabletop to WB RUE into shoe for NMR and unlaces/relaces shoes to install elastic laces. Pt requires MIN VC for visual perceptual skills for relacing shoes. Pt stands at counter and loads dishes into cabinet at head heights with pt impaired sensation impacting calibration skill and reaching skills. Pt requires MIN stabilization, mod tactile cues at shoulder to decrease shoulder hike and VC for not "chicken winging." Exited session with pt seated in w/c, exit alarm on and call light in reach   Therapy Documentation Precautions:  Precautions Precautions: Fall Restrictions Weight Bearing Restrictions: No General:   Vital Signs: Therapy Vitals Temp: 97.9 F (36.6 C) Pulse Rate: 76 Resp: 18 BP: 130/84 Patient Position (if appropriate): Lying Oxygen Therapy SpO2: 100 % O2 Device: Room Air Pain:   ADL: ADL Grooming: Setup Where Assessed-Grooming:  Sitting at sink Upper Body Bathing: Supervision/safety Where Assessed-Upper Body Bathing: Sitting at sink Lower Body Bathing: Minimal assistance Where Assessed-Lower Body Bathing: Standing at sink,Sitting at sink Upper Body Dressing: Moderate assistance Where Assessed-Upper Body Dressing: Sitting at sink Lower Body Dressing: Moderate assistance Where Assessed-Lower Body Dressing: Sitting at sink,Standing at sink Toileting: Minimal assistance Where Assessed-Toileting: Glass blower/designer: Moderate assistance Toilet Transfer Method: Stand pivot Vision   Perception    Praxis   Exercises:   Other Treatments:     Therapy/Group: Individual Therapy  Tonny Branch 03/26/2020, 6:48 AM

## 2020-03-26 NOTE — Progress Notes (Signed)
Physical Therapy Session Note  Patient Details  Name: Cody Henson MRN: [id number] Date of Birth: 05-29-1975  Today's Date: 03/26/2020 PT Individual Time: 1300-1400 PT Individual Time Calculation (min): 60 min   Short Term Goals: Week 1:  PT Short Term Goal 1 (Week 1): Pt will perform bed to chair transfer with minA. PT Short Term Goal 2 (Week 1): Pt will ambulate 150' with minA and no AD. PT Short Term Goal 3 (Week 1): Pt will complete standing balance assessment. Week 2:    Week 3:     Skilled Therapeutic Interventions/Progress Updates:    PAIN  Denies pain  Pt initially oob in wc w/wife tending to nails.   Pt Sit to stand w/cga and gait >316ft no AD, occasionally catches R toe on L heel when advancing thru swing but does not lose balance, increased flexor tone RUE/able to correct w/frequent cues, occasionally steps to midline RLE.  Functional gait:  57ft x 4 including weaving thru cones, stepping over 5x 5 inch and 3x 18depth mat turning 180 at each end w/ increased RUE flexor tone and incosisntent step width, decreased cadence overall, very slow to negotiate stepping over objects w/decreased coordination RLE w/task.  Agility using floor ladder -  Simple pattern to increased complexity for dual task/cognitive/motor planning challenge.  Gradually increased speed and coordination of RLE w/task w/repetition.  Increased cueing needed to follow pattern w/increased complexity, increased RUE tone w/increased challenge. Overall cga  Stepping over weigths along 20ft path x 4  w/RLE to encourage increased hip and knee flexion, increased attention to RLE w/swing,  coordination.  cga for activity.  Repeated x 2   Gait carrrying 6lb weight in each hand w/therapist guarding R grip but pt did maintain grip w/task for functional challenge x 192ft w/cga, deviations as above but decreased episodes of toe catch.  Commode transfer w/cga.  Pt left on commode w/nurising notified and agreed to pull call  bell when finished.  PT session timed out.   Therapy Documentation Precautions:  Precautions Precautions: Fall Restrictions Weight Bearing Restrictions: No    Therapy/Group: Individual Therapy  Callie Fielding, PT   Jerrilyn Cairo 03/26/2020, 5:02 PM

## 2020-03-27 DIAGNOSIS — I69359 Hemiplegia and hemiparesis following cerebral infarction affecting unspecified side: Secondary | ICD-10-CM

## 2020-03-27 DIAGNOSIS — R7401 Elevation of levels of liver transaminase levels: Secondary | ICD-10-CM

## 2020-03-27 DIAGNOSIS — R030 Elevated blood-pressure reading, without diagnosis of hypertension: Secondary | ICD-10-CM

## 2020-03-27 NOTE — IPOC Note (Addendum)
Individualized overall Plan of Care (IPOC) Patient Details Name: Cody Henson MRN: [id number] DOB: 1975/08/01  Admitting Diagnosis: Left middle cerebral artery stroke [facility])  Hospital Problems: Principal Problem:   Left middle cerebral artery stroke ([place]) Active Problems:   Blood pressure increase diastolic   Transaminitis   Hemiparesis affecting dominant side as late effect of stroke ([place])     Functional Problem List: Nursing Behavior,Bladder,Safety,Motor  PT Balance,Endurance,Motor,Perception,Safety  OT Balance,Safety,Cognition,Sensory,Endurance,Motor  SLP Linguistic,Motor  TR         Basic ADL's: OT Grooming,Bathing,Dressing,Toileting     Advanced  ADL's: OT       Transfers: PT Bed Mobility,Bed to Chair,Car,Furniture  OT Toilet,Tub/Shower     Locomotion: PT Ambulation,Stairs     Additional Impairments: OT Fuctional Use of Upper Extremity  SLP None      TR      Anticipated Outcomes Item Anticipated Outcome  Self Feeding    Swallowing  N/A   Basic self-care  supervision  Toileting  supervision   Bathroom Transfers supervision  Bowel/Bladder  remain continent of bowel, resume bladder function  Transfers  Supervision  Locomotion  Supervision  Communication  mod I  Cognition  mod I  Pain  no pain or less than 4  Safety/Judgment  no skin breakdown, infection or falls   Therapy Plan: PT Intensity: Minimum of 1-2 x/day ,45 to 90 minutes PT Frequency: 5 out of 7 days PT Duration Estimated Length of Stay: 2.5-3 weeks OT Intensity: Minimum of 1-2 x/day, 45 to 90 minutes OT Frequency: 5 out of 7 days OT Duration/Estimated Length of Stay: 2.5 - 3 weeks SLP Intensity: Minumum of 1-2 x/day, 30 to 90 minutes SLP Frequency: 3 to 5 out of 7 days SLP Duration/Estimated Length of Stay: 2-3 weeks    Team Interventions: Nursing Interventions Patient/Family Education,Bladder Management,Disease Management/Prevention,Medication Management,Discharge  Planning,Psychosocial Support  PT interventions Ambulation/gait training,Community reintegration,DME/adaptive equipment instruction,Neuromuscular re-education,Psychosocial support,Stair training,UE/LE Strength taining/ROM,Balance/vestibular training,Functional electrical stimulation,Discharge planning,Therapeutic Activities,Skin care/wound management,Pain management,UE/LE Coordination activities,Cognitive remediation/compensation,Disease management/prevention,Functional mobility training,Patient/family education,Splinting/orthotics,Therapeutic Exercise,Visual/perceptual remediation/compensation  OT Interventions Balance/vestibular training,Discharge planning,Self Care/advanced ADL retraining,Therapeutic Activities,UE/LE Coordination activities,Functional electrical stimulation,Cognitive remediation/compensation,Disease mangement/prevention,Functional mobility training,Patient/family education,Therapeutic Exercise,DME/adaptive equipment instruction,Neuromuscular re-education,Psychosocial support,UE/LE Strength taining/ROM  SLP Interventions Cueing hierarchy,Functional tasks,Patient/family education,Speech/Language facilitation  TR Interventions    SW/CM Interventions Discharge Planning,Psychosocial Support,Patient/Family Education   Barriers to Discharge MD  Medical stability  Nursing Decreased caregiver support,Incontinence,Home environment access/layout,Lack of/limited family support,Medication compliance,Behavior    PT Home environment access/layout    OT      SLP      SW Decreased caregiver support,Lack of/limited family support     Team Discharge Planning: Destination: PT-Home ,OT- Home , SLP-Home Projected Follow-up: PT-24 hour supervision/assistance, OT-  24 hour supervision/assistance, SLP-Outpatient SLP Projected Equipment Needs: PT-To be determined, OT- To be determined, SLP-None recommended by SLP Equipment Details: PT- , OT-  Patient/family involved in discharge planning: PT-  Patient,Family member/caregiver,  OT-Patient, SLP-Patient,Family member/caregiver  MD ELOS: 14-16 days. Medical Rehab Prognosis:  Good Assessment: 45 year old right-handed male with history of depression maintained on Adderall as well as Zoloft.  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  He presented on 03/16/2018 with right hemiparesis, facial droop and aphasia. Admission chemistries unremarkable except potassium 3.4, glucose 116, WBCs 15,100, SARS coronavirus negative, urine drug screen positive marijuana. Cranial CT scan showed subtle evolving hypodensity involving the supra ganglionic mid posterior left frontal lobe concerning for acute left MCA distribution infarct. No hemorrhage noted.  CT angiogram head and  neck negative for large vessel occlusion.  No high-grade or correctable stenosis identified.  Patient did receive TPA.  MRI showed patchy multifocal acute ischemic infarct involving the left cerebral hemisphere.  Associated mild scattered petechial hemorrhage without frank hemorrhagic transformation.  No significant regional mass-effect. Echocardiogram ejection fraction of 60-65%. No wall motion abnormalities.  Bilateral lower extremity Dopplers negative.  TEE positive for PFO with bubbles crossing the atrial septum within 1-2 heartbeats.  No current plan for PFO closure follow-up per cardiology services.  Dr. Leonie Man recommends aspirin 81 mg daily and Plavix 75 mg daily for 3 weeks followed by  aspirin alone.  Subcutaneous Lovenox for DVT prophylaxis. Hospital course was complicated by bouts of urinary retention requiring I/O caths and has started on low dose Urecholine. Coagulopathy panel negative so far. He is tolerating a regular consistency diet.  Due to patient's right hemiparesis, dysarthria, aphasia he was admitted for a comprehensive rehab program. Will set goals for Supervision with PT/OT and Mod I with SLP.  Due to the current state of emergency, patients may not be  receiving their 3-hours of Medicare-mandated therapy.  See Team Conference Notes for weekly updates to the plan of care

## 2020-03-27 NOTE — Progress Notes (Signed)
[place] PHYSICAL MEDICINE & REHABILITATION PROGRESS NOTE   Subjective/Complaints: Patient seen sitting up in a chair this morning. He states he slept well overnight. He denies complaints.  ROS: Denies CP, shortness of breath, nausea, vomiting, diarrhea.  Objective:   No results found. No results for input(s): WBC, HGB, HCT, PLT in the last 72 hours. No results for input(s): NA, K, CL, CO2, GLUCOSE, BUN, CREATININE, CALCIUM in the last 72 hours.  Intake/Output Summary (Last 24 hours) at 03/27/2020 0927 Last data filed at 03/27/2020 9604 Gross per 24 hour  Intake 800 ml  Output --  Net 800 ml        Physical Exam: Vital Signs Blood pressure 126/86, pulse 60, temperature 98.1 F (36.7 C), resp. rate 14, height 5\' 8"  (1.727 m), weight 71.8 kg, SpO2 97 %. Constitutional: No distress . Vital signs reviewed. HENT: Normocephalic.  Atraumatic. Eyes: EOMI. No discharge. Cardiovascular: No JVD.  RRR. Respiratory: Normal effort.  No stridor.  Bilateral clear to auscultation. GI: Non-distended.  BS +. Skin: Warm and dry.   Bruising to right elbow Psych: Normal mood.  Normal behavior. Musc: No edema in extremities.  No tenderness in extremities. Neuro: Alert Expressive aphasia improving Motor: RUE: 4+/5 with apraxia proximal to distal, apraxia improving RLE: 4+/5 proximal to distal No increase in tone appreciated   Assessment/Plan: 1. Functional deficits which require 3+ hours per day of interdisciplinary therapy in a comprehensive inpatient rehab setting.  Physiatrist is providing close team supervision and 24 hour management of active medical problems listed below.  Physiatrist and rehab team continue to assess barriers to discharge/monitor patient progress toward functional and medical goals  Care Tool:  Bathing    Body parts bathed by patient: Right arm,Left arm,Chest,Abdomen,Front perineal area,Buttocks,Right upper leg,Left upper leg,Face,Left lower leg,Right lower leg          Bathing assist Assist Level: Minimal Assistance - Patient > 75%     Upper Body Dressing/Undressing Upper body dressing   What is the patient wearing?: Pull over shirt    Upper body assist Assist Level: Moderate Assistance - Patient 50 - 74%    Lower Body Dressing/Undressing Lower body dressing      What is the patient wearing?: Underwear/pull up,Pants     Lower body assist Assist for lower body dressing: Moderate Assistance - Patient 50 - 74%     Toileting Toileting    Toileting assist Assist for toileting: Set up assist Assistive Device Comment: urinal   Transfers Chair/bed transfer  Transfers assist     Chair/bed transfer assist level: Contact Guard/Touching assist     Locomotion Ambulation   Ambulation assist      Assist level: Minimal Assistance - Patient > 75% Assistive device: No Device Max distance: 350   Walk 10 feet activity   Assist     Assist level: Contact Guard/Touching assist Assistive device: No Device   Walk 50 feet activity   Assist    Assist level: Contact Guard/Touching assist Assistive device: No Device    Walk 150 feet activity   Assist    Assist level: Minimal Assistance - Patient > 75% Assistive device: No Device    Walk 10 feet on uneven surface  activity   Assist     Assist level: Moderate Assistance - Patient - 55 - 74%     Wheelchair     Assist Will patient use wheelchair at discharge?: No             Wheelchair  50 feet with 2 turns activity    Assist            Wheelchair 150 feet activity     Assist          Blood pressure 126/86, pulse 60, temperature 98.1 F (36.7 C), resp. rate 14, height 5\' 8"  (1.727 m), weight 71.8 kg, SpO2 97 %.  Medical Problem List and Plan: 1.  Right hemiparesis with aphasia secondary to acute ischemic infarct left MCA distribution.  Status post TPA  Continue CIR 2.  Antithrombotics: -DVT/anticoagulation: Lovenox              -antiplatelet therapy: Aspirin 81 mg daily and Plavix 75 mg daily x3 weeks and aspirin alone 3. Pain Management: Tylenol as needed 4. Mood: Zoloft 50 mg daily.  Provide emotional support             -antipsychotic agents: N/A 5. Neuropsych: This patient is ?fully capable of making decisions on his own behalf. 6. Skin/Wound Care: Routine skin checks 7. Fluids/Electrolytes/Nutrition:               -CMET WNL 8. PFO identified on TEE.  Follow-up cardiology services outpatient 9. Hypertension.  No antihypertensive medications prior to admission.    Diastolic pressures borderline elevated on 2/6 10. Urinary retention: continue voiding trial  -OOB to toilet/commode to empty              Continue low dose urecholine urecholine  Improved 11.  Dyslipidemia: Lipitor 12. Depression: On Zoloft 13. Transaminitis  LFTs elevated on 2/2, labs ordered for tomorrow    LOS: 4 days A FACE TO FACE EVALUATION WAS PERFORMED  Cody Henson Cody Henson 03/27/2020, 9:27 AM

## 2020-03-28 LAB — COMPREHENSIVE METABOLIC PANEL
ALT: 55 U/L — ABNORMAL HIGH (ref 0–44)
AST: 36 U/L (ref 15–41)
Albumin: 3.8 g/dL (ref 3.5–5.0)
Alkaline Phosphatase: 65 U/L (ref 38–126)
Anion gap: 11 (ref 5–15)
BUN: 15 mg/dL (ref 6–20)
CO2: 25 mmol/L (ref 22–32)
Calcium: 9.1 mg/dL (ref 8.9–10.3)
Chloride: 105 mmol/L (ref 98–111)
Creatinine, Ser: 1.04 mg/dL (ref 0.61–1.24)
GFR, Estimated: >60 mL/min (ref >60)
Glucose, Bld: 92 mg/dL (ref 70–99)
Potassium: 3.9 mmol/L (ref 3.5–5.1)
Sodium: 141 mmol/L (ref 135–145)
Total Bilirubin: 0.6 mg/dL (ref 0.3–1.2)
Total Protein: 6.3 g/dL — ABNORMAL LOW (ref 6.5–8.1)

## 2020-03-28 LAB — CBC
HCT: 41.1 % (ref 39.0–52.0)
Hemoglobin: 13.9 g/dL (ref 13.0–17.0)
MCH: 29.2 pg (ref 26.0–34.0)
MCHC: 33.8 g/dL (ref 30.0–36.0)
MCV: 86.3 fL (ref 80.0–100.0)
Platelets: 304 10*3/uL (ref 150–400)
RBC: 4.76 MIL/uL (ref 4.22–5.81)
RDW: 12 % (ref 11.5–15.5)
WBC: 8 10*3/uL (ref 4.0–10.5)
nRBC: 0 % (ref 0.0–0.2)

## 2020-03-28 MED ORDER — SALINE SPRAY 0.65 % NA SOLN
1.0000 | NASAL | Status: DC | PRN
  Filled 2020-03-28: qty 44

## 2020-03-28 NOTE — Progress Notes (Signed)
Physical Therapy Session Note  Patient Details  Name: Lenell Lama MRN: [id number] Date of Birth: 17-Nov-1975  Today's Date: 03/28/2020 PT Individual Time: 0930-1030 PT Individual Time Calculation (min): 60 min   Short Term Goals: Week 1:  PT Short Term Goal 1 (Week 1): Pt will perform bed to chair transfer with minA. PT Short Term Goal 2 (Week 1): Pt will ambulate 150' with minA and no AD. PT Short Term Goal 3 (Week 1): Pt will complete standing balance assessment.  Skilled Therapeutic Interventions/Progress Updates: Pt presents sitting in w/c and agreeable to therapy.  Pt wheeled to [place] for time and energy conservation.  Pt performed multiple trials to improve balance including stepping over weighted bars and on/over 3" stool, sidestepping and leaning and picking up weighted bars w/ BUES, amb w/ weighted bolster R and LUE, holding beach ball and bolster in bear hug and finally sit to stand transfers w/o UE assist 3 x 10.  Pt requires verbal cues for step length and occasional scuffing R toes but w/o LOB.  Pt amb multiple trials w/o AD and CGA, although fatigue noted w/ longer distances (up to 350'). Pt does attempt increased step length w/ a noted slap on R LE placement.  Pt returned to room and remained in w/c w/ chair alarm on and all needs in reach.     Therapy Documentation Precautions:  Precautions Precautions: Fall Restrictions Weight Bearing Restrictions: No General:   Vital Signs:  Pain: 0/10 w/o pain meds required. Pain Assessment Pain Scale: 0-10 Pain Score: 0-No pain Pain Type: Acute pain Pain Location: Head Mobility:       Therapy/Group: Individual Therapy  Ladoris Gene 03/28/2020, 12:30 PM

## 2020-03-28 NOTE — Progress Notes (Addendum)
[facility] PHYSICAL MEDICINE & REHABILITATION PROGRESS NOTE   Subjective/Complaints: Up cleaning up at sink. Had right sided nose bleed this morning which seems to have stopped. Had question about small ecchymosis on right shoulder  ROS: Patient denies fever, rash, sore throat, blurred vision, nausea, vomiting, diarrhea, cough, shortness of breath or chest pain, joint or back pain, headache, or mood change.    Objective:   No results found. Recent Labs    03/28/20 0600  WBC 8.0  HGB 13.9  HCT 41.1  PLT 304   Recent Labs    03/28/20 0600  NA 141  K 3.9  CL 105  CO2 25  GLUCOSE 92  BUN 15  CREATININE 1.04  CALCIUM 9.1    Intake/Output Summary (Last 24 hours) at 03/28/2020 1058 Last data filed at 03/28/2020 0830 Gross per 24 hour  Intake 860 ml  Output --  Net 860 ml        Physical Exam: Vital Signs Blood pressure (!) 127/91, pulse 64, temperature 98.1 F (36.7 C), resp. rate 14, height 5\' 8"  (1.727 m), weight 71.8 kg, SpO2 97 %. Constitutional: No distress . Vital signs reviewed. HEENT: EOMI, oral membranes moist Neck: supple Cardiovascular: RRR without murmur. No JVD    Respiratory/Chest: CTA Bilaterally without wheezes or rales. Normal effort    GI/Abdomen: BS +, non-tender, non-distended Ext: no clubbing, cyanosis, or edema Psych: pleasant and cooperative Skin: small ecchymosis on right supraclavicular area. Generalized bruising RUE.  Musc: No edema in extremities.  No tenderness in extremities. Neuro: Alert Expressive aphasia improving. Speech non-fluent but able to speak in phrases and short sentences.  Motor: RUE: 4+/5 with apraxia proximal to distal, apraxia better RLE: 4+/5 proximal to distal Nl resting tone   Assessment/Plan: 1. Functional deficits which require 3+ hours per day of interdisciplinary therapy in a comprehensive inpatient rehab setting.  Physiatrist is providing close team supervision and 24 hour management of active medical  problems listed below.  Physiatrist and rehab team continue to assess barriers to discharge/monitor patient progress toward functional and medical goals  Care Tool:  Bathing    Body parts bathed by patient: Right arm,Left arm,Chest,Abdomen,Front perineal area,Buttocks,Right upper leg,Left upper leg,Face,Left lower leg,Right lower leg         Bathing assist Assist Level: Minimal Assistance - Patient > 75%     Upper Body Dressing/Undressing Upper body dressing   What is the patient wearing?: Pull over shirt    Upper body assist Assist Level: Moderate Assistance - Patient 50 - 74%    Lower Body Dressing/Undressing Lower body dressing      What is the patient wearing?: Underwear/pull up,Pants     Lower body assist Assist for lower body dressing: Moderate Assistance - Patient 50 - 74%     Toileting Toileting    Toileting assist Assist for toileting: Set up assist Assistive Device Comment: urinal   Transfers Chair/bed transfer  Transfers assist     Chair/bed transfer assist level: Contact Guard/Touching assist     Locomotion Ambulation   Ambulation assist      Assist level: Minimal Assistance - Patient > 75% Assistive device: No Device Max distance: 350   Walk 10 feet activity   Assist     Assist level: Contact Guard/Touching assist Assistive device: No Device   Walk 50 feet activity   Assist    Assist level: Contact Guard/Touching assist Assistive device: No Device    Walk 150 feet activity   Assist  Assist level: Minimal Assistance - Patient > 75% Assistive device: No Device    Walk 10 feet on uneven surface  activity   Assist     Assist level: Moderate Assistance - Patient - 50 - 74%     Wheelchair     Assist Will patient use wheelchair at discharge?: No             Wheelchair 50 feet with 2 turns activity    Assist            Wheelchair 150 feet activity     Assist          Blood  pressure (!) 127/91, pulse 64, temperature 98.1 F (36.7 C), resp. rate 14, height 5\' 8"  (1.727 m), weight 71.8 kg, SpO2 97 %.  Medical Problem List and Plan: 1.  Right hemiparesis with aphasia secondary to acute ischemic infarct left MCA distribution.  Status post TPA  Continue CIR, team conference tomorrow. Doing quite well.  2.  Antithrombotics: -DVT/anticoagulation: Lovenox             -antiplatelet therapy: Aspirin 81 mg daily and Plavix 75 mg daily x3 weeks and aspirin alone  -may be someone who tends to bruise with this regimen  -hgb and plts normal 3. Pain Management: Tylenol as needed 4. Mood: Zoloft 50 mg daily.  Provide emotional support             -antipsychotic agents: N/A 5. Neuropsych: This patient is ?fully capable of making decisions on his own behalf. 6. Skin/Wound Care: Routine skin checks 7. Fluids/Electrolytes/Nutrition:               -I personally reviewed all of the patient's labs today, and lab work is within normal limits.   8. PFO identified on TEE.  Follow-up cardiology services outpatient 9. Hypertension.  No antihypertensive medications prior to admission.    Diastolic pressures borderline elevated on 2/6-7--no rx yet 10. Urinary retention: continue voiding trial  -OOB to toilet/commode to empty              Continue low dose urecholine urecholine  Emptying his bladder now 11.  Dyslipidemia: Lipitor 12. Depression: On Zoloft 13. Transaminitis  LFTs elevated on 2/2, further decrease today    LOS: 5 days A FACE TO FACE EVALUATION WAS PERFORMED  Meredith Staggers 03/28/2020, 10:58 AM

## 2020-03-28 NOTE — Progress Notes (Signed)
Speech Language Pathology Daily Session Note  Patient Details  Name: Justan Gaede MRN: [id number] Date of Birth: 1975/11/12  Today's Date: 03/28/2020 SLP Individual Time: 1430-1500 SLP Individual Time Calculation (min): 30 min  Short Term Goals: Week 1: SLP Short Term Goal 1 (Week 1): Patient will produce 3-4 syllable words/phrases with 80% accuracy and minA cues SLP Short Term Goal 2 (Week 1): Patient will describe object function at phrase level with 80% accuracy and modA cues. SLP Short Term Goal 3 (Week 1): Patient will perform oral reading at phrase level with 75% accuracy and mod A cues. SLP Short Term Goal 4 (Week 1): Patient will follow 2 step directions/pointing with modA cues.  Skilled Therapeutic Interventions: Skilled treatment session focused on communication goals. SLP facilitated session by providing extra time and overall supervision level verbal cues for patient to self-monitor and correct speech errors at the sentence level during a verbal description task. Patient required supervision level verbal cues to utilize strategies for production of multi-syllabic words with 80% accuracy. Patient left upright in the wheelchair with all needs within reach. Continue with current plan of care.      Pain No/Denies Pain   Therapy/Group: Individual Therapy  Nickie Warwick 03/28/2020, 3:02 PM

## 2020-03-28 NOTE — Progress Notes (Signed)
Occupational Therapy Session Note  Patient Details  Name: Cody Henson MRN: [id number] Date of Birth: 08/13/1975  Today's Date: 03/28/2020 OT Individual Time: 1100-1200 OT Individual Time Calculation (min): 60 min   Short Term Goals: Week 1:  OT Short Term Goal 1 (Week 1): Pt will complete LB dressing with CGA OT Short Term Goal 2 (Week 1): Pt will complete toileting with CGA OT Short Term Goal 3 (Week 1): Pt will complete toilet transfer with CGA OT Short Term Goal 4 (Week 1): Pt will complete LB bathing with CGA  Skilled Therapeutic Interventions/Progress Updates:    Pt greeted seated in wc and agreeable to OT treatment session. Pt wanted to shower today. Pt ambulated in room with min A and gait belt. Worked on integrating R UE to open drawers and collect clothing. Pt needed cues to use  R UE. Pt ambulated to bathroom and stood to void, but he was unable to void at this time. Pt undressed from tub bench, then completed bathing tasks with focus on use of R UE for neuro re-ed. Pt able to grasp wash cloth, but had difficulty approximating how hard he was washing. Dressing completed from wc at the sink with min verbal cues for technique. Pt brought to therapy gym and completed 5 mins forwards and 5 mins backwards on [employer] arm bike. Pt with increased difficulty getting into a rhythm going backwards. Pt needed facilitation to decreased shoulder hike when pulling backwards. Pt returned to room at end of session and completed stand-pivot back to bed with supervision. Pt left semi-reclined in bed with bed alarm on, call bell in reach, and needs met.   Therapy Documentation Precautions:  Precautions Precautions: Fall Restrictions Weight Bearing Restrictions: No ain: Pain Assessment Pain Scale: 0-10 Pain Score: 0-No pain Denies pain  Therapy/Group: Individual Therapy  Valma Cava 03/28/2020, 12:08 PM

## 2020-03-28 NOTE — Progress Notes (Incomplete)
Physical Therapy Session Note  Patient Details  Name: Cody Henson MRN: [id number] Date of Birth: October 06, 1975  Today's Date: 03/30/2020 PT Individual Time: 7829-5621 PT Individual Time Calculation (min): 72 min   Short Term Goals: Week 1:  PT Short Term Goal 1 (Week 1): Pt will perform bed to chair transfer with minA. PT Short Term Goal 2 (Week 1): Pt will ambulate 150' with minA and no AD. PT Short Term Goal 3 (Week 1): Pt will complete standing balance assessment.  Skilled Therapeutic Interventions/Progress Updates: Pt presents sitting in w/c, agreeable to therapy.  Pt states no pain.     Therapy Documentation Precautions:  Precautions Precautions: Fall Restrictions Weight Bearing Restrictions: No General:   Vital Signs: Therapy Vitals Temp: 98.1 F (36.7 C) Pulse Rate: 82 Resp: 19 BP: (!) 143/87 Patient Position (if appropriate): Sitting Oxygen Therapy SpO2: 96 % O2 Device: Room Air Pain: 0/10      Therapy/Group: Individual Therapy  Ladoris Gene 03/30/2020, 3:46 PM

## 2020-03-28 NOTE — Progress Notes (Signed)
Physical Therapy Session Note  Patient Details  Name: Cody Henson MRN: [id number] Date of Birth: [birth year]  Today's Date: 03/28/2020 PT Individual Time: 1515-1600 PT Individual Time Calculation (min): 45 min   Short Term Goals: Week 1:  PT Short Term Goal 1 (Week 1): Pt will perform bed to chair transfer with minA. PT Short Term Goal 2 (Week 1): Pt will ambulate 150' with minA and no AD. PT Short Term Goal 3 (Week 1): Pt will complete standing balance assessment.  Skilled Therapeutic Interventions/Progress Updates:    Patient in w/c in room and agreeable to PT.  Relates activities from PT earlier today and from Friday.  Patient sit to stand with CGA and ambulated with close S to CGA to therapy gym x 140'.  Patient negotiated 16 steps (has 16 to get to bedroom at home) with rails and CGA.  Patient with self selected sequence alternating to ascend and step to for descending with bilateral UE support.  Patient standing at step tapping L to second step x 10 for R weight shift, then alternating tapping to first step without UE support.  Patient negotiated floor ladder side stepping with CGA, then stepping forward inside square, then outside straddling square with min A.  Performed forward gait through ladders skipping a square, then stepping sideways diagonal stepping sideways and forward or back with mod cues and min A.  Patient in quadruped for diagonal weight shift mod cues and facilitation for core stability, R elbow extension and to reduce scapular winging.  Performed alternate arm lift forward then to side x 10 each, then in tall kneeling tapping targets forward, then raising arms overhead, then ball overhead x 10 then lateral to sides with trunk rotation with CGA.  Patient seated rest breaks between activities.  Performed braiding with mod A and cues, increased time.  Patient ambulated to room 170' with faciliation for trunk rotation and cues to reduce R foot slap.  Left in w/c with alarm active and  needs in reach.  Therapy Documentation Precautions:  Precautions Precautions: Fall Restrictions Weight Bearing Restrictions: No Pain: Pain Assessment Pain Score: 0-No pain    Therapy/Group: Individual Therapy  Reginia Naas  Agenda, PT 03/28/2020, 5:05 PM

## 2020-03-29 ENCOUNTER — Telehealth: Payer: Self-pay

## 2020-03-29 NOTE — Telephone Encounter (Signed)
Per Dr. Leonie Man, called to arrange PFO consult with Dr. Burt Knack.  Left message to call back.

## 2020-03-29 NOTE — Patient Care Conference (Signed)
Inpatient RehabilitationTeam Conference and Plan of Care Update Date: 03/29/2020   Time: 10:46 AM    Patient Name: Cody Henson      Medical Record Number: [id number]  Date of Birth: Jul 10, 1975 Sex: Male         Room/Bed: 4W17C/4W17C-01 Payor Info: Payor: [employer] / Plan: [employer] OTHER / Product Type: *No Product type* /    Admit Date/Time:  03/23/2020  5:04 PM  Primary Diagnosis:  Left middle cerebral artery stroke [facility])  Hospital Problems: Principal Problem:   Left middle cerebral artery stroke ([place]) Active Problems:   Blood pressure increase diastolic   Transaminitis   Hemiparesis affecting dominant side as late effect of stroke [facility])    Expected Discharge Date: Expected Discharge Date: 04/06/20  Team Members Present: Physician leading conference: Dr. Alger Simons Care Coodinator Present: Loralee Pacas, LCSWA;Aubryanna Nesheim Creig Hines, RN, BSN, [place] Nurse Present: Other (comment) Demetrios Loll, RN) PT Present: Magda Kiel, PT OT Present: Cherylynn Ridges, OT SLP Present: Weston Anna, SLP PPS Coordinator present : Gunnar Fusi, SLP     Current Status/Progress Goal Weekly Team Focus  Bowel/Bladder   pt cont of b and b lbm 03/27/20  remain cont of b and b  Asses q shift and prn   Swallow/Nutrition/ Hydration             ADL's   Min/CGA BADL tasks  Supervision  R UE NMR, balance, transfers, self-care retaining   Mobility   CGA/S for ambulation level surfaces R foot slap, CGA stairs with rails, S to CGA transfers, S bed mobility  S overall  coordination, sequencing and timing/grading/NMR with R side, core strength, safety   Communication   Supervision  Mod I  self-monitor and correct speech errors, improvement of verbal fluency   Safety/Cognition/ Behavioral Observations            Pain   pt has no c/o pain         Skin   pt has bruising on right elbow and red mole on right anterior neck. Pt concerned about these  prevent skin break down and infection   Assess q shift and prn     Discharge Planning:  D/c to home with his wife who will provide 24/7 care.   Team Discussion: No pain reported to MD. Medically stable, watching BP; diastolic elevated at times. Continent B/B, no pain reported. Bruises easily. Not working and is uninsured. Patient on target to meet rehab goals: RUE has no movement, working on balance, transfers, and self-care. Supervision goals. Contact guard to supervision with ambulation. Working on coordination, sequencing and timing. Supervision goals. Speech is working on self-monitoring and correction of speech errors. Mod I goals.   *See Care Plan and progress notes for long and short-term goals.   Revisions to Treatment Plan:  MD monitoring BP. Teaching Needs: Family education, medication management, skin/wound care, transfer training, gait training  Current Barriers to Discharge: Inaccessible home environment, Decreased caregiver support, Home enviroment access/layout, Wound care, Lack of/limited family support, Medication compliance and Behavior  Possible Resolutions to Barriers: Continue current medications, provide emotional support to patient and family.     Medical Summary Current Status: left MCA infarct with right hemiparesis. aphasia. BP elevated.  Barriers to Discharge: Medical stability   Possible Resolutions to [employer] Focus: bp mgt, daily assessment of labs/data.   Continued Need for Acute Rehabilitation Level of Care: The patient requires daily medical management by a physician with specialized training in physical medicine and rehabilitation  for the following reasons: Direction of a multidisciplinary physical rehabilitation program to maximize functional independence : Yes Medical management of patient stability for increased activity during participation in an intensive rehabilitation regime.: Yes Analysis of laboratory values and/or radiology reports with any subsequent need for medication  adjustment and/or medical intervention. : Yes   I attest that I was present, lead the team conference, and concur with the assessment and plan of the team.   Cristi Loron 03/29/2020, 3:45 PM

## 2020-03-29 NOTE — Progress Notes (Signed)
Patient ID: Cody Henson, male   DOB: 06/25/1975, 45 y.o.   MRN: [id number]  SW met with pt in room, and pt called his wife Cody Henson while SW in room. SW provided updates from team conference, and d/c date 2/16. Family education scheduled for Monday, 2/14 9am-12pm. Preferred outpatient location is  Outpatient. SW informed will send referral for outpatient PT/OT/SLP.  Loralee Pacas, MSW, [place] Office: [phone] Cell: [phone] Fax: [phone]

## 2020-03-29 NOTE — Progress Notes (Signed)
Physical Therapy Session Note  Patient Details  Name: Cody Henson MRN: [id number] Date of Birth: [birth year]  Today's Date: 03/29/2020 PT Individual Time: 1340-1420 PT Individual Time Calculation (min): 40 min   Short Term Goals: Week 1:  PT Short Term Goal 1 (Week 1): Pt will perform bed to chair transfer with minA. PT Short Term Goal 2 (Week 1): Pt will ambulate 150' with minA and no AD. PT Short Term Goal 3 (Week 1): Pt will complete standing balance assessment.  Skilled Therapeutic Interventions/Progress Updates:    Pt received sitting upright in w/c, awake and agreeable to therapy. No reports of pain. Sit<>stand with CGA and no AD and ambulated to main hallway with CGA, where focus of session on higher level gait training and RLE NMR, and cognitive dual-task. Gait training x99ft along main therapy hallway with CGA (intermittent minA with R foot catching and steadying during turns). Instructed him to perform serial casting by 2's from 100 as well as ascending order for months (Jan-Dec) and backwards order for months (Dec-Jan). Pt struggled with both of these tasks and had 75% accuracy with serial casting and 75% accuracy with months. Pt reporting mental fatigue after doing this. After brief seated rest break, he then completed lateral side lunges with CGA/minA along length of mat table, using mirror for visual feedback, focusing on sequencing of movement patterns and RLE strengthening. He required min cues for sequencing. Then completed modified push-ups on // bars, promoting RUE weight bearing for proprioceptive feedback. Completed session by perform lateral stepping L<>R over 1inch obstacles that were spaced 12inches apart, requiring CGA with no AD. Then placed colored circles along each of these barriers and performed dual-cog overlay with random number vs color command. Pt really struggled with alternating b/w color vs number but was accurate ~50% of the time. He then ambulated back to his room  with CGA, stand>sit with CGA to EOB, sit>supine indep without bed features. He ended session supine in bed with needs within reach, bed alarm on.  Therapy Documentation Precautions:  Precautions Precautions: Fall Restrictions Weight Bearing Restrictions: No General:    Therapy/Group: Individual Therapy  Lillionna Nabi P Stefen Juba PT 03/29/2020, 7:40 AM

## 2020-03-29 NOTE — Progress Notes (Signed)
Physical Therapy Session Note  Patient Details  Name: Cody Henson MRN: [id number] Date of Birth: 11-08-1975  Today's Date: 03/29/2020 PT Individual Time: 1530-1600 PT Individual Time Calculation (min): 30 min   Short Term Goals: Week 1:  PT Short Term Goal 1 (Week 1): Pt will perform bed to chair transfer with minA. PT Short Term Goal 2 (Week 1): Pt will ambulate 150' with minA and no AD. PT Short Term Goal 3 (Week 1): Pt will complete standing balance assessment.  Skilled Therapeutic Interventions/Progress Updates:    Patient in supine and agreeable to PT.  Reports he did not bring a coat to go outside, but agreeable to go to [facility] of [facility].  Supine to sit S.  Patient stand step to w/c with CGA.  Pushed in w/c to atrium.  Sit to stand with S.  Patient ambulated in atrium, through gift shop in busy environment with CGA for safety, but no episodes of R arm hitting shelves, etc as pt veers to L close to shelves avoiding getting near objects on R.  Noted R arm in flexed position throughout.  Had applied elbow protector initially in session.  Patient ambulated in open area of atrium x 250' weaving around tables and around desk at entrance without LOB.  Did note R knee with quick extension at mid stance and some tibial internal rotation.  Attempted to walk outside, but not warm enough with pt wearing shorts and short sleeves.  Patient preferred to ambulate to return to room so ambulated x 300' over tile and carpet and using elevator with cues for return to unit.  Patient sit to supine with S.  Left with bed alarm active and needs in reach.   Therapy Documentation Precautions:  Precautions Precautions: Fall Restrictions Weight Bearing Restrictions: No Pain: Pain Assessment Pain Score: 0-No pain    Therapy/Group: Individual Therapy  Reginia Naas  [place], [place] 03/29/2020, 5:21 PM

## 2020-03-29 NOTE — Progress Notes (Signed)
Speech Language Pathology Daily Session Note  Patient Details  Name: Cody Henson MRN: [id number] Date of Birth: [birth year]  Today's Date: 03/29/2020 SLP Individual Time: 1100-1130 SLP Individual Time Calculation (min): 30 min  Short Term Goals: Week 1: SLP Short Term Goal 1 (Week 1): Patient will produce 3-4 syllable words/phrases with 80% accuracy and minA cues SLP Short Term Goal 2 (Week 1): Patient will describe object function at phrase level with 80% accuracy and modA cues. SLP Short Term Goal 3 (Week 1): Patient will perform oral reading at phrase level with 75% accuracy and mod A cues. SLP Short Term Goal 4 (Week 1): Patient will follow 2 step directions/pointing with modA cues.  Skilled Therapeutic Interventions: Skilled treatment session focused on speech goals. SLP facilitated session by providing extra time for patient to self-monitor and correct speech errors while verbalizing minimal pairs. Patient also required extra time and supervision level phonemic cues to self-monitor and correct speech errors at the sentence level during a structured reading task. Patient's overall fluency appears improved during informal conversation at the conversation level. Patient left upright in the wheelchair with alarm on and all needs within reach. Continue with current plan of care.      Pain No/Denies Pain   Therapy/Group: Individual Therapy  Cody Henson 03/29/2020, 12:32 PM

## 2020-03-29 NOTE — Progress Notes (Signed)
Occupational Therapy Session Note  Patient Details  Name: Cody Henson MRN: [id number] Date of Birth: 06-30-1975  Today's Date: 03/29/2020 OT Individual Time: 2297-9892 OT Individual Time Calculation (min): 60 min   Short Term Goals: Week 1:  OT Short Term Goal 1 (Week 1): Pt will complete LB dressing with CGA OT Short Term Goal 2 (Week 1): Pt will complete toileting with CGA OT Short Term Goal 3 (Week 1): Pt will complete toilet transfer with CGA OT Short Term Goal 4 (Week 1): Pt will complete LB bathing with CGA  Skilled Therapeutic Interventions/Progress Updates:    Pt greeted sitting in wc and agreeable to OT treatment session. Pt had already gotten clothes out to wear today. Pt ambulated into bathroom without AD and min A. Pt was able to doff clothing seated on tub bench with verbal cues to integrate R UE. Pt needed verbal cues throughout BADLs to use R U. Pt often over reaching and applying to much pressure with grasp. Forced use of R UE to help pull up pants and min cues for hemi techniques. Pt brought to therapy gym and worked on trunk control and R UE NMR in quadruped position. Had pt focus on weight bearing through R UE while placing graded pegs on rod, then alternate and use R UE to remove pegs. Facilitation and guided A to decrease shoulder hike and facilitate functional pinch. Had pt crawl forwards and backwards on therapy mat for neuro re-ed and coordination. Pt returned to room and left seated in wc with chair alarm on, call bell in reach, and needs met.   Therapy Documentation Precautions:  Precautions Precautions: Fall Restrictions Weight Bearing Restrictions: No Pain: Pain Assessment Pain Scale: 0-10 Pain Score: 0-No pain  Therapy/Group: Individual Therapy  Valma Cava 03/29/2020, 8:46 AM

## 2020-03-29 NOTE — Progress Notes (Signed)
[facility] PHYSICAL MEDICINE & REHABILITATION PROGRESS NOTE   Subjective/Complaints: Up in w/c. No new issues. Comfortable. No further nose bleeding. Didn't get saline gtts  ROS: Patient denies fever, rash, sore throat, blurred vision, nausea, vomiting, diarrhea, cough, shortness of breath or chest pain, joint or back pain, headache, or mood change.     Objective:   No results found. Recent Labs    03/28/20 0600  WBC 8.0  HGB 13.9  HCT 41.1  PLT 304   Recent Labs    03/28/20 0600  NA 141  K 3.9  CL 105  CO2 25  GLUCOSE 92  BUN 15  CREATININE 1.04  CALCIUM 9.1    Intake/Output Summary (Last 24 hours) at 03/29/2020 1200 Last data filed at 03/29/2020 0745 Gross per 24 hour  Intake 656 ml  Output --  Net 656 ml        Physical Exam: Vital Signs Blood pressure (!) 135/102, pulse 61, temperature 97.6 F (36.4 C), temperature source Oral, resp. rate 16, height 5\' 8"  (1.727 m), weight 71.8 kg, SpO2 98 %. Constitutional: No distress . Vital signs reviewed. HEENT: EOMI, oral membranes moist Neck: supple Cardiovascular: RRR without murmur. No JVD    Respiratory/Chest: CTA Bilaterally without wheezes or rales. Normal effort    GI/Abdomen: BS +, non-tender, non-distended Ext: no clubbing, cyanosis, or edema Psych: pleasant and cooperative Skin: small ecchymosis on right supraclavicular area. Generalized bruising RUE.--no change  Musc: No edema in extremities.  No tenderness in extremities. Neuro: Alert Expressive aphasia/apraxia improving.  Motor: RUE: 4+/5 with apraxia proximal to distal RLE: 4+/5 proximal to distal Nl resting tone   Assessment/Plan: 1. Functional deficits which require 3+ hours per day of interdisciplinary therapy in a comprehensive inpatient rehab setting.  Physiatrist is providing close team supervision and 24 hour management of active medical problems listed below.  Physiatrist and rehab team continue to assess barriers to discharge/monitor  patient progress toward functional and medical goals  Care Tool:  Bathing    Body parts bathed by patient: Right arm,Left arm,Chest,Abdomen,Front perineal area,Buttocks,Right upper leg,Left upper leg,Face,Left lower leg,Right lower leg         Bathing assist Assist Level: Minimal Assistance - Patient > 75%     Upper Body Dressing/Undressing Upper body dressing   What is the patient wearing?: Pull over shirt    Upper body assist Assist Level: Moderate Assistance - Patient 50 - 74%    Lower Body Dressing/Undressing Lower body dressing      What is the patient wearing?: Underwear/pull up,Pants     Lower body assist Assist for lower body dressing: Moderate Assistance - Patient 50 - 74%     Toileting Toileting    Toileting assist Assist for toileting: Set up assist Assistive Device Comment: urinal   Transfers Chair/bed transfer  Transfers assist     Chair/bed transfer assist level: Contact Guard/Touching assist     Locomotion Ambulation   Ambulation assist      Assist level: Contact Guard/Touching assist Assistive device: No Device Max distance: 170   Walk 10 feet activity   Assist     Assist level: Contact Guard/Touching assist Assistive device: No Device   Walk 50 feet activity   Assist    Assist level: Contact Guard/Touching assist Assistive device: No Device    Walk 150 feet activity   Assist    Assist level: Contact Guard/Touching assist Assistive device: No Device    Walk 10 feet on uneven surface  activity  Assist     Assist level: Moderate Assistance - Patient - 50 - 74%     Wheelchair     Assist Will patient use wheelchair at discharge?: No             Wheelchair 50 feet with 2 turns activity    Assist            Wheelchair 150 feet activity     Assist          Blood pressure (!) 135/102, pulse 61, temperature 97.6 F (36.4 C), temperature source Oral, resp. rate 16, height 5\' 8"   (1.727 m), weight 71.8 kg, SpO2 98 %.  Medical Problem List and Plan: 1.  Right hemiparesis with aphasia secondary to acute ischemic infarct left MCA distribution.  Status post TPA  Continue CIR, ELOS 2/16--outpt therapy f/u  2.  Antithrombotics: -DVT/anticoagulation: Lovenox             -antiplatelet therapy: Aspirin 81 mg daily and Plavix 75 mg daily x3 weeks and aspirin alone  -may be someone who tends to bruise with this regimen  -hgb and plts normal 3. Pain Management: Tylenol as needed 4. Mood: Zoloft 50 mg daily.  Provide emotional support             -antipsychotic agents: N/A  -on adderall pta  5. Neuropsych: This patient is ?fully capable of making decisions on his own behalf. 6. Skin/Wound Care: Routine skin checks 7. Fluids/Electrolytes/Nutrition:               -recent labs ok.   8. PFO identified on TEE.  Follow-up with Dr. Burt Knack as outpatient 9. Hypertension.  No antihypertensive medications prior to admission.    Diastolic pressures borderline elevated on 2/6-7--no rx yet 10. Urinary retention: continue voiding trial  -OOB to toilet/commode to empty              Continue low dose urecholine urecholine  Emptying his bladder now 11.  Dyslipidemia: Lipitor 12. Depression: On Zoloft 13. Transaminitis  LFTs elevated on 2/2, further decrease today    LOS: 6 days A FACE TO [place] 03/29/2020, 12:00 PM

## 2020-03-30 MED ORDER — LISINOPRIL 5 MG PO TABS
5.0000 mg | ORAL_TABLET | Freq: Every day | ORAL | Status: DC
  Administered 2020-03-30 – 2020-04-01 (×3): 5 mg via ORAL
  Filled 2020-03-30 (×2): qty 1

## 2020-03-30 NOTE — Progress Notes (Signed)
Occupational Therapy Session Note  Patient Details  Name: Cody Henson MRN: [id number] Date of Birth: 1975/08/28  Today's Date: 03/30/2020 OT Individual Time: 8377-9396 OT Individual Time Calculation (min): 58 min    Short Term Goals: Week 1:  OT Short Term Goal 1 (Week 1): Pt will complete LB dressing with CGA OT Short Term Goal 2 (Week 1): Pt will complete toileting with CGA OT Short Term Goal 3 (Week 1): Pt will complete toilet transfer with CGA OT Short Term Goal 4 (Week 1): Pt will complete LB bathing with CGA  Skilled Therapeutic Interventions/Progress Updates:     Pt received in q/c with clothing selected wanting to shower ADL:  Pt completes bathing with supervision with VC for use of RRUE functionally when washing hair and reaching for washcloths Pt completes UB dressing with S Pt completes LB dressing with CGA sit to stand with Pt recalling hemi strategies without VC Pt completes footwear with foced use of RUE to retrieve off floor and hold tongue while pulling shoe onto foot  Pt completes shower/Tub transfer with CGA walking into bathroom gathering clothing into/out of shower from floor to shoulder height with no LOB noted. VC for R arm swing  Therapeutic activity Pt participates in supine and seated yoga activity focused on mindfulness, sensation, WB, R attention, RUE AROM and stretching. Demo and skilled cuing provided for divided attention, decreasing compensatory strategies and tactile cues for R activation. Pt completes overhead reading, bridging, supine twist, glute stretch, seated thread the needle, seated pushups.   Pt left at end of session in w/c with exit alarm on, call light in reach and all needs met   Therapy Documentation Precautions:  Precautions Precautions: Fall Restrictions Weight Bearing Restrictions: No General:   Vital Signs: Therapy Vitals Temp: 98.7 F (37.1 C) Temp Source: Oral Pulse Rate: 69 Resp: 18 BP: (!) 121/93 Oxygen  Therapy SpO2: 97 % Pain:   ADL: ADL Grooming: Setup Where Assessed-Grooming: Sitting at sink Upper Body Bathing: Supervision/safety Where Assessed-Upper Body Bathing: Sitting at sink Lower Body Bathing: Minimal assistance Where Assessed-Lower Body Bathing: Standing at sink,Sitting at sink Upper Body Dressing: Moderate assistance Where Assessed-Upper Body Dressing: Sitting at sink Lower Body Dressing: Moderate assistance Where Assessed-Lower Body Dressing: Sitting at sink,Standing at sink Toileting: Minimal assistance Where Assessed-Toileting: Glass blower/designer: Moderate assistance Toilet Transfer Method: Stand pivot Vision   Perception    Praxis   Exercises:   Other Treatments:     Therapy/Group: Individual Therapy  Tonny Branch 03/30/2020, 6:50 AM

## 2020-03-30 NOTE — Consult Note (Signed)
Neuropsychological Consultation   Patient:   Cody Henson   DOB:   02/08/1976  MR Number:  [id number]  Location:  [facility] [id number] [place] [address] Dept: [place]: [phone]           Date of Service:   03/29/2020  Start Time:   4 PM End Time:   5 PM  Provider/Observer:  Ilean Skill, Psy.D.       Clinical Neuropsychologist       Billing Code/Service: [phone]  Chief Complaint:    Cody Henson is a 45 year old male with history of depression that has been maintained by psychiatry on Adderall as well as Zoloft.  Patient presented on 03/16/2020 with right hemiparesis, facial droop and complete aphasia.  CT scan showed subtle evolving hypodensity involving the supra ganglionic mid posterior left frontal lobe with concerns for acute left MCA distribution infarct.  No hemorrhage noted.  Patient did receive TPA.  MRI showed patchy multifocal acute ischemic infarcts involving the left cerebral hemispheres with associated mild scattered petechial hemorrhage without frank hemorrhagic transfusion.  Patient with continued but improving expressive aphasia and continued right hemiparesis that is also improving.  Reason for Service:  Patient was referred for neuropsychological setting of the HPI for the consultation.  Below see HPI for the current admission.  HPI: Cody Henson is a 45 year old right-handed male with history of depression maintained on Adderall as well as Zoloft. History from chart review due to aphasia. Patient lives with spouse and 27-year-old child. Wife is expecting in June. Two-level home 2 steps to entry.  Independent prior to admission.  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  He presented on 03/16/2018 with right hemiparesis, facial droop and aphasia. Admission chemistries unremarkable except potassium 3.4, glucose 116, WBCs 15,100, SARS coronavirus negative,  urine drug screen positive marijuana. Cranial CT scan showed subtle evolving hypodensity involving the supra ganglionic mid posterior left frontal lobe concerning for acute left MCA distribution infarct. No hemorrhage noted.  CT angiogram head and neck negative for large vessel occlusion.  No high-grade or correctable stenosis identified.  Patient did receive TPA.  MRI showed patchy multifocal acute ischemic infarct involving the left cerebral hemisphere.  Associated mild scattered petechial hemorrhage without frank hemorrhagic transformation.  No significant regional mass-effect.   Echocardiogram ejection fraction of 60-65%. No wall motion abnormalities.  Bilateral lower extremity Dopplers negative.  TEE positive for PFO with bubbles crossing the atrial septum within 1-2 heartbeats.  No current plan for PFO closure follow-up per cardiology services.  Dr. Leonie Man recommends aspirin 81 mg daily and Plavix 75 mg daily for 3 weeks followed by  aspirin alone.  Subcutaneous Lovenox for DVT prophylaxis. Hospital course was complicated by bouts of urinary retention requiring I/O caths and has started on low dose Urecholine. Coagulopathy panel negative so far. He is tolerating a regular consistency diet.  Due to patient's right hemiparesis, dysarthria, aphasia he was admitted for a comprehensive rehab program. Please see preadmission assessment from earlier today as well.  Current Status:  Upon entering the room, the patient was alert and oriented and sitting up in his bed in the inclined position with TV off.  Patient appeared to have good cognition and mental status and excellent receptive language abilities with ongoing expressive language deficits noted.  Patient was able to string 3 and 4 word sentences together but clearly had word finding issues and articulation  deficits.  Patient was clearly indicating he was well aware of the concepts and issues that he wanted to say.  Patient was oriented and good cognition  otherwise.  Patient denied a significant worsening of his depression but acknowledged having some significant psychosocial stressors including around his job status that had had a change immediately before his stroke.  Patient with questions about etiological factors including identified heart valve issue.  Patient initiated call with his wife on speaker phone during much of the visit today and she also had questions about interventional cardiology going forward and others that were beyond my purview of expertise.  The patient is showing progressive and steady improvements which are likely to continue going forward.  Patient remains motivated and is fully participating in therapeutic efforts.  Behavioral Observation: Cody Henson  presents as a 45 y.o.-year-old Right Caucasian Male who appeared his stated age. his dress was Appropriate and he was Well Groomed and his manners were Appropriate to the situation.  his participation was indicative of Appropriate and Attentive behaviors.  There were physical disabilities noted.  he displayed an appropriate level of cooperation and motivation.     Interactions:    Active Appropriate and Attentive  Attention:   within normal limits and attention span and concentration were age appropriate  Memory:   within normal limits; recent and remote memory intact  Visuo-spatial:  not examined  Speech (Volume):  normal  Speech:   non-fluent aphasia;   Thought Process:  Coherent and Relevant  Though Content:  WNL; not suicidal and not homicidal  Orientation:   person, place, time/date and situation  Judgment:   Good  Planning:   Good  Affect:    Appropriate  Mood:    Dysphoric  Insight:   Good  Intelligence:   very high  Medical History:   Past Medical History:  Diagnosis Date  . Depressed          Patient Active Problem List   Diagnosis Date Noted  . Transaminitis   . Hemiparesis affecting dominant side as late effect of stroke ([facility])   .  Blood pressure increase diastolic   . Left middle cerebral artery stroke ([facility]) 03/23/2020  . Urinary retention   . Dyslipidemia   . Benign essential HTN   . Acute ischemic stroke ([facility])   . Global aphasia   . Depression   . Hyperglycemia   . Right hemiparesis ([facility])   . Aphasia   . Elevated blood pressure reading   . Stroke (cerebrum) ([facility]) 03/17/2020     Psychiatric History:  Patient has a prior psychiatric history of depression that has been well managed with Zoloft and Adderall.  Patient denies significant worsening of his depressive symptoms and he does appear to be coping and managing from a psychological/emotional standpoint with appropriate response.  Family Med/Psych History:  Family History  Problem Relation Age of Onset  . Stroke Paternal Grandfather   . Stroke Maternal Aunt    Impression/DX:  Cody Henson is a 45 year old male with history of depression that has been maintained by psychiatry on Adderall as well as Zoloft.  Patient presented on 03/16/2020 with right hemiparesis, facial droop and complete aphasia.  CT scan showed subtle evolving hypodensity involving the supra ganglionic mid posterior left frontal lobe with concerns for acute left MCA distribution infarct.  No hemorrhage noted.  Patient did receive TPA.  MRI showed patchy multifocal acute ischemic infarcts involving the left cerebral hemispheres with associated mild scattered petechial hemorrhage without  frank hemorrhagic transfusion.  Patient with continued but improving expressive aphasia and continued right hemiparesis that is also improving.  Upon entering the room, the patient was alert and oriented and sitting up in his bed in the inclined position with TV off.  Patient appeared to have good cognition and mental status and excellent receptive language abilities with ongoing expressive language deficits noted.  Patient was able to string 3 and 4 word sentences together but clearly had word finding issues and  articulation deficits.  Patient was clearly indicating he was well aware of the concepts and issues that he wanted to say.  Patient was oriented and good cognition otherwise.  Patient denied a significant worsening of his depression but acknowledged having some significant psychosocial stressors including around his job status that had had a change immediately before his stroke.  Patient with questions about etiological factors including identified heart valve issue.  Patient initiated call with his wife on speaker phone during much of the visit today and she also had questions about interventional cardiology going forward and others that were beyond my purview of expertise.  The patient is showing progressive and steady improvements which are likely to continue going forward.  Patient remains motivated and is fully participating in therapeutic efforts.  Disposition/Plan:  The patient will be discharged soon but if possible I will follow-up with him and it is likely that there will be a need to follow-up for testing and other issues post discharge around issues related to driving and return to work questions.  Diagnosis:    Left middle cerebral artery stroke ([facility]) - Plan: Ambulatory referral to Speech Therapy, Ambulatory referral to Occupational Therapy, Ambulatory referral to Physical Therapy, Ambulatory referral to Physical Medicine Rehab  Hemiparesis affecting dominant side as late effect of stroke [facility]) - Plan: Ambulatory referral to Speech Therapy, Ambulatory referral to Occupational Therapy, Ambulatory referral to Physical Therapy, Ambulatory referral to Physical Medicine Rehab         Electronically Signed   _______________________ Ilean Skill, Psy.D. Clinical Neuropsychologist

## 2020-03-30 NOTE — Progress Notes (Signed)
Patient ID: Cody Henson, male   DOB: 20-Jun-1975, 45 y.o.   MRN: [id number]  SW faxed outpatient PT/OT/SLP referral to [facility] ([phone]/f:[phone]).  Loralee Pacas, MSW, [place] Office: [phone] Cell: [phone] Fax: [phone]

## 2020-03-30 NOTE — Progress Notes (Signed)
Physical Therapy Session Note  Patient Details  Name: Cody Henson MRN: [id number] Date of Birth: Nov 11, 1975  Today's Date: 03/30/2020 PT Individual Time: 2841-3244 PT Individual Time Calculation (min): 72 min   Short Term Goals: Week 1:  PT Short Term Goal 1 (Week 1): Pt will perform bed to chair transfer with minA. PT Short Term Goal 2 (Week 1): Pt will ambulate 150' with minA and no AD. PT Short Term Goal 3 (Week 1): Pt will complete standing balance assessment.  Skilled Therapeutic Interventions/Progress Updates: Pt presents sitting in w/c playing game w/ spouse.  Pt agreeable to participate in therapy.  Pt wheeled self to gym using B LE reciprocal pattern.  Pt performed standing toe-taps to 4" platform, overhead press w/ yellow beach ball unilateral stance on 4" platform, sit to stand w/o UE support and then w/ continued overhead press w/ beach ball 3 x 10 each exercise, negotiating through "train tracks" forward sidestepping and backwards x 2, marching and quick stop/starts as well as visual scanning side to side while amb.  Pt had minimal LOB during trials of train tracks.  Noted decrease in toe slap of R foot w/ gait although increased as fatigues.  Pt amb multiple trials w/o AD up to 300' w/ turns and change in directions.  Pt negotiated 12 steps w/ L railing and  CGA, step-through gait pattern, verbal cues for advancing L hand on railing.  Pt required occsional rest breaks 2/2 fatigue.  Pt returned to room and into BR for continence of urine and supervision for clothing management.  Pt amb to bed and transferred to supine w/ supervision.  Bed alarm on and all needs in reach.     Therapy Documentation Precautions:  Precautions Precautions: Fall Restrictions Weight Bearing Restrictions: No General:   Vital Signs: Therapy Vitals Temp: 98.1 F (36.7 C) Pulse Rate: 82 Resp: 19 BP: (!) 143/87 Patient Position (if appropriate): Sitting Oxygen Therapy SpO2: 96 % O2 Device: Room  Air Pain: no c/o pain       Therapy/Group: Individual Therapy  Ladoris Gene 03/30/2020, 3:02 PM

## 2020-03-30 NOTE — Progress Notes (Signed)
[facility] PHYSICAL MEDICINE & REHABILITATION PROGRESS NOTE   Subjective/Complaints: Up in bed eating breakfast. No new complaints. Slept well. Happy with progress and dc date  ROS: Patient denies fever, rash, sore throat, blurred vision, nausea, vomiting, diarrhea, cough, shortness of breath or chest pain, joint or back pain, headache, or mood change.   Objective:   No results found. Recent Labs    03/28/20 0600  WBC 8.0  HGB 13.9  HCT 41.1  PLT 304   Recent Labs    03/28/20 0600  NA 141  K 3.9  CL 105  CO2 25  GLUCOSE 92  BUN 15  CREATININE 1.04  CALCIUM 9.1    Intake/Output Summary (Last 24 hours) at 03/30/2020 0841 Last data filed at 03/29/2020 1300 Gross per 24 hour  Intake 200 ml  Output --  Net 200 ml        Physical Exam: Vital Signs Blood pressure (!) 121/93, pulse 69, temperature 98.7 F (37.1 C), temperature source Oral, resp. rate 18, height 5\' 8"  (1.727 m), weight 71.8 kg, SpO2 97 %. Constitutional: No distress . Vital signs reviewed. HEENT: EOMI, oral membranes moist Neck: supple Cardiovascular: RRR without murmur. No JVD    Respiratory/Chest: CTA Bilaterally without wheezes or rales. Normal effort    GI/Abdomen: BS +, non-tender, non-distended Ext: no clubbing, cyanosis, or edema Psych: pleasant and cooperative Skin: small ecchymosis on right supraclavicular area. Generalized bruising RUE.--all areas not substantially changed.  Musc: No edema in extremities.  No tenderness in extremities. Neuro: Alert Expressive aphasia/apraxia present but better. .  Motor: RUE: 4+/5 with apraxia proximal to distal RLE: 4+/5 proximal to distal Nl muscle tone   Assessment/Plan: 1. Functional deficits which require 3+ hours per day of interdisciplinary therapy in a comprehensive inpatient rehab setting.  Physiatrist is providing close team supervision and 24 hour management of active medical problems listed below.  Physiatrist and rehab team continue to  assess barriers to discharge/monitor patient progress toward functional and medical goals  Care Tool:  Bathing    Body parts bathed by patient: Right arm,Left arm,Chest,Abdomen,Front perineal area,Buttocks,Right upper leg,Left upper leg,Face,Left lower leg,Right lower leg         Bathing assist Assist Level: Minimal Assistance - Patient > 75%     Upper Body Dressing/Undressing Upper body dressing   What is the patient wearing?: Pull over shirt    Upper body assist Assist Level: Moderate Assistance - Patient 50 - 74%    Lower Body Dressing/Undressing Lower body dressing      What is the patient wearing?: Underwear/pull up,Pants     Lower body assist Assist for lower body dressing: Moderate Assistance - Patient 50 - 74%     Toileting Toileting    Toileting assist Assist for toileting: Set up assist Assistive Device Comment: urinal   Transfers Chair/bed transfer  Transfers assist     Chair/bed transfer assist level: Contact Guard/Touching assist     Locomotion Ambulation   Ambulation assist      Assist level: Contact Guard/Touching assist Assistive device: No Device Max distance: 300'   Walk 10 feet activity   Assist     Assist level: Contact Guard/Touching assist Assistive device: No Device   Walk 50 feet activity   Assist    Assist level: Contact Guard/Touching assist Assistive device: No Device    Walk 150 feet activity   Assist    Assist level: Contact Guard/Touching assist Assistive device: No Device    Walk 10 feet  on uneven surface  activity   Assist     Assist level: Moderate Assistance - Patient - 50 - 74%     Wheelchair     Assist Will patient use wheelchair at discharge?: No             Wheelchair 50 feet with 2 turns activity    Assist            Wheelchair 150 feet activity     Assist          Blood pressure (!) 121/93, pulse 69, temperature 98.7 F (37.1 C), temperature source  Oral, resp. rate 18, height 5\' 8"  (1.727 m), weight 71.8 kg, SpO2 97 %.  Medical Problem List and Plan: 1.  Right hemiparesis with aphasia secondary to acute ischemic infarct left MCA distribution.  Status post TPA  Continue CIR, ELOS 2/16--outpt therapy f/u  2.  Antithrombotics: -DVT/anticoagulation: Lovenox             -antiplatelet therapy: Aspirin 81 mg daily and Plavix 75 mg daily x3 weeks and aspirin alone  -may be someone who tends to bruise with this regimen  -hgb and plts normal 3. Pain Management: Tylenol as needed 4. Mood: Zoloft 50 mg daily.  Provide emotional support             -antipsychotic agents: N/A  -on adderall pta  5. Neuropsych: This patient is ?fully capable of making decisions on his own behalf. 6. Skin/Wound Care: Routine skin checks 7. Fluids/Electrolytes/Nutrition:               -recent labs ok.   8. PFO identified on TEE.  Follow-up with Dr. Burt Knack as outpatient 9. Hypertension.  No antihypertensive medications prior to admission.    Diastolic pressures remains elevated   -begin lisinopril 5 mg daily 2/9 10. Urinary retention: continue voiding trial  -OOB to toilet/commode to empty              Continue low dose urecholine urecholine  Emptying his bladder now 11.  Dyslipidemia: Lipitor 12. Depression: On Zoloft 13. Transaminitis  LFTs now almost within normal range    LOS: 7 days A FACE TO FACE EVALUATION WAS PERFORMED  Meredith Staggers 03/30/2020, 8:41 AM

## 2020-03-30 NOTE — Progress Notes (Signed)
Speech Language Pathology Daily Session Note  Patient Details  Name: Tekoa Amon MRN: [id number] Date of Birth: 28-Jan-1976  Today's Date: 03/30/2020 SLP Individual Time: 9509-3267 SLP Individual Time Calculation (min): 40 min  Short Term Goals: Week 1: SLP Short Term Goal 1 (Week 1): Patient will produce 3-4 syllable words/phrases with 80% accuracy and minA cues SLP Short Term Goal 2 (Week 1): Patient will describe object function at phrase level with 80% accuracy and modA cues. SLP Short Term Goal 3 (Week 1): Patient will perform oral reading at phrase level with 75% accuracy and mod A cues. SLP Short Term Goal 4 (Week 1): Patient will follow 2 step directions/pointing with modA cues.  Skilled Therapeutic Interventions: Skilled treatment session focused on speech goals. SLP facilitated session by providing extra time and overall supervision level verbal cues to self-monitor and correct speech errors while reading complex multi-syllabic words aloud. Patient also read aloud at the paragraph level with supervision verbal cues to self-correct errors. Increased cueing was needed while patient was reading a children's book aloud (practiing reading to daughter). Suspect increased difficulty was due to the formatting of the text. Patient left upright in wheelchair with all needs within reach. Continue with current plan of care.      Pain No/Denies Pain   Therapy/Group: Individual Therapy  Clara Herbison 03/30/2020, 12:52 PM

## 2020-03-31 NOTE — Progress Notes (Signed)
Occupational Therapy Weekly Progress Note  Patient Details  Name: Cody Henson MRN: [id number] Date of Birth: Nov 03, 1975  Beginning of progress report period: March 24, 2020 End of progress report period: March 31, 2020  Today's Date: 03/31/2020  Session 1 OT Individual Time: 1000-1100 OT Individual Time Calculation (min): 60 min   Session 2 OT Individual Time: 6301-6010 OT Individual Time Calculation (min): 50 min    Patient has met 4 of 4 short term goals.  Pt is making steady progress towards OT goals. Pt is at an overall CGA level for BADL tasks. Pt continues to have some ataxia and apraxia of R side, but it is improving. Continue current POC.  Patient continues to demonstrate the following deficits: muscle weakness, impaired timing and sequencing, abnormal tone, unbalanced muscle activation, motor apraxia, ataxia, decreased coordination and decreased motor planning, decreased attention to right and decreased motor planning, decreased problem solving and decreased sitting balance, decreased standing balance, decreased postural control, hemiplegia and decreased balance strategies and therefore will continue to benefit from skilled OT intervention to enhance overall performance with BADL.  Patient progressing toward long term goals..  Continue plan of care.  OT Short Term Goals Week 1:  OT Short Term Goal 1 (Week 1): Pt will complete LB dressing with CGA OT Short Term Goal 1 - Progress (Week 1): Met OT Short Term Goal 2 (Week 1): Pt will complete toileting with CGA OT Short Term Goal 2 - Progress (Week 1): Met OT Short Term Goal 3 (Week 1): Pt will complete toilet transfer with CGA OT Short Term Goal 3 - Progress (Week 1): Met OT Short Term Goal 4 (Week 1): Pt will complete LB bathing with CGA OT Short Term Goal 4 - Progress (Week 1): Met Week 2:  OT Short Term Goal 1 (Week 2): LTG=STG 2/2 ELOS  Skilled Therapeutic Interventions/Progress Updates:  Session 1   Pt greeted  seated in wc and agreeable to OT treatment session. Pt had already gotten new clothes out. Pt ambulated into bathroom with CGA, and sat on tub bench to doff clothing. Pt bathed with min verbal cues to integrate R UE. Pt then completed dressing tasks with overall CGA for balance when pulling up pants and min verbal cues for forced use of R UE. Pt brought to therapy gym and worked on functional use of R UE with large peg board task. Focus on functional pinch and grading grasp. Continued working on R UE with visual motor activity of tracing on BITS system. Focus on finger isolation, wrist extension, and normal movement patterns. Pt returned to room and left seated in wc with chair alarm on, call bell in reach, and needs met.   Session 2 Pt greeted seated in wc and agreeable to OT treatment session. Pt brought to therapy gym in wc. Worked on R fine motor control with small beads and yellow theraputty. Pt unable to grasp small beads 2/2 grippijng too hard. OT graded task with larger square beads and pt was able to pick up larger beads with better grading of his grasp. Focus on functional pinch using only theraputty. Standing R UE there-ex using 2lb free weight. 3 sets of 10 bicep curl, straight arm raise, and side raise. Pt returned to room and ambulated into bathroom w/ CGA. Pt stood to void, but was unable to go. Pt ambulated back out of bathroom, wash hands at the sink, then returned to bed with CGA.  Pt left semi-reclined in bed with bed alarm on,  call bell in reach, and needs met.   Therapy Documentation Precautions:  Precautions Precautions: Fall Restrictions Weight Bearing Restrictions: No Pain:  Denies pain   Therapy/Group: Individual Therapy  Valma Cava 03/31/2020, 3:21 PM

## 2020-03-31 NOTE — Progress Notes (Signed)
[place] PHYSICAL MEDICINE & REHABILITATION PROGRESS NOTE   Subjective/Complaints: No new issues. Up early with therapy  ROS: Patient denies fever, rash, sore throat, blurred vision, nausea, vomiting, diarrhea, cough, shortness of breath or chest pain, joint or back pain, headache, or mood change.    Objective:   No results found. No results for input(s): WBC, HGB, HCT, PLT in the last 72 hours. No results for input(s): NA, K, CL, CO2, GLUCOSE, BUN, CREATININE, CALCIUM in the last 72 hours.  Intake/Output Summary (Last 24 hours) at 03/31/2020 1030 Last data filed at 03/31/2020 0811 Gross per 24 hour  Intake 900 ml  Output --  Net 900 ml        Physical Exam: Vital Signs Blood pressure (!) 147/98, pulse 60, temperature 98.3 F (36.8 C), resp. rate 16, height 5\' 8"  (1.727 m), weight 71.8 kg, SpO2 97 %. Constitutional: No distress . Vital signs reviewed. HEENT: EOMI, oral membranes moist Neck: supple Cardiovascular: RRR without murmur. No JVD    Respiratory/Chest: CTA Bilaterally without wheezes or rales. Normal effort    GI/Abdomen: BS +, non-tender, non-distended Ext: no clubbing, cyanosis, or edema Psych: pleasant and cooperative Skin: small ecchymosis on right supraclavicular area. Generalized bruising RUE.--all areas not substantially changed.  Musc: No edema in extremities.  No tenderness in extremities. Neuro: Alert Aphasic, apraxic.  Motor: RUE: 4+/5 with apraxia proximal to distal RLE: 4+/5 proximal to distal Nl muscle tone   Assessment/Plan: 1. Functional deficits which require 3+ hours per day of interdisciplinary therapy in a comprehensive inpatient rehab setting.  Physiatrist is providing close team supervision and 24 hour management of active medical problems listed below.  Physiatrist and rehab team continue to assess barriers to discharge/monitor patient progress toward functional and medical goals  Care Tool:  Bathing    Body parts bathed by  patient: Right arm,Left arm,Chest,Abdomen,Front perineal area,Buttocks,Right upper leg,Left upper leg,Face,Left lower leg,Right lower leg         Bathing assist Assist Level: Minimal Assistance - Patient > 75%     Upper Body Dressing/Undressing Upper body dressing   What is the patient wearing?: Pull over shirt    Upper body assist Assist Level: Moderate Assistance - Patient 50 - 74%    Lower Body Dressing/Undressing Lower body dressing      What is the patient wearing?: Underwear/pull up,Pants     Lower body assist Assist for lower body dressing: Moderate Assistance - Patient 50 - 74%     Toileting Toileting    Toileting assist Assist for toileting: Set up assist Assistive Device Comment: urinal   Transfers Chair/bed transfer  Transfers assist     Chair/bed transfer assist level: Contact Guard/Touching assist     Locomotion Ambulation   Ambulation assist      Assist level: Contact Guard/Touching assist Assistive device: No Device Max distance: 300'   Walk 10 feet activity   Assist     Assist level: Contact Guard/Touching assist Assistive device: No Device   Walk 50 feet activity   Assist    Assist level: Contact Guard/Touching assist Assistive device: No Device    Walk 150 feet activity   Assist    Assist level: Contact Guard/Touching assist Assistive device: No Device    Walk 10 feet on uneven surface  activity   Assist     Assist level: Moderate Assistance - Patient - 50 - 74%     Wheelchair     Assist Will patient use wheelchair at discharge?: No  Type of Wheelchair: Manual    Wheelchair assist level: Supervision/Verbal cueing Max wheelchair distance: 100    Wheelchair 50 feet with 2 turns activity    Assist        Assist Level: Supervision/Verbal cueing   Wheelchair 150 feet activity     Assist          Blood pressure (!) 147/98, pulse 60, temperature 98.3 F (36.8 C), resp. rate 16, height  5\' 8"  (1.727 m), weight 71.8 kg, SpO2 97 %.  Medical Problem List and Plan: 1.  Right hemiparesis with aphasia secondary to acute ischemic infarct left MCA distribution.  Status post TPA  Continue CIR, ELOS 2/16--outpt therapy f/u  2.  Antithrombotics: -DVT/anticoagulation: Lovenox             -antiplatelet therapy: Aspirin 81 mg daily and Plavix 75 mg daily x3 weeks and aspirin alone  -may be someone who tends to bruise with this regimen  -hgb and plts normal 3. Pain Management: Tylenol as needed 4. Mood: Zoloft 50 mg daily.  Provide emotional support             -antipsychotic agents: N/A  -on adderall pta  5. Neuropsych: This patient is ?fully capable of making decisions on his own behalf. 6. Skin/Wound Care: Routine skin checks 7. Fluids/Electrolytes/Nutrition:               -recent labs ok.   8. PFO identified on TEE.  Follow-up with Dr. Burt Knack as outpatient 9. Hypertension.  No antihypertensive medications prior to admission.    Diastolic pressures remains elevated   -began lisinopril 5 mg daily 2/9---obsv 2/10 10. Urinary retention: continue voiding trial  -OOB to toilet/commode to empty              Continue low dose urecholine urecholine  Emptying his bladder now 11.  Dyslipidemia: Lipitor 12. Depression: On Zoloft 13. Transaminitis  LFTs now almost within normal range    LOS: 8 days A FACE TO FACE EVALUATION WAS PERFORMED  Meredith Staggers 03/31/2020, 10:30 AM

## 2020-03-31 NOTE — Progress Notes (Signed)
Physical Therapy Weekly Progress Note  Patient Details  Name: Cody Henson MRN: [id number] Date of Birth: 01/10/1976  Beginning of progress report period: March 24, 2020 End of progress report period: March 31, 2020  Today's Date: 03/31/2020 PT Individual Time: 0802-0859 PT Individual Time Calculation (min): 57 min   Patient has met 3 of 3 short term goals. Pt is progressing very well toward mobility goals, improving independence in bed mobility, balance, transfers, and ambulation. Pt is consistently performing all mobility at supervision level, but with ongoing deficits in R hemibody motor planning, coordination, and sensation, affecting safety with transfers and ambulation and putting pt at high risk for falls. Focus of coming week to be NMR of R hemibody, ambulation without AD, multitasking, and DC prep.  Patient continues to demonstrate the following deficits muscle weakness, decreased cardiorespiratoy endurance, impaired timing and sequencing, unbalanced muscle activation, motor apraxia, decreased coordination and decreased motor planning, decreased problem solving and decreased memory and decreased standing balance, hemiplegia and decreased balance strategies and therefore will continue to benefit from skilled PT intervention to increase functional independence with mobility.  Patient progressing toward long term goals..  Continue plan of care.  PT Short Term Goals Week 1:  PT Short Term Goal 1 (Week 1): Pt will perform bed to chair transfer with minA. PT Short Term Goal 1 - Progress (Week 1): Met PT Short Term Goal 2 (Week 1): Pt will ambulate 150' with minA and no AD. PT Short Term Goal 2 - Progress (Week 1): Met PT Short Term Goal 3 (Week 1): Pt will complete standing balance assessment. PT Short Term Goal 3 - Progress (Week 1): Met Week 2:  PT Short Term Goal 1 (Week 2): LTGs = STGs  Skilled Therapeutic Interventions/Progress Updates:     Pt recewived seated in WC and agrees  to therapy. No complaint of pain. Pt self propels WC to gym using bilateral lower extremities and cus for symmetrical reciprocal pattern. Upon arrival to gym pt reports cramping in R hamstrings. PT provides rest break to manage pain and provides pt with water to alleviate cramping. Pt performs multiple reps of sit to stand during session with cues on positioning. Pt ambulates x100' with supervision and cues to increase eccentric control of plantar flexion during early stance phase. PT places 4lb ankle weight on R lower extremity for subsequent bouts to provide incrased somatosensory and proprioceptive input, with pt demonstrating improved pattern. Pt ambulates additional bouts of x100' carrying slideboard with 3 cups balanced on surface to challenge multitasking ability as well as coordination with R hemibody. Pt able to complete x3 bouts without "spilling cups". Pt then performs stair training. Pt complete multiple bouts of x4 steps with supervision and cues on step sequencing and hand placement. Initially pt uses bilateral hand rails but progresses to only using L hand rail, to simulate home environment. Pt performs "step-ups" 2x15, stepping up onto 6" step with R leg and then tapping L toe on 2nd step to illicit hip extension in R side, and then controlling lowering of L leg using eccentric control of R quad muscle group. Pt takes multiple seated rest breaks during activity. Pt left seated in WC with alarm intact and all needs within reach.  Therapy Documentation Precautions:  Precautions Precautions: Fall Restrictions Weight Bearing Restrictions: No   Therapy/Group: Individual Therapy  William C Allen, PT, DPT 03/31/2020, 4:10 PM  

## 2020-03-31 NOTE — Plan of Care (Signed)
  Problem: Consults Goal: RH STROKE PATIENT EDUCATION Description: See Patient Education module for education specifics  Outcome: Progressing   Problem: RH BLADDER ELIMINATION Goal: RH STG MANAGE BLADDER WITH ASSISTANCE Description: STG Manage Bladder With Assistance, MOD I Outcome: Progressing   Problem: RH COGNITION-NURSING Goal: RH STG USES MEMORY AIDS/STRATEGIES W/ASSIST TO PROBLEM SOLVE Description: STG Uses Memory Aids/Strategies With Assistance to Problem Solve. Min assist Outcome: Progressing   Problem: RH BLADDER ELIMINATION Goal: RH STG MANAGE BLADDER WITH ASSISTANCE Description: STG Manage Bladder With mod I Assistance Outcome: Progressing   Problem: RH SKIN INTEGRITY Goal: RH STG SKIN FREE OF INFECTION/BREAKDOWN Outcome: Progressing   Problem: RH SAFETY Goal: RH STG ADHERE TO SAFETY PRECAUTIONS W/ASSISTANCE/DEVICE Description: STG Adhere to Safety Precautions With mod I Assistance/Device. Outcome: Progressing   Problem: RH PAIN MANAGEMENT Goal: RH STG PAIN MANAGED AT OR BELOW PT'S PAIN GOAL Description: Pain scale <2/10 Outcome: Progressing   Problem: RH KNOWLEDGE DEFICIT Goal: RH STG INCREASE KNOWLEDGE OF HYPERTENSION Outcome: Progressing Goal: RH STG INCREASE KNOWLEDGE OF STROKE PROPHYLAXIS Outcome: Progressing

## 2020-03-31 NOTE — Progress Notes (Signed)
Nutrition Follow-up  DOCUMENTATION CODES:   Not applicable  INTERVENTION:   - Continue Ensure Enlive po BID, each supplement provides 350 kcal and 20 grams of protein  - Continue MVI with minerals daily  NUTRITION DIAGNOSIS:   Increased nutrient needs related to other (therapies) as evidenced by estimated needs.  Ongoing  GOAL:   Patient will meet greater than or equal to 90% of their needs  Progressing  MONITOR:   PO intake,Supplement acceptance,Weight trends  REASON FOR ASSESSMENT:   Consult Assessment of nutrition requirement/status  ASSESSMENT:   45 year old male with PMH of depression. Pt presented on 03/16/20 with right hemiparesis, facial droop, and aphasia. Pt found to have acute ischemic infarct left MCA distribution and did receive TPA. Admitted to CIR on 03/23/20.  Spoke with pt at bedside. Pt reports good appetite and good PO intake. 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 This has caused him to be limited in what items he can order off of the menu, but pt has been able to work around these issues. Pt states that he has been consuming the Ensure supplements. Per [facility] documentation, pt accepting ~75% of Ensure supplements when offered.  Pt reports UBW of 155-165 lbs. He does not think that he has lost weight recently and reports that he may have gained some weight recently.  RD encouraged continued adequate PO intake.  Meal Completion: 95-100%  Medications reviewed and include: Ensure Enlive BID, MVI with minerals, protonix  Labs reviewed.  NUTRITION - FOCUSED PHYSICAL EXAM:  Flowsheet Row Most Recent Value  Orbital Region No depletion  Upper Arm Region Mild depletion  Thoracic and Lumbar Region No depletion  Buccal Region No depletion  Temple Region No depletion  Clavicle Bone Region Mild depletion  Clavicle and Acromion Bone Region Mild depletion  Scapular Bone Region No  depletion  Dorsal Hand No depletion  Patellar Region Mild depletion  Anterior Thigh Region Mild depletion  Posterior Calf Region Mild depletion  Edema (RD Assessment) None  Hair Reviewed  Eyes Reviewed  Mouth Reviewed  Skin Reviewed  Nails Reviewed       Diet Order:   Diet Order            Diet regular Room service appropriate? Yes; Fluid consistency: Thin  Diet effective now                 EDUCATION NEEDS:   Education needs have been addressed  Skin:  Skin Assessment: Reviewed RN Assessment  Last BM:  03/30/20  Height:   Ht Readings from Last 1 Encounters:  03/23/20 5\' 8"  (1.727 m)    Weight:   Wt Readings from Last 1 Encounters:  03/23/20 71.8 kg    BMI:  Body mass index is 24.07 kg/m.  Estimated Nutritional Needs:   Kcal:  2200-2400  Protein:  100-115 grams  Fluid:  >/= 2.0 L    Gustavus Bryant, MS, RD, LDN Inpatient Clinical Dietitian Please see AMi[facility] for contact information.

## 2020-03-31 NOTE — Progress Notes (Signed)
Speech Language Pathology Weekly Progress and Session Note  Patient Details  Name: Cody Henson MRN: [id number] Date of Birth: 1975/06/14  Beginning of progress report period: March 23, 2020 End of progress report period: March 31, 2020  Today's Date: 03/31/2020 SLP Individual Time: 2446-2863 SLP Individual Time Calculation (min): 45 min  Short Term Goals: Week 1: SLP Short Term Goal 1 (Week 1): Patient will produce 3-4 syllable words/phrases with 80% accuracy and minA cues SLP Short Term Goal 1 - Progress (Week 1): Met SLP Short Term Goal 2 (Week 1): Patient will describe object function at phrase level with 80% accuracy and modA cues. SLP Short Term Goal 2 - Progress (Week 1): Met SLP Short Term Goal 3 (Week 1): Patient will perform oral reading at phrase level with 75% accuracy and mod A cues. SLP Short Term Goal 3 - Progress (Week 1): Met SLP Short Term Goal 4 (Week 1): Patient will follow 2 step directions/pointing with modA cues. SLP Short Term Goal 4 - Progress (Week 1): Met    New Short Term Goals: Week 2: SLP Short Term Goal 1 (Week 2): STGs=LTGs due to ELOS  Weekly Progress Updates: Patient has made excellent gains and has met 4 of 4 STGs this reporting period. Currently, patient requires extra time and overall supervision level phonemic and verbal cues to self-monitor and correct speech errors at the sentence level. Due to improvements in overall coordination and speech production, patient demonstrates improved verbal fluency. Patient and family education ongoing. Patient would benefit from continued skilled SLP intervention to maximize his speech production and overall functional communication prior to discharge.     Intensity: Minumum of 1-2 x/day, 30 to 90 minutes Frequency: 3 to 5 out of 7 days Duration/Length of Stay: 2/16 Treatment/Interventions: Cueing hierarchy;Functional tasks;Patient/family education;Speech/Language facilitation;Environmental  controls;Therapeutic Activities;Internal/external aids   Daily Session  Skilled Therapeutic Interventions: Skilled treatment session focused on cognitive and speech goals. SLP facilitated session by providing supervision level verbal cues to follow single step directions with two object manipulation. However, cueing increased to overall Min verbal cues with extra time and repetition for 2 step directions with multiple object manipulation. However, suspect difficulty following multi-step directions is a combination of apraxia and processing speed. Patient may also demonstrate decreased working memory with verbal information but can interdependently recall functional, daily information.  Patient appeared to increase recall and accuracy of multi-step commands when steps were broken up by the word "then" ( raise your left hand then touch your chin). Patient left upright in the wheelchair with alarm on and all needs within reach. Continue with current plan of care.    Pain No/Denies Pain   Therapy/Group: Individual Therapy  Mamoudou Mulvehill 03/31/2020, 6:43 AM

## 2020-04-01 MED ORDER — LISINOPRIL 10 MG PO TABS
10.0000 mg | ORAL_TABLET | Freq: Every day | ORAL | Status: DC
  Administered 2020-04-02 – 2020-04-06 (×5): 10 mg via ORAL
  Filled 2020-04-01 (×5): qty 1

## 2020-04-01 NOTE — Telephone Encounter (Signed)
Wife of patient returning Katy's call to schedule appointment with Dr. Burt Knack

## 2020-04-01 NOTE — Progress Notes (Signed)
Physical Therapy Session Note  Patient Details  Name: Cody Henson MRN: [id number] Date of Birth: [birth year]  Today's Date: 04/01/2020 PT Individual Time: 9518-8416 and 6063-0160 PT Individual Time Calculation (min): 58 min and 59 min  Short Term Goals: Week 2:  PT Short Term Goal 1 (Week 2): LTGs = STGs  Skilled Therapeutic Interventions/Progress Updates:     1st Session: Pt received seated in WC and agrees to therapy. No complaint of pain. WC transport to gym for time management. Pt performs NMR in quadruped position to promote WB through R upper extremity and R lower extremity. Pt able to "crawl" into quadruped position with cues on sequencing. Pt then performs color and pattern sequencing task with L hand while PT facilitates R upper extremity engagement and extension.   Pt performs NMR for standing balance and R lower extremity, standing and tapping left toe to colored/numbered circles. Pt begins tapping x1 at a time and progresses to tapping x4 in succession. Pt able to perform x3 in sequencing >75% of time. Pt able to tap x4 in correct sequencing ~25% of time. Pt does not require manual assistance for balance, but frequent verbal cues to remember desired sequence.   Pt returns to quadruped and performs alternating reaching forward with L upper extremity and lifting R and L lower extremities. PT provides tactile cueing for body mechanics and to facilitate optimal WB through R hemibody. Pt then transitions to high kneeling and holds onto exercise ball with bilateral upper extremities, performing x20 lateral rotations, diagonal rotations in PNF pattern, vertical lifts. Performs for core engagement as well as multiplane use of R upper extremity.  Pt left seated in WC with alarm intact and all needs within reach.  2nd Session: Pt received seated in [facility] and agrees to therapy. No complaint of pain. WC transport outside for gait training over varying surfaces and unlevel ground. Pt ambulates 300'x2  with close supervision. Pt demos mild ataxia in R lower extremity during first bout. For 2nd bout pt uses 5lb ankle weight on R leg and demos improved coordination and eccentric control of plantarflexion during early stance phase. Pt then ambulates up/down ramps, x5 steps with R hand rail, and over uneven terrain including grass and curbs, performing with verbal cues for upright gaze to improve posture and balance, and increase trunk rotation and arm swing. x500' total. Pt then ambulates additional 300' without R ankle weight, demonstrating some carryover but with ongoing mild ataxia.   Pt performs NMR for standing balance and R upper extremity with Wii bowling game. Pt has difficulty with fine motor skills, but improves performance when using visual feedback and isolating tasks. No assistance required for balance but max verbal and tactile cues for use of Wii controller.   Pt left seated in WC with alarm intact and all needs within reach.    Therapy Documentation Precautions:  Precautions Precautions: Fall Restrictions Weight Bearing Restrictions: No   Therapy/Group: Individual Therapy  Breck Coons, PT, DPT 04/01/2020, 4:17 PM

## 2020-04-01 NOTE — Progress Notes (Signed)
Occupational Therapy Session Note  Patient Details  Name: Cody Henson MRN: [id number] Date of Birth: [birth year]  Today's Date: 04/01/2020  Session 1 OT Individual Time: 8185-9093 OT Individual Time Calculation (min): 56 min   Session 2 OT Individual Time: 1500-1530 OT Individual Time Calculation (min): 30 min    Short Term Goals: Week 2:  OT Short Term Goal 1 (Week 2): LTG=STG 2/2 ELOS  Skilled Therapeutic Interventions/Progress Updates:    Session 1 Pt greeted seated in wc and agreeable to OT treatment session. Pt ambulated to bathroom with close supervision, sat on tub bench to doff clothing with supervision. Bathing tasks completed with supervision and min verbal cues to integrate R UE into bathing tasks. Dressing completed with increased time to orient clothing and dress R side. Pt brought down to therapy apartment and discussed home bathroom set-up. Practiced walk-in shower and sitting on shower seat. Pt plans to purchase shower seat privately through [place]. Worked on R UE fine motor control with focus on finger isolation and normal movement patterns with tracing and replication activity on BITS. Pt returned to room and left seated in wc with chair alarm on, call bell in reach, and needs met.   Session 2 Pt greeted seated in wc and agreeable to OT treatment session.  Pt brought down to therapy gym and completed 5 minutes forward and 5 minutes backwards on UE Ergometer on level 4 forwards, and level 3 backwards. Pt with more difficulty pulling handles backwards. Pt then completed 3 sets of 10 chest press using 5 lb dowel rod with mirror feedback to increase body awareness and body mechanics. Pt returned to room and ambulated back to bed with close supervision. Pt left semi-reclined in bed with bed alarm on, call bell in reach, and needs met.    Therapy Documentation Precautions:  Precautions Precautions: Fall Restrictions Weight Bearing Restrictions: No Pain:  Denies  pain  Therapy/Group: Individual Therapy  Valma Cava 04/01/2020, 3:40 PM

## 2020-04-01 NOTE — Progress Notes (Signed)
[facility] PHYSICAL MEDICINE & REHABILITATION PROGRESS NOTE   Subjective/Complaints: No new complaints. Up in the shower cleaning up when I entered initially  ROS: Patient denies fever, rash, sore throat, blurred vision, nausea, vomiting, diarrhea, cough, shortness of breath or chest pain, joint or back pain, headache, or mood change.   Objective:   No results found. No results for input(s): WBC, HGB, HCT, PLT in the last 72 hours. No results for input(s): NA, K, CL, CO2, GLUCOSE, BUN, CREATININE, CALCIUM in the last 72 hours.  Intake/Output Summary (Last 24 hours) at 04/01/2020 1226 Last data filed at 04/01/2020 0700 Gross per 24 hour  Intake 840 ml  Output --  Net 840 ml        Physical Exam: Vital Signs Blood pressure (!) 127/95, pulse 62, temperature 97.8 F (36.6 C), temperature source Oral, resp. rate 17, height 5\' 8"  (1.727 m), weight 71.8 kg, SpO2 97 %. Constitutional: No distress . Vital signs reviewed. HEENT: EOMI, oral membranes moist Neck: supple Cardiovascular: RRR without murmur. No JVD    Respiratory/Chest: CTA Bilaterally without wheezes or rales. Normal effort    GI/Abdomen: BS +, non-tender, non-distended Ext: no clubbing, cyanosis, or edema Psych: pleasant and cooperative Skin: small ecchymosis on right supraclavicular area. Generalized bruising RUE.--all areas not substantially changed.  Musc: No edema in extremities.  No tenderness in extremities. Neuro: Alert Aphasic, apraxic.  Motor: RUE: 4+/5, apraixc RLE: 4+/5 proximal to distal Nl muscle tone   Assessment/Plan: 1. Functional deficits which require 3+ hours per day of interdisciplinary therapy in a comprehensive inpatient rehab setting.  Physiatrist is providing close team supervision and 24 hour management of active medical problems listed below.  Physiatrist and rehab team continue to assess barriers to discharge/monitor patient progress toward functional and medical goals  Care  Tool:  Bathing    Body parts bathed by patient: Right arm,Left arm,Chest,Abdomen,Front perineal area,Buttocks,Right upper leg,Left upper leg,Face,Left lower leg,Right lower leg         Bathing assist Assist Level: Minimal Assistance - Patient > 75%     Upper Body Dressing/Undressing Upper body dressing   What is the patient wearing?: Pull over shirt    Upper body assist Assist Level: Moderate Assistance - Patient 50 - 74%    Lower Body Dressing/Undressing Lower body dressing      What is the patient wearing?: Underwear/pull up,Pants     Lower body assist Assist for lower body dressing: Moderate Assistance - Patient 50 - 74%     Toileting Toileting    Toileting assist Assist for toileting: Set up assist Assistive Device Comment: urinal   Transfers Chair/bed transfer  Transfers assist     Chair/bed transfer assist level: Contact Guard/Touching assist     Locomotion Ambulation   Ambulation assist      Assist level: Supervision/Verbal cueing Assistive device: No Device Max distance: 150'   Walk 10 feet activity   Assist     Assist level: Supervision/Verbal cueing Assistive device: No Device   Walk 50 feet activity   Assist    Assist level: Supervision/Verbal cueing Assistive device: No Device    Walk 150 feet activity   Assist    Assist level: Supervision/Verbal cueing Assistive device: No Device    Walk 10 feet on uneven surface  activity   Assist     Assist level: Moderate Assistance - Patient - 50 - 74%     Wheelchair     Assist Will patient use wheelchair at discharge?: No  Type of Wheelchair: Manual    Wheelchair assist level: Supervision/Verbal cueing Max wheelchair distance: 100    Wheelchair 50 feet with 2 turns activity    Assist        Assist Level: Supervision/Verbal cueing   Wheelchair 150 feet activity     Assist          Blood pressure (!) 127/95, pulse 62, temperature 97.8 F (36.6  C), temperature source Oral, resp. rate 17, height 5\' 8"  (1.727 m), weight 71.8 kg, SpO2 97 %.  Medical Problem List and Plan: 1.  Right hemiparesis with aphasia secondary to acute ischemic infarct left MCA distribution.  Status post TPA  Continue CIR, ELOS 2/16--outpt therapy f/u  2.  Antithrombotics: -DVT/anticoagulation: Lovenox             -antiplatelet therapy: Aspirin 81 mg daily and Plavix 75 mg daily x3 weeks and aspirin alone  -may be someone who tends to bruise with this regimen  -hgb and plts normal 3. Pain Management: Tylenol as needed 4. Mood: Zoloft 50 mg daily.  Provide emotional support             -antipsychotic agents: N/A  -on adderall pta  5. Neuropsych: This patient is ?fully capable of making decisions on his own behalf. 6. Skin/Wound Care: Routine skin checks 7. Fluids/Electrolytes/Nutrition:               -recent labs ok.   8. PFO identified on TEE.  Follow-up with Dr. Burt Knack as outpatient 9. Hypertension.  No antihypertensive medications prior to admission.    Diastolic pressures remains elevated 2/11   -increase lisinopril to 10mg  10. Urinary retention: continue voiding trial  -OOB to toilet/commode to empty              Continue low dose urecholine urecholine  Emptying his bladder now 11.  Dyslipidemia: Lipitor 12. Depression: On Zoloft 13. Transaminitis  LFTs now almost within normal range    LOS: 9 days A FACE TO FACE EVALUATION WAS PERFORMED  Meredith Staggers 04/01/2020, 12:26 PM

## 2020-04-01 NOTE — Plan of Care (Signed)
  Problem: Consults Goal: RH STROKE PATIENT EDUCATION Description: See Patient Education module for education specifics  Outcome: Progressing   Problem: RH BLADDER ELIMINATION Goal: RH STG MANAGE BLADDER WITH ASSISTANCE Description: STG Manage Bladder With Assistance, MOD I Outcome: Progressing   Problem: RH COGNITION-NURSING Goal: RH STG USES MEMORY AIDS/STRATEGIES W/ASSIST TO PROBLEM SOLVE Description: STG Uses Memory Aids/Strategies With Assistance to Problem Solve. Min assist Outcome: Progressing   Problem: RH BLADDER ELIMINATION Goal: RH STG MANAGE BLADDER WITH ASSISTANCE Description: STG Manage Bladder With mod I Assistance Outcome: Progressing   Problem: RH SKIN INTEGRITY Goal: RH STG SKIN FREE OF INFECTION/BREAKDOWN Outcome: Progressing   Problem: RH SAFETY Goal: RH STG ADHERE TO SAFETY PRECAUTIONS W/ASSISTANCE/DEVICE Description: STG Adhere to Safety Precautions With mod I Assistance/Device. Outcome: Progressing   Problem: RH PAIN MANAGEMENT Goal: RH STG PAIN MANAGED AT OR BELOW PT'S PAIN GOAL Description: Pain scale <2/10 Outcome: Progressing   Problem: RH KNOWLEDGE DEFICIT Goal: RH STG INCREASE KNOWLEDGE OF HYPERTENSION Outcome: Progressing Goal: RH STG INCREASE KNOWLEDGE OF STROKE PROPHYLAXIS Outcome: Progressing

## 2020-04-01 NOTE — Progress Notes (Signed)
Speech Language Pathology Daily Session Note  Patient Details  Name: Cody Henson MRN: [id number] Date of Birth: January 03, 1976  Today's Date: 04/01/2020 SLP Individual Time: 1130-1200 SLP Individual Time Calculation (min): 30 min  Short Term Goals: Week 2: SLP Short Term Goal 1 (Week 2): STGs=LTGs due to ELOS  Skilled Therapeutic Interventions: Skilled treatment session focused on speech goals. SLP facilitated session by providing extra time and overall supervision level verbal cues for patient to self-monitor and correct speech errors during an abstract verbal description task. Patient was overall Mod I for word-finding. Patient left upright in his wheelchair with alarm on and all needs within reach. Continue with current plan of care.      Pain No/Denies Pain   Therapy/Group: Individual Therapy  Lavone Barrientes 04/01/2020, 2:36 PM

## 2020-04-02 DIAGNOSIS — I63512 Cerebral infarction due to unspecified occlusion or stenosis of left middle cerebral artery: Secondary | ICD-10-CM

## 2020-04-02 DIAGNOSIS — I10 Essential (primary) hypertension: Secondary | ICD-10-CM

## 2020-04-02 NOTE — Plan of Care (Signed)
  Problem: Consults Goal: RH STROKE PATIENT EDUCATION Description: See Patient Education module for education specifics  Outcome: Progressing   Problem: RH BLADDER ELIMINATION Goal: RH STG MANAGE BLADDER WITH ASSISTANCE Description: STG Manage Bladder With Assistance, MOD I Outcome: Progressing   Problem: RH COGNITION-NURSING Goal: RH STG USES MEMORY AIDS/STRATEGIES W/ASSIST TO PROBLEM SOLVE Description: STG Uses Memory Aids/Strategies With Assistance to Problem Solve. Min assist Outcome: Progressing   Problem: RH BLADDER ELIMINATION Goal: RH STG MANAGE BLADDER WITH ASSISTANCE Description: STG Manage Bladder With mod I Assistance Outcome: Progressing   Problem: RH SKIN INTEGRITY Goal: RH STG SKIN FREE OF INFECTION/BREAKDOWN Outcome: Progressing   Problem: RH SAFETY Goal: RH STG ADHERE TO SAFETY PRECAUTIONS W/ASSISTANCE/DEVICE Description: STG Adhere to Safety Precautions With mod I Assistance/Device. Outcome: Progressing   Problem: RH PAIN MANAGEMENT Goal: RH STG PAIN MANAGED AT OR BELOW PT'S PAIN GOAL Description: Pain scale <2/10 Outcome: Progressing   Problem: RH KNOWLEDGE DEFICIT Goal: RH STG INCREASE KNOWLEDGE OF HYPERTENSION Outcome: Progressing Goal: RH STG INCREASE KNOWLEDGE OF STROKE PROPHYLAXIS Outcome: Progressing

## 2020-04-02 NOTE — Progress Notes (Signed)
Physical Therapy Session Note  Patient Details  Name: Cody Henson MRN: [id number] Date of Birth: August 12, 1975  Today's Date: 04/02/2020 PT Individual Time: 1102-1202 PT Individual Time Calculation (min): 60 min   Short Term Goals: Week 2:  PT Short Term Goal 1 (Week 2): LTGs = STGs  Skilled Therapeutic Interventions/Progress Updates:     Pt received seated in [facility] and agrees to therapy. No complaint of pain. WC transport to gym for time management. PT explains rationale and directions for 6-minute walk test and pt verbalizes understanding. With supervision and verbal cues for trunk rotation, arm swing, and safety, pt completes 6 minute walk test with score of 1464'. Pt demos several instances of hitting L foot with R foot during R swing phase, but does not have any overt LOBs. Following, pt performs BERG balance test and scores 56/56.   Pt performs NMR for standing balance with focus on R lower extremity in parallel bars. Pt stands on firm surface of bosu ball with bilateral lower extremities, initially holding onto bars with both hands but gradually decreasing upper extremity support. Pt then performs marching in place progressing to single leg stance with alternating legs, generally holding onto bars with at least 1 hand. Pt performs same task on unstable, convex surface of bosu ball. Pt performs both with supervision and cues for safety and use of ankle strategy to maintain balance.   Pt performs NMR with use of trampoline and toss-and-catch activity, focusing on balance and R upper extremity NMR. Pt initially tosses ball with "chest pass" 2x20, then performs same activity facing to the R and then L of trampoline to promote trunk rotation and multiplane movement with R upper extremity.   Pt left seated in WC with all needs within reach.  Therapy Documentation Precautions:  Precautions Precautions: Fall Restrictions Weight Bearing Restrictions: No    Therapy/Group: Individual  Therapy  Breck Coons, PT, DPT 04/02/2020, 4:02 PM

## 2020-04-02 NOTE — Progress Notes (Signed)
[facility] PHYSICAL MEDICINE & REHABILITATION PROGRESS NOTE   Subjective/Complaints: In bed. No new issues. Pleased with progress  ROS: Patient denies fever, rash, sore throat, blurred vision, nausea, vomiting, diarrhea, cough, shortness of breath or chest pain, joint or back pain, headache, or mood change.    Objective:   No results found. No results for input(s): WBC, HGB, HCT, PLT in the last 72 hours. No results for input(s): NA, K, CL, CO2, GLUCOSE, BUN, CREATININE, CALCIUM in the last 72 hours.  Intake/Output Summary (Last 24 hours) at 04/02/2020 1251 Last data filed at 04/02/2020 0800 Gross per 24 hour  Intake 600 ml  Output --  Net 600 ml        Physical Exam: Vital Signs Blood pressure 123/78, pulse 73, temperature 97.6 F (36.4 C), resp. rate 18, height 5\' 8"  (1.727 m), weight 71.8 kg, SpO2 98 %. Constitutional: No distress . Vital signs reviewed. HEENT: EOMI, oral membranes moist Neck: supple Cardiovascular: RRR without murmur. No JVD    Respiratory/Chest: CTA Bilaterally without wheezes or rales. Normal effort    GI/Abdomen: BS +, non-tender, non-distended Ext: no clubbing, cyanosis, or edema Psych: pleasant and cooperative Skin: small ecchymosis on right supraclavicular area. Generalized bruising RUE.--all areas are stable  Musc: No edema in extremities.  No tenderness in extremities. Neuro: Alert Aphasic, apraxic.  Motor: RUE: 4+/5, apraxic RLE: 4+/5 proximal to distal Nl muscle tone   Assessment/Plan: 1. Functional deficits which require 3+ hours per day of interdisciplinary therapy in a comprehensive inpatient rehab setting.  Physiatrist is providing close team supervision and 24 hour management of active medical problems listed below.  Physiatrist and rehab team continue to assess barriers to discharge/monitor patient progress toward functional and medical goals  Care Tool:  Bathing    Body parts bathed by patient: Right arm,Left  arm,Chest,Abdomen,Front perineal area,Buttocks,Right upper leg,Left upper leg,Face,Left lower leg,Right lower leg         Bathing assist Assist Level: Minimal Assistance - Patient > 75%     Upper Body Dressing/Undressing Upper body dressing Upper body dressing/undressing activity did not occur (including orthotics): N/A What is the patient wearing?: Pull over shirt    Upper body assist Assist Level: Moderate Assistance - Patient 50 - 74%    Lower Body Dressing/Undressing Lower body dressing    Lower body dressing activity did not occur: N/A What is the patient wearing?: Underwear/pull up,Pants     Lower body assist Assist for lower body dressing: Moderate Assistance - Patient 50 - 74%     Toileting Toileting    Toileting assist Assist for toileting: Independent Assistive Device Comment: urinal   Transfers Chair/bed transfer  Transfers assist     Chair/bed transfer assist level: Supervision/Verbal cueing     Locomotion Ambulation   Ambulation assist      Assist level: Independent Assistive device: No Device Max distance: 500'   Walk 10 feet activity   Assist     Assist level: Independent Assistive device: No Device   Walk 50 feet activity   Assist    Assist level: Supervision/Verbal cueing Assistive device: No Device    Walk 150 feet activity   Assist    Assist level: Supervision/Verbal cueing Assistive device: No Device    Walk 10 feet on uneven surface  activity   Assist     Assist level: Supervision/Verbal cueing     Wheelchair     Assist Will patient use wheelchair at discharge?: No Type of Wheelchair: Manual  Wheelchair assist level: Supervision/Verbal cueing Max wheelchair distance: 100    Wheelchair 50 feet with 2 turns activity    Assist        Assist Level: Supervision/Verbal cueing   Wheelchair 150 feet activity     Assist          Blood pressure 123/78, pulse 73, temperature 97.6 F  (36.4 C), resp. rate 18, height 5\' 8"  (1.727 m), weight 71.8 kg, SpO2 98 %.  Medical Problem List and Plan: 1.  Right hemiparesis with aphasia secondary to acute ischemic infarct left MCA distribution.  Status post TPA  Continue CIR, ELOS 2/16--outpt therapy f/u  2.  Antithrombotics: -DVT/anticoagulation: Lovenox             -antiplatelet therapy: Aspirin 81 mg daily and Plavix 75 mg daily x3 weeks and aspirin alone  -may be someone who tends to bruise with this regimen  -hgb and plts normal 3. Pain Management: Tylenol as needed 4. Mood: Zoloft 50 mg daily.  Provide emotional support             -antipsychotic agents: N/A  -on adderall pta  5. Neuropsych: This patient is ?fully capable of making decisions on his own behalf. 6. Skin/Wound Care: Routine skin checks 7. Fluids/Electrolytes/Nutrition:               -recent labs ok.   8. PFO identified on TEE.  Follow-up with Dr. Burt Knack as outpatient 9. Hypertension.  No antihypertensive medications prior to admission.     2/12 -increased lisinopril to 10mg  on 2/11 with improvement 10. Urinary retention: continue voiding trial  -OOB to toilet/commode to empty              Continue low dose urecholine urecholine  Emptying his bladder now 11.  Dyslipidemia: Lipitor 12. Depression: On Zoloft 13. Elevated LFT's  LFTs now almost within normal range    LOS: 10 days A FACE TO FACE EVALUATION WAS PERFORMED  Meredith Staggers 04/02/2020, 12:51 PM

## 2020-04-03 NOTE — Plan of Care (Signed)
  Problem: Consults Goal: RH STROKE PATIENT EDUCATION Description: See Patient Education module for education specifics  Outcome: Progressing   Problem: RH BLADDER ELIMINATION Goal: RH STG MANAGE BLADDER WITH ASSISTANCE Description: STG Manage Bladder With Assistance, MOD I Outcome: Progressing   Problem: RH COGNITION-NURSING Goal: RH STG USES MEMORY AIDS/STRATEGIES W/ASSIST TO PROBLEM SOLVE Description: STG Uses Memory Aids/Strategies With Assistance to Problem Solve. Min assist Outcome: Progressing   Problem: RH BLADDER ELIMINATION Goal: RH STG MANAGE BLADDER WITH ASSISTANCE Description: STG Manage Bladder With mod I Assistance Outcome: Progressing   Problem: RH SKIN INTEGRITY Goal: RH STG SKIN FREE OF INFECTION/BREAKDOWN Outcome: Progressing   Problem: RH SAFETY Goal: RH STG ADHERE TO SAFETY PRECAUTIONS W/ASSISTANCE/DEVICE Description: STG Adhere to Safety Precautions With mod I Assistance/Device. Outcome: Progressing   Problem: RH PAIN MANAGEMENT Goal: RH STG PAIN MANAGED AT OR BELOW PT'S PAIN GOAL Description: Pain scale <2/10 Outcome: Progressing   Problem: RH KNOWLEDGE DEFICIT Goal: RH STG INCREASE KNOWLEDGE OF HYPERTENSION Outcome: Progressing Goal: RH STG INCREASE KNOWLEDGE OF STROKE PROPHYLAXIS Outcome: Progressing

## 2020-04-03 NOTE — Progress Notes (Signed)
Patient in recliner and in good spirits, denies pain or discomfort, able to make his needs know to staff, Assisted with ADL,call bell within reach,continue regimen

## 2020-04-04 LAB — CBC
HCT: 43.3 % (ref 39.0–52.0)
Hemoglobin: 15.4 g/dL (ref 13.0–17.0)
MCH: 30.2 pg (ref 26.0–34.0)
MCHC: 35.6 g/dL (ref 30.0–36.0)
MCV: 84.9 fL (ref 80.0–100.0)
Platelets: 358 10*3/uL (ref 150–400)
RBC: 5.1 MIL/uL (ref 4.22–5.81)
RDW: 12.3 % (ref 11.5–15.5)
WBC: 8.2 10*3/uL (ref 4.0–10.5)
nRBC: 0 % (ref 0.0–0.2)

## 2020-04-04 NOTE — Progress Notes (Addendum)
Patient ID: Cody Henson, male   DOB: 11-12-1975, 45 y.o.   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   Policy #[id number]  SW provided updates insurance information to appropriate parties to make changes to pt account.   SW spoke with [facility] ([phone]/f:[phone]) reporting pt has been scheduled and they have updated [employer] information already.  Loralee Pacas, MSW, [place] Office: [phone] Cell: [phone] Fax: [phone]

## 2020-04-04 NOTE — Progress Notes (Signed)
Speech Language Pathology Daily Session Note  Patient Details  Name: Cody Henson MRN: [id number] Date of Birth: 1976/02/17  Today's Date: 04/04/2020 SLP Individual Time: 1102-1200 SLP Individual Time Calculation (min): 58 min  Short Term Goals: Week 2: SLP Short Term Goal 1 (Week 2): STGs=LTGs due to ELOS  Skilled Therapeutic Interventions: Pt seen for skilled ST with focus on speech and communication, wife present for family education. Pt continues to endorse deficits in short term recall and attention, impacting speech, cognition, reading and communication. SLP, pt and wife reviewing strategies to increase communication and language function at home while reducing frustrations. Pt encouraged to complete functional language tasks including reading aloud to daughter, reading pages of books of interest, closed captions on TV and calling friends and family on the phone frequently. Pt educated on compensatory strategies for speech intelligibility and strategies for family to utilize and maintain at d/c (extra time and phonemic/articulatory cueing as needed, fatigue impacting speech, etc). Pt completing mildly complex divided attention language task benefiting from supervision level verbal cues for 100% accuracy. Pt and wife with no further questions, pt left in chair with all needs met. Cont ST POC.   Pain Pain Assessment Pain Scale: 0-10 Pain Score: 0-No pain  Therapy/Group: Individual Therapy  Dewaine Conger 04/04/2020, 12:17 PM

## 2020-04-04 NOTE — Progress Notes (Signed)
Physical Therapy Session Note  Patient Details  Name: Cody Henson MRN: [id number] Date of Birth: 22-Dec-1975  Today's Date: 04/04/2020 PT Individual Time: 0902-0959 PT Individual Time Calculation (min): 57 min   Short Term Goals: Week 2:  PT Short Term Goal 1 (Week 2): LTGs = STGs  Skilled Therapeutic Interventions/Progress Updates:     Pt received seated in [facility] with wife present for family ed. No complaint of pain. Pt performs all mobility during session with supervision. Sit to stand and ambulation x400' with cues for increased trunk rotation and arm swing. Following PT demo, pt performs car transfer and ramp navigation. Pt ambulates up/down 16 steps with L hand rail and cues on step sequencing and hand placement with R hand.   NMR: Pt positioned in quadruped on high low table, tasked with using R upper extremity to sequence numbered tiles. Activity performed to promote WB through R upper extremity with cognitive overlay. Pt then performs standing balance activity with targeted toe tapping on colored and numbered circles. Pt is able to consistently perform correctly with up to 3 tap sequence and performs 4 tap sequence correctly ~50% of time. PT progresses difficulty of activity by incorporating both number and colors into commands, as well as alternating lower extremity for targeted steps. Pt takes several rest breaks during activity. Pt then performs balance activity in parallel bars, standing on Bosu ball with bilateral upper extremity support and progressing to single and no upper extremity support, then progressing to single leg stance on R. Pt and wife educated on performing balance activities safely at home following DC. Pt ambulates back to room, x300'. Pt left seated in WC with all needs within reach.  Therapy Documentation Precautions:  Precautions Precautions: Fall Restrictions Weight Bearing Restrictions: No    Therapy/Group: Individual Therapy  Breck Coons, PT,  DPT 04/04/2020, 12:33 PM

## 2020-04-04 NOTE — Progress Notes (Signed)
[place] PHYSICAL MEDICINE & REHABILITATION PROGRESS NOTE   Subjective/Complaints: Asks about prior symptoms and if they could have been TIAs Wife asks about follow-up care and prognosis He is practicing with putty and seeing improvements Ambulating >600 feet and with significant abdominal bruising, will d/c Lovenox.   ROS: Patient denies fever, rash, sore throat, blurred vision, nausea, vomiting, diarrhea, cough, shortness of breath or chest pain, joint or back pain, headache, or mood change.    Objective:   No results found. Recent Labs    04/04/20 0459  WBC 8.2  HGB 15.4  HCT 43.3  PLT 358   No results for input(s): NA, K, CL, CO2, GLUCOSE, BUN, CREATININE, CALCIUM in the last 72 hours.  Intake/Output Summary (Last 24 hours) at 04/04/2020 1250 Last data filed at 04/04/2020 0700 Gross per 24 hour  Intake 736 ml  Output --  Net 736 ml        Physical Exam: Vital Signs Blood pressure 121/83, pulse 62, temperature 97.8 F (36.6 C), temperature source Oral, resp. rate 18, height 5\' 8"  (1.727 m), weight 71.8 kg, SpO2 96 %. Gen: no distress, normal appearing HEENT: oral mucosa pink and moist, NCAT Cardio: Reg rate Chest: normal effort, normal rate of breathing Abd: soft, non-distended Ext: no edema Psych: pleasant, normal affect Skin: small ecchymosis on right supraclavicular area. Generalized bruising RUE and abdomen Musc: No edema in extremities.  No tenderness in extremities. Neuro: Alert Aphasic, apraxic.  Motor: RUE: 4+/5, apraxic RLE: 4+/5 proximal to distal Nl muscle tone   Assessment/Plan: 1. Functional deficits which require 3+ hours per day of interdisciplinary therapy in a comprehensive inpatient rehab setting.  Physiatrist is providing close team supervision and 24 hour management of active medical problems listed below.  Physiatrist and rehab team continue to assess barriers to discharge/monitor patient progress toward functional and medical  goals  Care Tool:  Bathing    Body parts bathed by patient: Right arm,Left arm,Chest,Abdomen,Front perineal area,Buttocks,Right upper leg,Left upper leg   Body parts bathed by helper: Right lower leg,Left lower leg     Bathing assist Assist Level: Minimal Assistance - Patient > 75%     Upper Body Dressing/Undressing Upper body dressing Upper body dressing/undressing activity did not occur (including orthotics): N/A What is the patient wearing?: Pull over shirt    Upper body assist Assist Level: Minimal Assistance - Patient > 75%    Lower Body Dressing/Undressing Lower body dressing    Lower body dressing activity did not occur: N/A What is the patient wearing?: Underwear/pull up,Pants     Lower body assist Assist for lower body dressing: Moderate Assistance - Patient 50 - 74%     Toileting Toileting    Toileting assist Assist for toileting: Independent Assistive Device Comment: urinal   Transfers Chair/bed transfer  Transfers assist     Chair/bed transfer assist level: Supervision/Verbal cueing     Locomotion Ambulation   Ambulation assist      Assist level: Supervision/Verbal cueing Assistive device: No Device Max distance: 400'   Walk 10 feet activity   Assist     Assist level: Supervision/Verbal cueing Assistive device: No Device   Walk 50 feet activity   Assist    Assist level: Supervision/Verbal cueing Assistive device: No Device    Walk 150 feet activity   Assist    Assist level: Supervision/Verbal cueing Assistive device: No Device    Walk 10 feet on uneven surface  activity   Assist     Assist  level: Supervision/Verbal cueing     Wheelchair     Assist Will patient use wheelchair at discharge?: No Type of Wheelchair: Manual    Wheelchair assist level: Supervision/Verbal cueing Max wheelchair distance: 100    Wheelchair 50 feet with 2 turns activity    Assist        Assist Level:  Supervision/Verbal cueing   Wheelchair 150 feet activity     Assist          Blood pressure 121/83, pulse 62, temperature 97.8 F (36.6 C), temperature source Oral, resp. rate 18, height 5\' 8"  (1.727 m), weight 71.8 kg, SpO2 96 %.  Medical Problem List and Plan: 1.  Right hemiparesis with aphasia secondary to acute ischemic infarct left MCA distribution.  Status post TPA  Continue CIR, ELOS 2/16--outpt therapy f/u  2.  Antithrombotics: -DVT/anticoagulation: d/c Lovenox since ambulating >600 feet and has significant abdominal bruising             -antiplatelet therapy: Aspirin 81 mg daily and Plavix 75 mg daily x3 weeks and aspirin alone  -may be someone who tends to bruise with this regimen  -hgb and plts continue to be normal on 2/14.  3. Pain Management: Tylenol as needed 4. Mood: Zoloft 50 mg daily.  Provide emotional support             -antipsychotic agents: N/A  -on adderall pta  5. Neuropsych: This patient is ?fully capable of making decisions on his own behalf. 6. Skin/Wound Care: Routine skin checks 7. Fluids/Electrolytes/Nutrition:               -recent labs ok. 8. PFO identified on TEE.  Follow-up with Dr. Burt Knack as outpatient. Appreciate RN scheduling this follow-up for them on 3/24.  9. Hypertension.  No antihypertensive medications prior to admission.     2/12 -increased lisinopril to 10mg  on 2/11 with improvement  2/14: well controlled, continue Lisinopril.  10. Urinary retention: continue voiding trial  -OOB to toilet/commode to empty              Continue low dose urecholine urecholine  Emptying his bladder now 11.  Dyslipidemia: Lipitor 12. Depression: On Zoloft 13. Elevated LFT's  LFTs now almost within normal range 14. Disposition: Plan for d/c 2/16 home with wife with outpatient follow-up.     LOS: 12 days A FACE TO FACE EVALUATION WAS PERFORMED  Clide Deutscher Krystyl Cannell 04/04/2020, 12:50 PM

## 2020-04-04 NOTE — Progress Notes (Signed)
Occupational Therapy Session Note  Patient Details  Name: Cody Henson MRN: [id number] Date of Birth: 1975-03-31  Today's Date: 04/04/2020 OT Individual Time: 1000-1100 OT Individual Time Calculation (min): 60 min   Short Term Goals: Week 2:  OT Short Term Goal 1 (Week 2): LTG=STG 2/2 ELOS  Skilled Therapeutic Interventions/Progress Updates:    Pt greeted seated in wc with spouse present for family education. Pt ambulated down to therapy apartment and practiced walk-in-shower transfer in simulated home environment. Discussed DME needs of shower chair which family to purchase privately. Pt then ambulated to dayroom and was given home fine motor program, theraputty exercise sheet, and UE exercise home program. Reviewed exercises indlucing 5 reps of each UE exercise using ornage theraband. Pt ambulated back to room and completed shower and dressing tasks at supervision level. Pt left seated in wc with spouse present and needs met.   Therapy Documentation Precautions:  Precautions Precautions: Fall Restrictions Weight Bearing Restrictions: No Pain: Pain Assessment Pain Scale: 0-10 Pain Score: 0-No pain   Therapy/Group: Individual Therapy  Valma Cava 04/04/2020, 11:20 AM

## 2020-04-04 NOTE — Telephone Encounter (Signed)
With the patient's permission, spoke with wife, Joellen Jersey, and scheduled him for PFO consult with Dr. Burt Knack 3/24. They were grateful for call and agree with plan.

## 2020-04-05 NOTE — Progress Notes (Signed)
Physical Therapy Discharge Summary  Patient Details  Name: Cody Henson MRN: [id number] Date of Birth: 06/13/1975  Today's Date: 04/05/2020 PT Individual Time: 0920-1029 PT Individual Time Calculation (min): 69 min    Patient has met 8 of 8 long term goals due to improved balance, improved postural control, increased strength and improved coordination.  Patient to discharge at an ambulatory level Modified Independent.   Patient's care partner is independent to provide the necessary physical and cognitive assistance at discharge.  Reasons goals not met: NA  Recommendation:  Patient will benefit from ongoing skilled PT services in outpatient setting to continue to advance safe functional mobility, address ongoing impairments in strength, balance, coordination, and minimize fall risk.  Equipment: No equipment provided  Reasons for discharge: treatment goals met and discharge from hospital  Patient/family agrees with progress made and goals achieved: Yes   Skilled therapeutic Interventions: Pt received seated in [place] and agrees to therapy. No complaint of pain. Pt ambulates to gym independently and perform car transfer and ramp navigation without verbal cues required. Pt then ambulates >500' while performing cognitive task of naming supermarket items in alphabetical order. Pt requires increased time to perform cognitive task but continues to ambulate with good balance and speed, only demonstrating decreased eccentric control of dorsiflexion during early stance phase. Pt completes x16 steps with L hand rail independently. Pt then performs 2x10 single leg heel raises with R leg, holding onto counter for support, and 2x10 toe raises. Pt performs NMR for standing balance and coordination, standing on airex mat for unstable surface, and placing magnets on checkers board with R upper extremity. Pt then perform Wii bowling, focusing on use of visual feedback for improved coordination. Pt left seated in WC  with all needs within reach.  PT Discharge Precautions/Restrictions Precautions Precautions: Fall Precaution Comments: R hemiplegia Restrictions Weight Bearing Restrictions: No Pain Pain Assessment Pain Scale: 0-10 Pain Score: 0-No pain Vision/Perception  Perception Perception: Within Functional Limits Praxis Praxis: Impaired Praxis Impairment Details: Motor planning  Cognition Overall Cognitive Status: Impaired/Different from baseline Arousal/Alertness: Awake/alert Orientation Level: Oriented X4 Sustained Attention: Appears intact Alternating Attention: Impaired Alternating Attention Impairment: Functional complex Memory: Impaired Memory Impairment: Decreased recall of new information Awareness: Appears intact Problem Solving: Appears intact Safety/Judgment: Appears intact Sensation Sensation Light Touch: Impaired Detail Light Touch Impaired Details: Impaired RUE;Impaired RLE Proprioception: Impaired Detail Proprioception Impaired Details: Impaired RUE;Impaired RLE Coordination Gross Motor Movements are Fluid and Coordinated: No Fine Motor Movements are Fluid and Coordinated: No Finger Nose Finger Test: Improved grading of movements, still has decreased smoothness and accuracy, but imprved since eval Motor  Motor Motor: Ataxia;Motor apraxia Motor - Discharge Observations: improved since eval  Mobility Bed Mobility Supine to Sit: Independent Sit to Supine: Independent Transfers Transfers: Sit to Stand;Stand to Sit;Stand Pivot Transfers Sit to Stand: Independent Stand to Sit: Independent Stand Pivot Transfers: Independent Transfer (Assistive device): None Locomotion  Gait Ambulation: Yes Gait Assistance: Independent Gait Distance (Feet): 500 Feet Assistive device: None Gait Gait: Yes Gait Pattern: Impaired Gait Pattern:  (decreased eccentric control of plantarflexion on R) Stairs / Additional Locomotion Stairs: Yes Stairs Assistance: Independent with  assistive device Stair Management Technique: One rail Left Number of Stairs: 16 Height of Stairs: 6 Ramp: Independent Curb: Independent with assistive device Wheelchair Mobility Wheelchair Mobility: No  Trunk/Postural Assessment  Cervical Assessment Cervical Assessment: Within Functional Limits Thoracic Assessment Thoracic Assessment: Within Functional Limits Lumbar Assessment Lumbar Assessment: Within Functional Limits Postural Control Postural Control: Within Functional Limits  Balance Balance Balance Assessed: Yes Static Sitting Balance Static Sitting - Level of Assistance: 7: Independent Dynamic Sitting Balance Dynamic Sitting - Level of Assistance: 7: Independent Static Standing Balance Static Standing - Balance Support: During functional activity Static Standing - Level of Assistance: 7: Independent Dynamic Standing Balance Dynamic Standing - Balance Support: During functional activity Dynamic Standing - Level of Assistance: 7: Independent Extremity Assessment  RUE Assessment RUE Assessment: Exceptions to [facility] (deficits in ataxia and apraxia) Passive Range of Motion (PROM) Comments: WNL Active Range of Motion (AROM) Comments: WFL General Strength Comments: 4-/5 LUE Assessment LUE Assessment: Within Functional Limits RLE Assessment RLE Assessment: Exceptions to [facility] General Strength Comments: Hip and Knee 5/5, R ankle grossly 4/5 LLE Assessment LLE Assessment: Within Functional Limits    Breck Coons, PT, DPT 04/05/2020, 4:26 PM

## 2020-04-05 NOTE — Progress Notes (Signed)
Patient ID: Cody Henson, male   DOB: 1975/10/11, 45 y.o.   MRN: [id number]  SW met with pt in room to provide updates on gains made while here in rehab, and pt on target for d/c tomorrow. No questions/concerns reported.   Loralee Pacas, MSW, [place] Office: [phone] Cell: [phone] Fax: [phone]

## 2020-04-05 NOTE — Progress Notes (Signed)
Occupational Therapy Discharge Summary  Patient Details  Name: Cody Henson MRN: [id number] Date of Birth: 1975-09-04  Today's Date: 04/05/2020 OT Individual Time: 1300-1400 OT Individual Time Calculation (min): 60 min    OT treatment session focused on increased independence with BADL tasks. Pt able to access dresser drawers, collect clothing, ambulate to bathroom all without assist from OT. Bathing/dressing completed mod I with increased time. See functional navigator for further details. Pt then ambulated to the dyaroom and worked on fine motor activities with much improved grading of movement and functional pinch. Pt returned to room and left seated in wc with needs met.  Patient has met 8 of 8 long term goals due to improved activity tolerance, improved balance, postural control, ability to compensate for deficits, functional use of  RIGHT upper and RIGHT lower extremity, improved attention, improved awareness and improved coordination.  Patient to discharge at overall Modified Independent /supervision level.  Patient's care partner is independent to provide the necessary physical assistance at discharge.    Reasons goals not met: n/a  Recommendation:  Patient will benefit from ongoing skilled OT services in outpatient setting to continue to advance functional skills in the area of BADL and functional use of R UE.  Equipment: No equipment provided  Reasons for discharge: treatment goals met and discharge from hospital  Patient/family agrees with progress made and goals achieved: Yes  OT Discharge Precautions/Restrictions  Precautions Precautions: Fall Precaution Comments: R hemiplegia Pain Pain Assessment Pain Scale: 0-10 Pain Score: 0-No pain ADL ADL Eating: Modified independent Grooming: Modified independent Where Assessed-Grooming: Sitting at sink Upper Body Bathing: Modified independent Where Assessed-Upper Body Bathing: Sitting at sink Lower Body Bathing: Modified  independent Where Assessed-Lower Body Bathing: Standing at sink,Sitting at sink Upper Body Dressing: Modified independent (Device) Where Assessed-Upper Body Dressing: Sitting at sink Lower Body Dressing: Modified independent Where Assessed-Lower Body Dressing: Sitting at sink,Standing at sink Toileting: Modified independent Where Assessed-Toileting: Risk analyst Method: ambulating Social research officer, government: Distant supervision Cognition Overall Cognitive Status: Impaired/Different from baseline Arousal/Alertness: Awake/alert Orientation Level: Oriented X4 Sustained Attention: Appears intact Alternating Attention: Impaired Alternating Attention Impairment: Functional complex Memory: Impaired Memory Impairment: Decreased recall of new information Awareness: Appears intact Problem Solving: Appears intact Safety/Judgment: Appears intact Sensation Sensation Light Touch: Impaired Detail Light Touch Impaired Details: Impaired RUE;Impaired RLE Proprioception: Impaired Detail Proprioception Impaired Details: Impaired RUE;Impaired RLE Coordination Gross Motor Movements are Fluid and Coordinated: No Fine Motor Movements are Fluid and Coordinated: No Finger Nose Finger Test: Improved grading of movements, still has decreased smoothness and accuracy, but imprved since eval Motor  Motor Motor: Ataxia;Motor apraxia Motor - Discharge Observations: improved since eval Mobility  Transfers Sit to Stand: Independent with assistive device Stand to Sit: Independent with assistive device  Balance Static Sitting Balance Static Sitting - Level of Assistance: 7: Independent Dynamic Sitting Balance Dynamic Sitting - Level of Assistance: 7: Independent Static Standing Balance Static Standing - Balance Support: During functional activity Static Standing - Level of Assistance: 6: Modified independent (Device/Increase time) Dynamic Standing  Balance Dynamic Standing - Balance Support: During functional activity Dynamic Standing - Level of Assistance: 5: Stand by assistance Extremity/Trunk Assessment RUE Assessment RUE Assessment: Exceptions to [facility] (deficits in ataxia and apraxia) Passive Range of Motion (PROM) Comments: WNL Active Range of Motion (AROM) Comments: WFL General Strength Comments: 4-/5 LUE Assessment LUE Assessment: Within Functional Limits   Daneen Schick Mayank Teuscher 04/05/2020, 2:04 PM

## 2020-04-05 NOTE — Progress Notes (Signed)
[place] PHYSICAL MEDICINE & REHABILITATION PROGRESS NOTE   Subjective/Complaints: Pt up in bed. No new issues. Says that family ed went well yesterday. Looking forward to discharge tomorrow  ROS: Patient denies fever, rash, sore throat, blurred vision, nausea, vomiting, diarrhea, cough, shortness of breath or chest pain, joint or back pain, headache, or mood change.   Objective:   No results found. Recent Labs    04/04/20 0459  WBC 8.2  HGB 15.4  HCT 43.3  PLT 358   No results for input(s): NA, K, CL, CO2, GLUCOSE, BUN, CREATININE, CALCIUM in the last 72 hours.  Intake/Output Summary (Last 24 hours) at 04/05/2020 0948 Last data filed at 04/05/2020 0700 Gross per 24 hour  Intake 600 ml  Output --  Net 600 ml        Physical Exam: Vital Signs Blood pressure 121/84, pulse 67, temperature 97.9 F (36.6 C), temperature source Oral, resp. rate 16, height 5\' 8"  (1.727 m), weight 71.8 kg, SpO2 96 %. Constitutional: No distress . Vital signs reviewed. HEENT: EOMI, oral membranes moist Neck: supple Cardiovascular: RRR without murmur. No JVD    Respiratory/Chest: CTA Bilaterally without wheezes or rales. Normal effort    GI/Abdomen: BS +, non-tender, non-distended Ext: no clubbing, cyanosis, or edema Psych: pleasant and cooperative Skin: resolving ecchymoses and bruises Musc: No edema in extremities.  No tenderness in extremities. Neuro: Alert Aphasic, apraxic pattern  Motor: RUE: 4+to 5/5, more automatic RLE: 4+ to 5/5 proximal to distal Nl muscle tone   Assessment/Plan: 1. Functional deficits which require 3+ hours per day of interdisciplinary therapy in a comprehensive inpatient rehab setting.  Physiatrist is providing close team supervision and 24 hour management of active medical problems listed below.  Physiatrist and rehab team continue to assess barriers to discharge/monitor patient progress toward functional and medical goals  Care Tool:  Bathing     Body parts bathed by patient: Right arm,Left arm,Chest,Abdomen,Front perineal area,Buttocks,Right upper leg,Left upper leg   Body parts bathed by helper: Right lower leg,Left lower leg     Bathing assist Assist Level: Minimal Assistance - Patient > 75%     Upper Body Dressing/Undressing Upper body dressing Upper body dressing/undressing activity did not occur (including orthotics): N/A What is the patient wearing?: Pull over shirt    Upper body assist Assist Level: Minimal Assistance - Patient > 75%    Lower Body Dressing/Undressing Lower body dressing    Lower body dressing activity did not occur: N/A What is the patient wearing?: Underwear/pull up,Pants     Lower body assist Assist for lower body dressing: Moderate Assistance - Patient 50 - 74%     Toileting Toileting    Toileting assist Assist for toileting: Independent Assistive Device Comment: urinal   Transfers Chair/bed transfer  Transfers assist     Chair/bed transfer assist level: Supervision/Verbal cueing     Locomotion Ambulation   Ambulation assist      Assist level: Supervision/Verbal cueing Assistive device: No Device Max distance: 400'   Walk 10 feet activity   Assist     Assist level: Supervision/Verbal cueing Assistive device: No Device   Walk 50 feet activity   Assist    Assist level: Supervision/Verbal cueing Assistive device: No Device    Walk 150 feet activity   Assist    Assist level: Supervision/Verbal cueing Assistive device: No Device    Walk 10 feet on uneven surface  activity   Assist     Assist level: Supervision/Verbal cueing  Wheelchair     Assist Will patient use wheelchair at discharge?: No Type of Wheelchair: Manual    Wheelchair assist level: Supervision/Verbal cueing Max wheelchair distance: 100    Wheelchair 50 feet with 2 turns activity    Assist        Assist Level: Supervision/Verbal cueing   Wheelchair 150 feet  activity     Assist          Blood pressure 121/84, pulse 67, temperature 97.9 F (36.6 C), temperature source Oral, resp. rate 16, height 5\' 8"  (1.727 m), weight 71.8 kg, SpO2 96 %.  Medical Problem List and Plan: 1.  Right hemiparesis with aphasia secondary to acute ischemic infarct left MCA distribution.  Status post TPA  Continue CIR, ELOS 2/16-->outpt therapy f/u   -team conference today 2.  Antithrombotics: -DVT/anticoagulation: d/c Lovenox since ambulating >600 feet and has significant abdominal bruising             -antiplatelet therapy: Aspirin 81 mg daily and Plavix 75 mg daily x3 weeks and aspirin alone  -may be someone who tends to bruise with this regimen  -hgb and plts continue to be normal on 2/14.  3. Pain Management: Tylenol as needed 4. Mood: Zoloft 50 mg daily.  Provide emotional support             -antipsychotic agents: N/A  -on adderall pta  5. Neuropsych: This patient is ?fully capable of making decisions on his own behalf. 6. Skin/Wound Care: Routine skin checks 7. Fluids/Electrolytes/Nutrition:               -recent labs ok. 8. PFO identified on TEE.  Follow-up with Dr. Burt Knack as outpatient. Appreciate RN scheduling this follow-up for them on 3/24.  9. Hypertension.  No antihypertensive medications prior to admission.     2/12 -increased lisinopril to 10mg  on 2/11 with improvement  2/15: well controlled, continue Lisinopril.  10. Urinary retention: emptying bladder well now.  2/15 -dc urecholine 11.  Dyslipidemia: Lipitor 12. Depression: On Zoloft 13. Elevated LFT's  LFTs now almost within normal range     LOS: 13 days A FACE TO FACE EVALUATION WAS PERFORMED  Meredith Staggers 04/05/2020, 9:48 AM

## 2020-04-05 NOTE — Plan of Care (Signed)
  Problem: Sit to Stand Goal: LTG:  Patient will perform sit to stand in prep for activites of daily living with assistance level (OT) Description: LTG:  Patient will perform sit to stand in prep for activites of daily living with assistance level (OT) Outcome: Completed/Met   Problem: RH Grooming Goal: LTG Patient will perform grooming w/assist,cues/equip (OT) Description: LTG: Patient will perform grooming with assist, with/without cues using equipment (OT) Outcome: Completed/Met   Problem: RH Bathing Goal: LTG Patient will bathe all body parts with assist levels (OT) Description: LTG: Patient will bathe all body parts with assist levels (OT) Outcome: Completed/Met   Problem: RH Dressing Goal: LTG Patient will perform upper body dressing (OT) Description: LTG Patient will perform upper body dressing with assist, with/without cues (OT). Outcome: Completed/Met Goal: LTG Patient will perform lower body dressing w/assist (OT) Description: LTG: Patient will perform lower body dressing with assist, with/without cues in positioning using equipment (OT) Outcome: Completed/Met   Problem: RH Toileting Goal: LTG Patient will perform toileting task (3/3 steps) with assistance level (OT) Description: LTG: Patient will perform toileting task (3/3 steps) with assistance level (OT)  Outcome: Completed/Met   Problem: RH Toilet Transfers Goal: LTG Patient will perform toilet transfers w/assist (OT) Description: LTG: Patient will perform toilet transfers with assist, with/without cues using equipment (OT) Outcome: Completed/Met   Problem: RH Tub/Shower Transfers Goal: LTG Patient will perform tub/shower transfers w/assist (OT) Description: LTG: Patient will perform tub/shower transfers with assist, with/without cues using equipment (OT) Outcome: Completed/Met   

## 2020-04-05 NOTE — Patient Care Conference (Signed)
Inpatient RehabilitationTeam Conference and Plan of Care Update Date: 04/05/2020   Time: 10:51 AM    Patient Name: Cody Henson      Medical Record Number: [id number]  Date of Birth: Aug 16, 1975 Sex: Male         Room/Bed: 4W17C/4W17C-01 Payor Info: Payor: Theme park manager / Plan: [employer] OTHER / Product Type: *No Product type* /    Admit Date/Time:  03/23/2020  5:04 PM  Primary Diagnosis:  Left middle cerebral artery stroke [facility])  Hospital Problems: Principal Problem:   Left middle cerebral artery stroke ([place]) Active Problems:   Blood pressure increase diastolic   Transaminitis   Hemiparesis affecting dominant side as late effect of stroke [facility])    Expected Discharge Date: Expected Discharge Date: 04/06/20  Team Members Present: Physician leading conference: Dr. Alger Simons Care Coodinator Present: Loralee Pacas, LCSWA;Amit Leece Creig Hines, RN, BSN, [place] Nurse Present: Other (comment) Jarvis Morgan, RN) PT Present: Tereasa Coop, PT OT Present: Cherylynn Ridges, OT SLP Present: Weston Anna, SLP PPS Coordinator present : Ileana Ladd, Burna Mortimer, SLP     Current Status/Progress Goal Weekly Team Focus  Bowel/Bladder   Patient remains continent of Bladder/Bowel  Maintain continence  QS/PRN assess toileting needs   Swallow/Nutrition/ Hydration             ADL's   Supervision  Supervision  dc planning, self=care retraining, pt/family ed, R NMR   Mobility   mod(I) to (I) for all mobility  S overall  DC prep   Communication   Supervision-Mod I  Mod I  Family Education   Safety/Cognition/ Behavioral Observations            Pain   Denies pain has prn orders as needed  painfree  QS/PRN assess pain   Skin   Skin is intact  prevent skin break down and infection  Assess skin QS/PRN address all issies and concerns     Discharge Planning:  D/c to home with his wife who will provide 24/7 care. Pt set up with [facility] for PT/OT/SLP.   Team  Discussion: Medically stable, ready for discharge. Continent B/B, no pain reported. Family education completed. Patient on target to meet rehab goals: Mod I for ADL's. Mod I in room. Ready for discharge. Requires extra time for speech.  *See Care Plan and progress notes for long and short-term goals.   Revisions to Treatment Plan:  None discussed.  Teaching Needs: Family education, medication management, transfer training, gait training.  Current Barriers to Discharge: Inaccessible home environment, Decreased caregiver support, Home enviroment access/layout, Lack of/limited family support, Medication compliance and Behavior  Possible Resolutions to Barriers: Continue current medications, provide emotional support to patient and family.     Medical Summary Current Status: bp better controlled. language and initiation better  Barriers to Discharge: Medical stability   Possible Resolutions to Barriers/Weekly Focus: daily pt data/vs assessment, finalize dc planning   Continued Need for Acute Rehabilitation Level of Care: The patient requires daily medical management by a physician with specialized training in physical medicine and rehabilitation for the following reasons: Direction of a multidisciplinary physical rehabilitation program to maximize functional independence : Yes Medical management of patient stability for increased activity during participation in an intensive rehabilitation regime.: Yes Analysis of laboratory values and/or radiology reports with any subsequent need for medication adjustment and/or medical intervention. : Yes   I attest that I was present, lead the team conference, and concur with the assessment and plan of the team.  Dorthula Nettles G 04/05/2020, 4:03 PM

## 2020-04-05 NOTE — Progress Notes (Signed)
Speech Language Pathology Discharge Summary  Patient Details  Name: Cody Henson MRN: [id number] Date of Birth: 04-27-1975  Today's Date: 04/05/2020 SLP Individual Time: 1400-1500 SLP Individual Time Calculation (min): 60 min   Skilled Therapeutic Interventions:  Skilled treatment session focused on speech and language goals. Patient answered complex yes/no questions with 100% accuracy and followed 3-step commands with extra time. Patient also performed responsive, divergent and convergent naming tasks with 100% accuracy and named 22 items within a specific category. Patient also performed repetitions task at the word and phrase level with overall Mod I. Patient performed reading comprehension tasks at the paragraph level with 100% accuracy and self-monitored and corrected errors during written expression at the sentence level with mod I. SLP also provided education regarding strategies to maximize functional communication at discharge. He verbalized understanding and agreement. Patient left upright in wheelchair with alarm on and all needs within reach.    Patient has met 2 of 2 long term goals.  Patient to discharge at overall Modified Independent;Supervision level.   Reasons goals not met: N/A   Clinical Impression/Discharge Summary: Patient has made functional gains and has met 2 of 2 LTGs this admission. Currently, patient continues to demonstrate a mild-moderate apraxia and requires extra time to self-monitor and correct speech errors at the sentence level. Occasionally, patient will require phonemic or articulatory cues to self-correct, but this usually occurs with more complex language and multi-syllabic words.  As the patient's overall functional communication has improved, mild high level memory impairments have been observed. Patient and family education is complete and patient will discharge home with assistance from family. Patient would benefit from f/u outpatient SLP services to maximize  his cognitive functioning and functional communication to maximize his overall functional independence.   Care Partner:  Caregiver Able to Provide Assistance: Yes     Recommendation:  Outpatient SLP  Rationale for SLP Follow Up: Reduce caregiver burden;Maximize cognitive function and independence;Maximize functional communication   Equipment: N/A   Reasons for discharge: Discharged from hospital;Treatment goals met   Patient/Family Agrees with Progress Made and Goals Achieved: Yes    Honour Schwieger, [place] 04/05/2020, 6:50 AM

## 2020-04-06 MED ORDER — PANTOPRAZOLE SODIUM 40 MG PO TBEC: 40.0000 mg | DELAYED_RELEASE_TABLET | Freq: Every day | ORAL | 0 refills | Status: DC

## 2020-04-06 MED ORDER — ATORVASTATIN CALCIUM 80 MG PO TABS: 80.0000 mg | ORAL_TABLET | Freq: Every day | ORAL | 1 refills | Status: DC

## 2020-04-06 MED ORDER — CLOPIDOGREL BISULFATE 75 MG PO TABS: 75.0000 mg | ORAL_TABLET | Freq: Every day | ORAL | 0 refills | Status: DC

## 2020-04-06 MED ORDER — INFLUENZA VAC SUBUNIT QUAD 0.5 ML IM SUSY
0.5000 mL | PREFILLED_SYRINGE | INTRAMUSCULAR | Status: AC
  Administered 2020-04-06: 0.5 mL via INTRAMUSCULAR
  Filled 2020-04-06: qty 0.5

## 2020-04-06 MED ORDER — LISINOPRIL 10 MG PO TABS: 10.0000 mg | ORAL_TABLET | Freq: Every day | ORAL | 0 refills | Status: DC

## 2020-04-06 NOTE — Progress Notes (Signed)
Resting w/o acute distress or discomfort,appears asleep upon rounding, anticipates discharge today

## 2020-04-06 NOTE — Progress Notes (Signed)
[place] PHYSICAL MEDICINE & REHABILITATION PROGRESS NOTE   Subjective/Complaints: Ready for d/c today Practicing his exercises on his own He asks about diet and exercise when he returns home. He asked about size of his stroke.   ROS: Patient denies fever, rash, sore throat, blurred vision, nausea, vomiting, diarrhea, cough, shortness of breath or chest pain, joint or back pain, headache, or mood change.   Objective:   No results found. Recent Labs    04/04/20 0459  WBC 8.2  HGB 15.4  HCT 43.3  PLT 358   No results for input(s): NA, K, CL, CO2, GLUCOSE, BUN, CREATININE, CALCIUM in the last 72 hours.  Intake/Output Summary (Last 24 hours) at 04/06/2020 1020 Last data filed at 04/06/2020 0700 Gross per 24 hour  Intake 780 ml  Output -  Net 780 ml        Physical Exam: Vital Signs Blood pressure 109/85, pulse 63, temperature 98.1 F (36.7 C), resp. rate 14, height 5\' 8"  (1.727 m), weight 71.8 kg, SpO2 97 %. Gen: no distress, normal appearing HEENT: oral mucosa pink and moist, NCAT Cardio: Reg rate Chest: normal effort, normal rate of breathing Abd: soft, non-distended Ext: no edema Psych: pleasant, normal affect Skin: resolving ecchymoses and bruises Musc: No edema in extremities.  No tenderness in extremities. Neuro: Alert Aphasic, apraxic pattern  Motor: RUE: 4+to 5/5, more automatic RLE: 4+ to 5/5 proximal to distal Nl muscle tone   Assessment/Plan: 1. Functional deficits which require 3+ hours per day of interdisciplinary therapy in a comprehensive inpatient rehab setting.  Physiatrist is providing close team supervision and 24 hour management of active medical problems listed below.  Physiatrist and rehab team continue to assess barriers to discharge/monitor patient progress toward functional and medical goals  Care Tool:  Bathing    Body parts bathed by patient: Right arm,Left arm,Chest,Abdomen,Front perineal area,Buttocks,Right upper leg,Left  upper leg,Face,Right lower leg,Left lower leg   Body parts bathed by helper: Right lower leg,Left lower leg     Bathing assist Assist Level: Independent with assistive device     Upper Body Dressing/Undressing Upper body dressing Upper body dressing/undressing activity did not occur (including orthotics): N/A What is the patient wearing?: Pull over shirt    Upper body assist Assist Level: Independent with assistive device    Lower Body Dressing/Undressing Lower body dressing    Lower body dressing activity did not occur: N/A What is the patient wearing?: Pants,Underwear/pull up     Lower body assist Assist for lower body dressing: Independent with assitive device     Toileting Toileting    Toileting assist Assist for toileting: Independent with assistive device Assistive Device Comment: urinal   Transfers Chair/bed transfer  Transfers assist     Chair/bed transfer assist level: Independent     Locomotion Ambulation   Ambulation assist      Assist level: Independent Assistive device: No Device Max distance: >500'   Walk 10 feet activity   Assist     Assist level: Independent Assistive device: No Device   Walk 50 feet activity   Assist    Assist level: Independent Assistive device: No Device    Walk 150 feet activity   Assist    Assist level: Independent Assistive device: No Device    Walk 10 feet on uneven surface  activity   Assist     Assist level: Independent     Wheelchair     Assist Will patient use wheelchair at discharge?: No Type of  Wheelchair: Agricultural engineer assist level: Supervision/Verbal cueing Max wheelchair distance: 100    Wheelchair 50 feet with 2 turns activity    Assist        Assist Level: Supervision/Verbal cueing   Wheelchair 150 feet activity     Assist          Blood pressure 109/85, pulse 63, temperature 98.1 F (36.7 C), resp. rate 14, height 5\' 8"  (1.727 m),  weight 71.8 kg, SpO2 97 %.  Medical Problem List and Plan: 1.  Right hemiparesis with aphasia secondary to acute ischemic infarct left MCA distribution.  Status post TPA  Continue CIR, ELOS 2/16-->outpt therapy f/u   DC home today, f/u in clinic in 2 weeks.  2.  Antithrombotics: -DVT/anticoagulation: d/c Lovenox since ambulating >600 feet and has significant abdominal bruising             -antiplatelet therapy: Continue Aspirin 81 mg daily and Plavix 75 mg daily x3 weeks and aspirin alone  -may be someone who tends to bruise with this regimen  -hgb and plts continue to be normal on 2/14.  3. Pain Management: Tylenol as needed 4. Mood: Zoloft 50 mg daily.  Provide emotional support             -antipsychotic agents: N/A  -on adderall pta  5. Neuropsych: This patient is ?fully capable of making decisions on his own behalf. 6. Skin/Wound Care: Routine skin checks 7. Fluids/Electrolytes/Nutrition:               -recent labs ok. 8. PFO identified on TEE.  Discussed results of Echo with patient. Follow-up with Dr. Burt Knack as outpatient. Appreciate RN scheduling this follow-up for them on 3/24.  9. Hypertension.  No antihypertensive medications prior to admission.     2/12 -increased lisinopril to 10mg  on 2/11 with improvement  2/15: well controlled, continue Lisinopril.  10. Urinary retention: emptying bladder well now.  2/15 -dc urecholine 11.  Dyslipidemia: Lipitor 12. Depression: Continue Zoloft 13. Elevated LFT's  LFTs now almost within normal range   >30 minutes spent in discharge of patient including review of medications and follow-up appointments, physical examination, and in answering all patient's questions     LOS: 14 days A FACE TO [place] 04/06/2020, 10:20 AM

## 2020-04-06 NOTE — Discharge Instructions (Signed)
Inpatient Rehab Discharge Instructions  Cody Henson Discharge date and time:  04/07/19  Activities/Precautions/ Functional Status: Activity: no lifting, driving, or strenuous exercise till cleared by MD Diet: low fat, low cholesterol diet Wound Care: none needed    Functional status:  ___ No restrictions     ___ Walk up steps independently ___ 24/7 supervision/assistance   ___ Walk up steps with assistance _X__ Intermittent supervision/assistance  ___ Bathe/dress independently ___ Walk with walker     ___ Bathe/dress with assistance ___ Walk Independently    ___ Shower independently ___ Walk with assistance    _X_ Shower with supervision _X__ No alcohol     ___ Return to work/school ________   COMMUNITY REFERRALS UPON DISCHARGE:     Outpatient: PT   OT   ST             Agency: [facility]  Phone: [phone]             Appointment Date/Time:*Please expect follow-up within 7-10 business days to schedule appointment. If you have not received follow-up, be sure to contact the site directly.*  Medical Equipment/Items Ordered: no equipment                                                  Agency/Supplier: N/A  Special Instructions:   STROKE/TIA DISCHARGE INSTRUCTIONS SMOKING Cigarette smoking nearly doubles your risk of having a stroke & is the single most alterable risk factor  If you smoke or have smoked in the last 12 months, you are advised to quit smoking for your health.  Most of the excess cardiovascular risk related to smoking disappears within a year of stopping.  Ask you doctor about anti-smoking medications  [place] Quit Line: 1-800-QUIT NOW  Free Smoking Cessation Classes [phone]  CHOLESTEROL Know your levels; limit fat & cholesterol in your diet  Lipid Panel     Component Value Date/Time   CHOL 212 (H) 03/17/2020 0344   TRIG 113 03/17/2020 0344   HDL 40 (L) 03/17/2020 0344   CHOLHDL 5.3 03/17/2020 0344   VLDL 23 03/17/2020 0344   LDLCALC 149 (H)  03/17/2020 0344      Many patients benefit from treatment even if their cholesterol is at goal.  Goal: Total Cholesterol (CHOL) less than 160  Goal:  Triglycerides (TRIG) less than 150  Goal:  HDL greater than 40  Goal:  LDL (LDLCALC) less than 100   BLOOD PRESSURE American Stroke Association blood pressure target is less that 120/80 mm/Hg  Your discharge blood pressure is:  BP: 109/85  Monitor your blood pressure  Limit your salt and alcohol intake  Many individuals will require more than one medication for high blood pressure  DIABETES (A1c is a blood sugar average for last 3 months) Goal HGBA1c is under 7% (HBGA1c is blood sugar average for last 3 months)  Diabetes: No known diagnosis of diabetes    Lab Results  Component Value Date   HGBA1C 5.1 03/17/2020     Your HGBA1c can be lowered with medications, healthy diet, and exercise.  Check your blood sugar as directed by your physician  Call your physician if you experience unexplained or low blood sugars.  PHYSICAL ACTIVITY/REHABILITATION Goal is 30 minutes at least 4 days per week  Activity: No driving, Therapies: see above Return to work: To be decided  on follow up.   Activity decreases your risk of heart attack and stroke and makes your heart stronger.  It helps control your weight and blood pressure; helps you relax and can improve your mood.  Participate in a regular exercise program.  Talk with your doctor about the best form of exercise for you (dancing, walking, swimming, cycling).  DIET/WEIGHT Goal is to maintain a healthy weight  Your discharge diet is:  Diet Order            Diet regular Room service appropriate? Yes; Fluid consistency: Thin  Diet effective now                liquids Your height is:  Height: 5\' 8"  (172.7 cm) Your current weight is: Weight: 71.8 kg Your Body Mass Index (BMI) is:  BMI (Calculated): 24.07  Following the type of diet specifically designed for you will help prevent  another stroke.  You are at goal weight  Your goal Body Mass Index (BMI) is 19-24.  Healthy food habits can help reduce 3 risk factors for stroke:  High cholesterol, hypertension, and excess weight.  RESOURCES Stroke/Support Group:  Call [phone]   STROKE EDUCATION PROVIDED/REVIEWED AND GIVEN TO PATIENT Stroke warning signs and symptoms How to activate emergency medical system (call 911). Medications prescribed at discharge. Need for follow-up after discharge. Personal risk factors for stroke. Pneumonia vaccine given:  Flu vaccine given:  My questions have been answered, the writing is legible, and I understand these instructions.  I will adhere to these goals & educational materials that have been provided to me after my discharge from the hospital.     My questions have been answered and I understand these instructions. I will adhere to these goals and the provided educational materials after my discharge from the hospital.  Patient/Caregiver Signature _______________________________ Date __________  Clinician Signature _______________________________________ Date __________  Please bring this form and your medication list with you to all your follow-up doctor's appointments.

## 2020-04-06 NOTE — Discharge Summary (Signed)
Physician Discharge Summary  Patient ID: Cody Henson MRN: [id number] DOB/AGE: [birth year] 45 y.o.  Admit date: 03/23/2020 Discharge date: 04/06/2020  Discharge Diagnoses:  Principal Problem:   Left middle cerebral artery stroke [facility]) Active Problems:   Blood pressure increase diastolic   Transaminitis   Hemiparesis affecting dominant side as late effect of stroke [facility])   Discharged Condition: stable   Significant Diagnostic Studies: N/A   Labs:  Comprehensive Metabolic Panel: . CMP Latest Ref Rng & Units 03/28/2020 03/24/2020 03/23/2020  Glucose 70 - 99 mg/dL 92 89 -  BUN 6 - 20 mg/dL 15 16 -  Creatinine 0.61 - 1.24 mg/dL 1.04 1.14 0.99  Sodium 135 - 145 mmol/L 141 138 -  Potassium 3.5 - 5.1 mmol/L 3.9 4.0 -  Chloride 98 - 111 mmol/L 105 100 -  CO2 22 - 32 mmol/L 25 28 -  Calcium 8.9 - 10.3 mg/dL 9.1 9.5 -  Total Protein 6.5 - 8.1 g/dL 6.3(L) 6.8 -  Total Bilirubin 0.3 - 1.2 mg/dL 0.6 0.8 -  Alkaline Phos 38 - 126 U/L 65 69 -  AST 15 - 41 U/L 36 44(H) -  ALT 0 - 44 U/L 55(H) 60(H) -    CBC: CBC Latest Ref Rng & Units 04/04/2020 03/28/2020 03/24/2020  WBC 4.0 - 10.5 K/uL 8.2 8.0 8.3  Hemoglobin 13.0 - 17.0 g/dL 15.4 13.9 15.2  Hematocrit 39.0 - 52.0 % 43.3 41.1 45.5  Platelets 150 - 400 K/uL 358 304 296    CBG: No results for input(s): GLUCAP in the last 168 hours.  Brief HPI:   Cody Henson is a 45 y.o. male with history of depression who was admitted on 03/16/20 with right sided weakness, facial weakness and aphasia. CT head showed evolving hypodensity in mid/posterior left frontal lobe and CTA was negative for LVO. UDS showed THC. He received tPA and follow up MRI showed patchy multifocal acute ischemic infarcts in left cerebral hemisphere without hemorrhagic transformation. TEE done revealing PFO with + bubble study and right to left shunt. BLE dopplers were negative for DVT. Hypercoagulopathy studies negative.  He was started on DAPT with recommendations to d/c Plavix after 3  weeks. Hospital course significant for urinary retention and foley was placed briefly with addition of urecholine. Patient continued to have deficits due to right sided weakness with aphasia and apraxia affecting ADLs and mobility. CIR was recommended due to functional decline.    Hospital Course: Cody Henson was admitted to rehab 03/23/2020 for inpatient therapies to consist of PT, ST and OT at least three hours five days a week. Past admission physiatrist, therapy team and rehab RN have worked together to provide customized collaborative inpatient rehab. He was maintained on DAPT during his stay and is tolerating this without SE. His blood pressures were monitored on TID basis and noted to be labile. Lisinopril was added and titrated up to 10 mg daily with good control. Serial check of lytes showed that renal status is stable. Follow up CBC showed transient drop which has resolved without signs of bleeding. Foley was removed on 02/03  and voiding function monitored with PVR checks. He has been voiding without difficulty and urecholine was discontinued. Mood has been stable and he has been motivated and making good gains during his stay. Dr. Leslie Dales has followed up for neuropsychological evaluation and mood noted to be stable. He has had decrease in apraxia and ataxia and has progressed to modified independent level. He will continue to receive follow up outpatient  PT, OT and ST at [facility] after discharge.    Rehab course: During patient's stay in rehab weekly team conferences were held to monitor patient's progress, set goals and discuss barriers to discharge. At admission, patient required mod assist with basic self care tasks and with mobility.  He demonstrated oral motor apraxia with higher level cognitive deficits.issues. He  has had improvement in activity tolerance, balance, postural control as well as ability to compensate for deficits. He has had improvement in functional use RUE  and RLE as well as  improvement in awareness. He is able to complete ADL tasks at modified independent level. He is modified independent for transfers and is able to ambulate 500' without AD. He requires supervision with higher level cognitive tasks and expressio/ communication has improved with use of compensatory strategies and addition time needed for word finding deficits. Family education was completed with wife.   Disposition: Home  Diet: Heart Healthy.   Special Instructions: 1. No driving or strenuous activity till cleared by MD. 2. Plavix to discontinue after one week.   Discharge Instructions    Ambulatory referral to Occupational Therapy   Complete by: As directed    Eval and treat   Ambulatory referral to Physical Medicine Rehab   Complete by: As directed    4 week follow up appt   Ambulatory referral to Physical Therapy   Complete by: As directed    Eval and treat   Ambulatory referral to Speech Therapy   Complete by: As directed    Eval and treat     Allergies as of 04/06/2020      Reactions   Albumen, Egg Itching   Other Hives   Chicken meat, [place]      Medication List    STOP taking these medications   amphetamine-dextroamphetamine 10 MG tablet Commonly known as: ADDERALL   enoxaparin 40 MG/0.4ML injection Commonly known as: LOVENOX     TAKE these medications   acetaminophen 325 MG tablet Commonly known as: TYLENOL Take 650 mg by mouth every 6 (six) hours as needed for mild pain or headache.   aspirin EC 81 MG tablet Take 1 tablet (81 mg total) by mouth daily. Swallow whole.   atorvastatin 80 MG tablet Commonly known as: LIPITOR Take 1 tablet (80 mg total) by mouth daily.   clopidogrel 75 MG tablet Commonly known as: PLAVIX Take 1 tablet (75 mg total) by mouth daily.   lisinopril 10 MG tablet Commonly known as: ZESTRIL Take 1 tablet (10 mg total) by mouth daily.   multivitamin tablet Take 1 tablet by mouth daily.   pantoprazole 40 MG tablet Commonly known  as: PROTONIX Take 1 tablet (40 mg total) by mouth daily.   senna-docusate 8.6-50 MG tablet Commonly known as: Senokot-S Take 1 tablet by mouth at bedtime as needed for mild constipation.   sertraline 50 MG tablet Commonly known as: ZOLOFT Take 50 mg by mouth daily.       Follow-up Information    Meredith Staggers, MD Follow up.   Specialty: Physical Medicine and Rehabilitation Why: Office will call you with follow up appointment Contact information: [place] [address] [phone]        [facility]. Call.   Why: If office has not contacted you in 4-5 days---stroke follow up Contact information: [address] [address]     [place] [address] [phone]       Sherren Mocha, MD Follow  up on 05/19/2020.   Specialty: Cardiology Why: Keep follow up appointment Contact information: [address] [place] [address] [phone]        Camillia Herter, NP Follow up.   Specialty: Nurse Practitioner Why: Appointment at 4:10 pm/Nurse will cal wife's cell phone.  Contact information: [address] [place] [address] [phone]               Signed: Bary Leriche 04/06/2020, 4:06 PM

## 2020-04-06 NOTE — Progress Notes (Signed)
Patient to discharge today ; Wife in room ; Pam PA for discharge instructions. Flu vac egg free given . Teaching done by pharmacist.

## 2020-04-06 NOTE — Progress Notes (Signed)
Inpatient Rehabilitation Care Coordinator Discharge Note  The overall goal for the admission was met for:   Discharge location: Yes. D/c to home with 24/7 care from wife.   Length of Stay: Yes. 13 days.   Discharge activity level: Yes. Mod I.   Home/community participation: Yes. Limited.   Services provided included: MD, RD, PT, OT, SLP, RN, CM, TR, Pharmacy, Neuropsych and SW  Financial Services: Private Insurance: BCBS  Choices offered to/list presented to:yes  Follow-up services arranged: Outpatient: [facility] for outpatient PT/OT/SLP and DME: No DME  Comments (or additional information):  Patient/Family verbalized understanding of follow-up arrangements: Yes  Individual responsible for coordination of the follow-up plan: Pt to have assistance from wife. Contact Katie #[phone]  Confirmed correct DME delivered: Cody Henson 04/06/2020    Cody Henson 

## 2020-04-11 ENCOUNTER — Encounter: Payer: Self-pay | Admitting: Family

## 2020-04-11 ENCOUNTER — Telehealth: Payer: BC Managed Care – PPO | Admitting: Family

## 2020-04-11 ENCOUNTER — Other Ambulatory Visit: Payer: Self-pay

## 2020-04-11 DIAGNOSIS — I69359 Hemiplegia and hemiparesis following cerebral infarction affecting unspecified side: Secondary | ICD-10-CM

## 2020-04-11 DIAGNOSIS — I63512 Cerebral infarction due to unspecified occlusion or stenosis of left middle cerebral artery: Secondary | ICD-10-CM | POA: Diagnosis not present

## 2020-04-11 DIAGNOSIS — Q211 Atrial septal defect: Secondary | ICD-10-CM

## 2020-04-11 DIAGNOSIS — I10 Essential (primary) hypertension: Secondary | ICD-10-CM

## 2020-04-11 DIAGNOSIS — Z7689 Persons encountering health services in other specified circumstances: Secondary | ICD-10-CM

## 2020-04-11 DIAGNOSIS — Q2112 Patent foramen ovale: Secondary | ICD-10-CM

## 2020-04-11 NOTE — Progress Notes (Signed)
Establish care Stroke early February  Atorvastatin and Lisinopril need refill

## 2020-04-11 NOTE — Progress Notes (Signed)
Virtual Visit via Telephone Note  I connected with Cody Henson, on 04/11/2020 at 4:48 PM by telephone due to the COVID-19 pandemic and verified that I am speaking with the correct person using two identifiers.  Due to current restrictions/limitations of in-office visits due to the COVID-19 pandemic, this scheduled clinical appointment was converted to a telehealth visit.   Consent: I discussed the limitations, risks, security and privacy concerns of performing an evaluation and management service by telephone and the availability of in person appointments. I also discussed with the patient that there may be a patient responsible charge related to this service. The patient expressed understanding and agreed to proceed.  Location of Patient: Home  Location of Provider: [facility] [place] participating in Telemedicine visit: Tula Nakayama, NP [place] Else, [place]  History of Present Illness: Cody Henson is a 45 year-old male who presents to establish care.  Visit 03/16/2020 - 03/23/2020 at the [facility] per MD note: Hospital Course: Mr. Tagle was admitted to the neurologic ICU for intensive monitoring post tPA administration. Stroke work up continued. MRI showed left MCA scattered moderate large infarct with associated mild petechial hemorrhage without hemorrhagic transformation. 2D echo EF 60-65%, LE venous Doppler no DVT. TEE showed Positive PFO with bubbles crossing the atrial septum within 1-2 heart beats, Grade 2 during rest and Grade 3 on valsalva. Right to left shunt. Stroke labs were obtained. LDL was 149 and A1c was normal at  5.1. UDS positive for THC. Hypercoagulable work up is thus far negative with Prothrombin Gene Mutation still pending. DAPT and statin, Lipitor 80mg , were initiated. He remained neurologically and hemodynamically stable. He was transferred to the floor and progressed well with improvement noted in his  aphasia. Urinary retention was noted with urecholine initiated, foley currently in place. Voiding trial once settled in CIR appropriate. Physical, speech and occupational therapists provided evaluation, treatment and discharge recommendations. Minimal swallow impact was noted. He was able to tolerate a regular diet. CIR was recommended and arranged to address post stroke deficits. Patient was discharged to CIR in good condition.   Discharge Plan:  Disposition:  Transfer to [facility] for ongoing PT, OT and ST  Aspirin 81 mg and Plavix 75mg  DAPT for 3 weeks and then ASA alone  Recommend ongoing stroke risk factor control by Primary Care Physician at time of discharge from inpatient rehabilitation.  Follow-up PCP Patient, No Pcp Per in 2 weeks following discharge from rehab.  Follow-up in [facility] in 4 weeks following discharge from rehab, office to schedule an appointment.   PFO: Dr. Burt Knack interventional cardiologist who will see the patient as an outpatient and discuss closure, arranged by Dr. Leonie Man   Visit 03/23/2020 - 04/06/2020 at [facility] per PA note: Rehab Course: During patient's stay in rehab weekly team conferences were held to monitor patient's progress, set goals and discuss barriers to discharge. At admission, patient required mod assist with basic self care tasks and with mobility.  He demonstrated oral motor apraxia with higher level cognitive deficits.issues. He  has had improvement in activity tolerance, balance, postural control as well as ability to compensate for deficits. He has had improvement in functional use RUE  and RLE as well as improvement in awareness. He is able to complete ADL tasks at modified independent level. He is modified independent for transfers and is able to ambulate 500' without  AD. He requires supervision with higher level cognitive tasks and  expressio/ communication has improved with use of compensatory strategies and addition time needed for word finding deficits. Family education was completed with wife.   Disposition: Home  Diet: Heart Healthy.   Special Instructions: 1. No driving or strenuous activity till cleared by MD. 2. Plavix to discontinue after one week.   Follow-up: Physical Medicine and Rehabilitation  [facility]  Cardiology  Primary Provider    STROKE AND HYPERTENSION FOLLOW-UP 04/11/2020: Today reports has improved some since hospital discharge. Still having right leg and right arm numbness. Does have appointments scheduled with outpatient therapy. Scheduled to see Cardiologist and Neurology next month.  Currently taking: see medication list  Med Adherence: [x]  Yes    []  No Medication side effects: []  Yes    [x]  No  Adherence with salt restriction (low-salt diet): [x]  Yes    []  No Exercise: Yes [x]  from physical therapy Home Monitoring?: [x]  Yes    []  No Monitoring Frequency: [x]  Yes    []  No Home BP results range: [x]  Yes   122/88 119/73 120/82  132/79  Smoking []  Yes [x]  No SOB? []  Yes    [x]  No Chest Pain?: []  Yes    [x]  No Leg swelling?: []  Yes    [x]  No Headaches?: []  Yes    [x]  No Dizziness? []  Yes    [x]  No  Past Medical History:  Diagnosis Date  . Allergy    Phreesia 04/10/2020  . Depressed   . Hyperlipidemia    Phreesia 04/10/2020  . Hypertension    Phreesia 04/10/2020  . Neuromuscular disorder ([place])    Phreesia 04/10/2020  . Stroke [facility])    Phreesia 04/10/2020   Allergies  Allergen Reactions  . Albumen, Egg Itching  . Other Hives    Chicken meat, [place]  . Eggs Or Egg-Derived Products Itching    Current Outpatient Medications on File Prior to Visit  Medication Sig Dispense Refill  . aspirin EC 81 MG tablet Take 1 tablet (81 mg total) by mouth daily. Swallow whole. 150 tablet 2  . atorvastatin (LIPITOR) 80 MG tablet Take 1 tablet (80 mg total)  by mouth daily. 30 tablet 1  . lisinopril (ZESTRIL) 10 MG tablet Take 1 tablet (10 mg total) by mouth daily. 30 tablet 0  . Multiple Vitamin (MULTIVITAMIN) tablet Take 1 tablet by mouth daily.    Marland Kitchen senna-docusate (SENOKOT-S) 8.6-50 MG tablet Take 1 tablet by mouth at bedtime as needed for mild constipation.    . sertraline (ZOLOFT) 50 MG tablet Take 50 mg by mouth daily.     No current facility-administered medications on file prior to visit.    Observations/Objective: Alert and oriented x 3. Not in acute distress. Physical examination not completed as this is a telemedicine visit.  Assessment and Plan: 1. Encounter to establish care: - Patient presents today to establish care.  - Return for annual physical examination, labs, and health maintenance. Arrive fasting meaning having had no food and/or nothing to drink for at least 8 hours prior to appointment. However, you may drink water prior to appointment. Also, take prescribed medications as normal.   2. Hemiparesis affecting dominant side as late effect of stroke ([place]): 3. Left middle cerebral artery stroke [facility]): - Stable since discharge from [facility] on 04/06/2020. - Plavix was discontinued after 3 weeks.  - Continue Aspirin and Atorvastatin as prescribed.  - Keep appointments with [facility]. - Keep appointments with physical therapy, speech therapy, and occupational therapy. - Follow-up with primary provider as needed.  - Patient was given clear instructions to go to Emergency Department or return to medical center if symptoms don't improve, worsen, or new problems develop.The patient verbalized understanding.  4. PFO (patent foramen ovale): -  Stable since discharge from [place] on 04/06/2020. - Keep appointments with Cardiologist.   5. Essential hypertension: - Stable. - Continue Lisinopril as prescribed.  Counseled on blood pressure goal of less than  130/80, low-sodium, DASH diet, medication compliance, 150 minutes of moderate intensity exercise per week as tolerated. Discussed medication compliance, adverse effects. - Follow-up with primary provider as scheduled.  - lisinopril (ZESTRIL) 10 MG tablet; Take 1 tablet (10 mg total) by mouth daily.  Dispense: 30 tablet; Refill: 0    Follow Up Instructions: Return for annual physical exam.    Patient was given clear instructions to go to Emergency Department or return to medical center if symptoms don't improve, worsen, or new problems develop.The patient verbalized understanding.  I discussed the assessment and treatment plan with the patient. The patient was provided an opportunity to ask questions and all were answered. The patient agreed with the plan and demonstrated an understanding of the instructions.   The patient was advised to call back or seek an in-person evaluation if the symptoms worsen or if the condition fails to improve as anticipated.  I provided 15 minutes total of non-face-to-face time during this encounter including median intraservice time, reviewing previous notes, labs, imaging, medications, management and patient verbalized understanding.    Camillia Herter, NP  [facility] [place], [place] 04/11/2020, 4:48 PM

## 2020-04-12 ENCOUNTER — Ambulatory Visit: Payer: BC Managed Care – PPO | Attending: Physical Medicine and Rehabilitation | Admitting: Speech Pathology

## 2020-04-12 ENCOUNTER — Ambulatory Visit: Payer: BC Managed Care – PPO | Admitting: Occupational Therapy

## 2020-04-12 ENCOUNTER — Ambulatory Visit: Payer: BC Managed Care – PPO

## 2020-04-12 ENCOUNTER — Other Ambulatory Visit: Payer: Self-pay

## 2020-04-12 DIAGNOSIS — I69359 Hemiplegia and hemiparesis following cerebral infarction affecting unspecified side: Secondary | ICD-10-CM | POA: Diagnosis not present

## 2020-04-12 DIAGNOSIS — R269 Unspecified abnormalities of gait and mobility: Secondary | ICD-10-CM | POA: Insufficient documentation

## 2020-04-12 DIAGNOSIS — M6281 Muscle weakness (generalized): Secondary | ICD-10-CM | POA: Diagnosis not present

## 2020-04-12 DIAGNOSIS — R278 Other lack of coordination: Secondary | ICD-10-CM | POA: Insufficient documentation

## 2020-04-12 DIAGNOSIS — R2681 Unsteadiness on feet: Secondary | ICD-10-CM | POA: Diagnosis not present

## 2020-04-12 DIAGNOSIS — I63512 Cerebral infarction due to unspecified occlusion or stenosis of left middle cerebral artery: Secondary | ICD-10-CM | POA: Insufficient documentation

## 2020-04-12 DIAGNOSIS — R41841 Cognitive communication deficit: Secondary | ICD-10-CM | POA: Diagnosis not present

## 2020-04-12 DIAGNOSIS — R4701 Aphasia: Secondary | ICD-10-CM | POA: Diagnosis not present

## 2020-04-12 DIAGNOSIS — R482 Apraxia: Secondary | ICD-10-CM | POA: Insufficient documentation

## 2020-04-12 NOTE — Therapy (Signed)
[place] MAIN [facility] SERVICES [address] [place], [place], [address] Phone: [phone]   Fax:  [phone]  Physical Therapy Treatment  Patient Details  Name: Cody Henson MRN: [id number] Date of Birth: 14-Feb-1976 No data recorded  Encounter Date: 04/12/2020   PT End of Session - 04/12/20 1309    Visit Number 1    Number of Visits 25    Date for PT Re-Evaluation 07/05/20    PT Start Time 0200    PT Stop Time 0240    PT Time Calculation (min) 40 min    Equipment Utilized During Treatment Gait belt    Activity Tolerance Patient tolerated treatment well    Behavior During Therapy WFL for tasks assessed/performed           Past Medical History:  Diagnosis Date  . Allergy    Phreesia 04/10/2020  . Depressed   . Hyperlipidemia    Phreesia 04/10/2020  . Hypertension    Phreesia 04/10/2020  . Neuromuscular disorder ([place])    Phreesia 04/10/2020  . Stroke [facility])    Phreesia 04/10/2020    Past Surgical History:  Procedure Laterality Date  . BUBBLE STUDY  03/21/2020   Procedure: BUBBLE STUDY;  Surgeon: [name], MD;  Location: [place];  Service: Cardiology;;  . TEE WITHOUT CARDIOVERSION N/A 03/21/2020   Procedure: TRANSESOPHAGEAL ECHOCARDIOGRAM (TEE);  Surgeon: [name], MD;  Location: [place];  Service: Cardiology;  Laterality: N/A;    There were no vitals filed for this visit.   Subjective Assessment - 04/12/20 1325    Subjective from medial records Cody Henson is a 45 y.o. male with history of depression who was admitted on 03/16/20 with right sided weakness, facial weakness and aphasia. CT head showed evolving hypodensity in mid/posterior left frontal lobe and CTA was negative for LVO. UDS showed THC. He received tPA and follow up MRI showed patchy multifocal acute ischemic infarcts in left cerebral hemisphere without hemorrhagic transformation.  The patient reports he is doing pretty good overall with  right sided weakness    Pertinent History HTN    Limitations Walking;Standing    How long can you sit comfortably? unrestricted    How long can you stand comfortably? 15 mins    How long can you walk comfortably? 1 hour no AD    Patient Stated Goals be able to drive at type    Currently in Pain? No/denies    Pain Score 0-No pain         BP 125/78 SpO2 98% HR 85    PAIN:  0/10   POSTURE: Seated posture WNL equal weight shifting Standing posture WNL equal weight shifting   PROM/AROM: LEs WNL  STRENGTH:  Graded on a 0-5 scale Muscle Group Left Right  Hip Flex 5/5 4/5  Hip Abd 5/5 5/5  Hip Add 5/5 5/5  Hip Ext 5/5 5/5  Hip IR/ER 5/5 4/5  Knee Flex 5/5 5/5  Knee Ext 5/5 5/5  Ankle DF 5/5 5/5  Ankle PF 5/5 5/5   SENSATION:   NEUROLOGICAL SCREEN: (2+ unless otherwise noted.) N=normal  Ab=abnormal   Level Dermatome R L  L3 Lower thigh/med.knee AB N  L4 Medial leg/lat thigh AB N  L5 Lat. leg & dorsal foot AB N  S1 post/lat foot/thigh/leg AB N  S2 Post./med. thigh & leg AB N    SOMATOSENSORY:  Any N & T in extremities or weakness: reports :  Sensation           Intact      Diminished         Absent  Light touch LEs   X right LE                           COORDINATION:         Heel Shin Slide Test: wnl         Toe taps/ bilateral and alternating: wnl   SPECIAL TESTS: Cranial Nerves/Vision: slight facial droop on right  FUNCTIONAL MOBILITY:   BALANCE: Static Sitting Balance  Normal Able to maintain balance against maximal resistance X  Good Able to maintain balance against moderate resistance   Good-/Fair+ Accepts minimal resistance   Fair Able to sit unsupported without balance loss and without UE support   Poor+ Able to maintain with Minimal assistance from individual or chair   Poor Unable to maintain balance-requires mod/max support from individual or chair    Static Standing Balance  Normal Able to maintain standing balance against maximal  resistance   Good Able to maintain standing balance against moderate resistance   Good-/Fair+ Able to maintain standing balance against minimal resistance X  Fair Able to stand unsupported without UE support and without LOB for 1-2 min   Fair- Requires Min A and UE support to maintain standing without loss of balance   Poor+ Requires mod A and UE support to maintain standing without loss of balance   Poor Requires max A and UE support to maintain standing balance without loss    Dynamic Sitting Balance  Normal Able to sit unsupported and weight shift across midline maximally X  Good Able to sit unsupported and weight shift across midline moderately   Good-/Fair+ Able to sit unsupported and weight shift across midline minimally   Fair Minimal weight shifting ipsilateral/front, difficulty crossing midline   Fair- Reach to ipsilateral side and unable to weight shift   Poor + Able to sit unsupported with min A and reach to ipsilateral side, unable to weight shift   Poor Able to sit unsupported with mod A and reach ipsilateral/front-can't cross midline    Standing Dynamic Balance  Normal Stand independently unsupported, able to weight shift and cross midline maximally X  Good Stand independently unsupported, able to weight shift and cross midline moderately   Good-/Fair+ Stand independently unsupported, able to weight shift across midline minimally   Fair Stand independently unsupported, weight shift, and reach ipsilaterally, loss of balance when crossing midline   Poor+ Able to stand with Min A and reach ipsilaterally, unable to weight shift   Poor Able to stand with Mod A and minimally reach ipsilaterally, unable to cross midline.      GAIT: Weak push off on right otherwise WFL  OUTCOME MEASURES: TEST Outcome  5 times sit<>stand 6.21sec  10 meter walk test       2.18  m/s  FGA 22/30- medium fall risk  FOTO 56%- goal 72%    [facility] PT Assessment - 04/12/20 0001      Assessment    Medical Diagnosis CVA    Onset Date/Surgical Date 03/16/20    Hand Dominance Right    Next MD Visit 05/05/2020    Prior Therapy in the hospital      Balance Screen   Has the patient fallen in the past 6 months Yes    How many times? 1  Has the patient had a decrease in activity level because of a fear of falling?  Yes    Is the patient reluctant to leave their home because of a fear of falling?  No      Home Environment   Living Environment Private residence    Living Arrangements Spouse/significant other;Children    Available Help at Discharge Family    Type of [place] to enter    Entrance Stairs-Number of Steps 5    Entrance Stairs-Rails Can reach both;None    [place] One level      Prior Function   Level of Independence Independent with household mobility without device;Independent with transfers      Cognition   Overall Cognitive Status Within Functional Limits for tasks assessed      Functional Gait  Assessment   Gait assessed  Yes    Gait Level Surface Walks 20 ft in less than 5.5 sec, no assistive devices, good speed, no evidence for imbalance, normal gait pattern, deviates no more than 6 in outside of the 12 in walkway width.    Change in Gait Speed Able to change speed, demonstrates mild gait deviations, deviates 6-10 in outside of the 12 in walkway width, or no gait deviations, unable to achieve a major change in velocity, or uses a change in velocity, or uses an assistive device.    Gait with Horizontal Head Turns Performs head turns smoothly with slight change in gait velocity (eg, minor disruption to smooth gait path), deviates 6-10 in outside 12 in walkway width, or uses an assistive device.    Gait with Vertical Head Turns Performs task with slight change in gait velocity (eg, minor disruption to smooth gait path), deviates 6 - 10 in outside 12 in walkway width or uses assistive device    Gait and Pivot Turn Pivot turns safely within 3  sec and stops quickly with no loss of balance.    Step Over Obstacle Is able to step over one shoe box (4.5 in total height) without changing gait speed. No evidence of imbalance.    Gait with Narrow Base of Support Is able to ambulate for 10 steps heel to toe with no staggering.    Gait with Eyes Closed Walks 20 ft, slow speed, abnormal gait pattern, evidence for imbalance, deviates 10-15 in outside 12 in walkway width. Requires more than 9 sec to ambulate 20 ft.    Ambulating Backwards Walks 20 ft, no assistive devices, good speed, no evidence for imbalance, normal gait    Steps Two feet to a stair, must use rail.    Total Score 22                                   PT Short Term Goals - 04/12/20 1310      PT SHORT TERM GOAL #1   Title Patient will be independent in home exercise program to improve strength/mobility for better functional independence with ADLs.    Status New    Target Date 07/05/20             PT Long Term Goals - 04/12/20 1311      PT LONG TERM GOAL #1   Title Patient will increase FOTO score to equal to or greater than 72% to demonstrate statistically significant improvement in mobility and quality of life.    Baseline 04/12/20  56%    Status New    Target Date 07/05/20      PT LONG TERM GOAL #2   Title Patient will increase Functional Gait Assessment score to >26/30 as to reduce fall risk and improve dynamic gait safety with community ambulation.    Baseline 04/12/20 FGA 22/30    Status New    Target Date 07/05/20      PT LONG TERM GOAL #3   Title Patient will be able to navigate full flight of stairs independently with reciprocal pattern  to get to next level of home.    Baseline 04/12/20  step to pattern bilateral UE support    Status New    Target Date 07/05/20      PT LONG TERM GOAL #4   Status --    Target Date --                 Plan - 04/12/20 1313    Clinical Impression Statement The patient is pleasant 45 year  old male presenting s/p CVA 03/16/20.  The patient presents with mild muscle weakness, gait impairments and decreased balanced.  The patient scored 22/30 in FGA indicating medium fall risk    Personal Factors and Comorbidities Comorbidity 2    Comorbidities HTN, depression    Examination-Activity Limitations Locomotion Level;Stairs    Examination-Participation Restrictions Yard Work;Driving;Community Activity    Stability/Clinical Decision Making Evolving/Moderate complexity    Clinical Decision Making Moderate    Rehab Potential Good    PT Frequency 2x / week    PT Duration 12 weeks    PT Treatment/Interventions ADLs/Self Care Home Management;Gait training;Stair training;Functional mobility training;Therapeutic activities;Therapeutic exercise;Balance training;Neuromuscular re-education;Patient/family education;Passive range of motion    PT Next Visit Plan initiate strengthening and balance activites    PT Home Exercise Plan issue next visit    Recommended Other Services pt seeing OT and SLP    Consulted and Agree with Plan of Care Patient           Patient will benefit from skilled therapeutic intervention in order to improve the following deficits and impairments:  Abnormal gait,Decreased balance,Decreased endurance,Decreased mobility,Difficulty walking,Decreased range of motion,Improper body mechanics,Decreased activity tolerance,Decreased strength,Impaired flexibility,Postural dysfunction  Visit Diagnosis: Left middle cerebral artery stroke (H[facility])  Hemiparesis affecting dominant side as late effect of stroke ([facility])  Unsteadiness on feet  Abnormality of gait and mobility  Muscle weakness (generalized)     Problem List Patient Active Problem List   Diagnosis Date Noted  . Transaminitis   . Hemiparesis affecting dominant side as late effect of stroke ([place])   . Blood pressure increase diastolic   . Left middle cerebral artery stroke ([place]) 03/23/2020  . Urinary retention   .  Dyslipidemia   . Benign essential HTN   . Acute ischemic stroke ([place])   . Global aphasia   . Depression   . Hyperglycemia   . Right hemiparesis ([place])   . Aphasia   . Elevated blood pressure reading   . Stroke (cerebrum) ([place]) 03/17/2020  . Enthesopathy of hip region on both sides 01/26/2019  . Chronic right shoulder pain 12/10/2014  . Scapular dyskinesis 07/29/2014  . Bursitis of right shoulder 03/01/2014    Hal Morales PT, DPT 04/12/2020, 3:08 PM  [place] MAIN [facility] SERVICES [address] [place], [place], [address] Phone: [phone]   Fax:  [phone]  Name: Cody Henson MRN: [id number] Date of Birth: [birth year]

## 2020-04-12 NOTE — Therapy (Signed)
[place] MAIN [facility] SERVICES [address] [place], [place], [address] Phone: [phone]   Fax:  [phone]  Speech Language Pathology Evaluation  Patient Details  Name: Cody Henson MRN: [id number] Date of Birth: Oct 27, 1975 Referring Provider (SLP): Reesa Chew, [place]   Encounter Date: 04/12/2020   End of Session - 04/12/20 1658    Visit Number 1    Number of Visits 25    Date for SLP Re-Evaluation 07/05/20    Authorization Type [employer]    Authorization Time Period 04/12/2020 thru 07/05/2020    Authorization - Visit Number 1    Progress Note Due on Visit 10    SLP Start Time 1300    SLP Stop Time  1400    SLP Time Calculation (min) 60 min    Activity Tolerance Patient tolerated treatment well           Past Medical History:  Diagnosis Date  . Allergy    Phreesia 04/10/2020  . Depressed   . Hyperlipidemia    Phreesia 04/10/2020  . Hypertension    Phreesia 04/10/2020  . Neuromuscular disorder ([place])    Phreesia 04/10/2020  . Stroke [facility])    Phreesia 04/10/2020    Past Surgical History:  Procedure Laterality Date  . BUBBLE STUDY  03/21/2020   Procedure: BUBBLE STUDY;  Surgeon: [name], MD;  Location: [place];  Service: Cardiology;;  . TEE WITHOUT CARDIOVERSION N/A 03/21/2020   Procedure: TRANSESOPHAGEAL ECHOCARDIOGRAM (TEE);  Surgeon: [name], MD;  Location: [place];  Service: Cardiology;  Laterality: N/A;    There were no vitals filed for this visit.   Subjective Assessment - 04/12/20 1643    Subjective pt good historian, pleasant, engaged    Currently in Pain? No/denies              SLP Evaluation [facility] - 04/12/20 1644      SLP Visit Information   SLP Received On 04/12/20    Referring Provider (SLP) Reesa Chew, PA    Onset Date 03/16/2020    Medical Diagnosis Left MCA      Pain Assessment   Currently in Pain? No/denies      General Information   HPI Cody Henson is  a 45 year old right-handed male with history of depression maintained on Adderall as well as Zoloft. History from chart review due to aphasia. Patient lives with spouse and 64-year-old child. Wife is expecting in June. Two-level home 2 steps to entry.  Independent prior to admission.  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  He presented on 03/16/2020 with right hemiparesis, facial droop and aphasia. Cranial CT scan showed subtle evolving hypodensity involving the supra ganglionic mid posterior left frontal lobe concerning for acute left MCA distribution infarct.  Patient did receive TPA.  MRI showed patchy multifocal acute ischemic infarct involving the left cerebral hemisphere.  Associated mild scattered petechial hemorrhage without frank hemorrhagic transformation. Pt received inpatient rehab services.    Behavioral/Cognition possible flat affect, decreased eye contact    Mobility Status ambulatory      Prior Functional Status   Cognitive/Linguistic Baseline Within functional limits    Type of Home House     Lives With Spouse    Available Support Family    Education PhD, molecular biologist    Vocation Full time employment      Pain Assessment   Pain Assessment No/denies pain      Cognition  Overall Cognitive Status Difficult to assess   d/t aphasia and apraxia of speech   Difficult to assess due to Impaired communication    Attention Selective    Selective Attention Appears intact    Memory Impaired    Awareness Impaired    Awareness Impairment Emergent impairment;Anticipatory impairment    Problem Solving Appears intact      Auditory Comprehension   Overall Auditory Comprehension Appears within functional limits for tasks assessed      Reading Comprehension   Reading Status Impaired    Word level 76-100% accurate    Sentence Level 76-100% accurate    Paragraph Level 51-75% accurate    Functional Environmental (signs, name badge) Within functional limits       Expression   Primary Mode of Expression Verbal      Verbal Expression   Overall Verbal Expression Impaired    Initiation Impaired    Automatic Speech Name;Social Response    Level of Generative/Spontaneous Verbalization Sentence    Repetition No impairment    Naming Impairment    Responsive 76-100% accurate    Confrontation 75-100% accurate    Convergent 75-100% accurate    Divergent 75-100% accurate    Other Naming Comments see clinical impression statement    Verbal Errors Semantic paraphasias;Phonemic paraphasias;Aware of errors;Inconsistent    Pragmatics Impairment    Impairments Abnormal affect;Dysprosody;Eye contact;Monotone   unable to determine is this is d/t CVA or baseline, needs further assessment   Effective Techniques Phonemic cues;Articulatory cues    Non-Verbal Means of Communication Not applicable      Written Expression   Dominant Hand Right    Written Expression Unable to assess (comment)   d/t RUE deficits     Oral Motor/Sensory Function   Overall Oral Motor/Sensory Function Appears within functional limits for tasks assessed      Motor Speech   Overall Motor Speech Impaired    Respiration Within functional limits    Phonation Normal    Resonance Within functional limits    Articulation Within functional limitis    Intelligibility Intelligible    Motor Planning Impaired    Level of Impairment Sentence    Motor Speech Errors Groping for words;Inconsistent;Aware    Effective Techniques Slow rate;Pacing    Phonation [facility]              SLP Education - 04/12/20 1657    Education Details ST POC    Person(s) Educated Patient    Methods Explanation;Demonstration;Verbal cues    Comprehension Verbalized understanding;Returned demonstration            SLP Short Term Goals - 04/12/20 1710      SLP SHORT TERM GOAL #1   Title Pt will utilize speech intelligibility strategies to produce speech intelligibility to >95% at the complex sentence level.     Baseline Minimal assistance for basic sentences    Time 10    Period --   sessions   Status New      SLP SHORT TERM GOAL #2   Title With moderate cues, pt will utilize word finding strategies during a description task to effective produce 3 descriptive sentences with >75%.    Baseline expressive langauge contains lots of fillers    Time 10    Period --   sessions   Status New      SLP SHORT TERM GOAL #3   Title With moderate cues, pt will increase reading comprehension by answering [facility] questions after reading semi-complex  information with 50% accuracy.    Baseline maximal for work related scientific papers    Time 10    Period --   sessions   Status New      SLP SHORT TERM GOAL #4   Title With minimal cues, pt will correctly spell functional phrases with > 90% accuracy.    Baseline unable to spell greetings for email responses    Time 10    Period --   sessions   Status New            SLP Long Term Goals - 04/12/20 1719      SLP LONG TERM GOAL #1   Title Pt will utilize word finding strategies to communicate/convey abstract complex information independently.    Baseline Moderate Assistance    Time 12    Period Weeks    Status New    Target Date 07/05/20      SLP LONG TERM GOAL #2   Title Pt will use speech intelligibility strategies to achieve >95% intelligibility when conveying complex abstract information.    Baseline moderate assistance    Time 12    Period Weeks    Status New    Target Date 07/05/20      SLP LONG TERM GOAL #3   Title Pt will read complex material with > 75% comprehension.    Baseline short simple paragraph level    Time 12    Period Weeks    Status New    Target Date 07/05/20      SLP LONG TERM GOAL #4   Title Pt will compose a semi-complex email with < 25% spelling errors.    Baseline unable to spell basic greetings    Time 12    Period Weeks    Status New    Target Date 07/05/20            Plan - 04/12/20 1702    Clinical  Impression Statement Pt presents present with intermittent word finding deficits when conveying complex/abstract information. In addition, he also has intermittent imprecise articulation d/t apraxia of speech. As such, his speech is c/b occasionally decreased prosody, halting speech, contains fillers ("um") and oral gropping. Communication appears very effortful. Pt also has difficulty with writing, reading and reading comprehension. For example, he has difficulty replying to emails and texts as he reports inaiblity to recall spelling of basic words such as "hello." Pt desires to return to work as a Arts administrator. Per pt he needs "to be able to speak clearly, concisely as well as read and comprehnd scientific papers." Skilled ST intervention is required to improve pt's abilities for a successful return to work as well as increase his functional independence when communicating complex information.    Speech Therapy Frequency 2x / week    Duration 12 weeks    Treatment/Interventions Language facilitation;Compensatory techniques;SLP instruction and feedback;Patient/family education    SLP Home Exercise Plan provided, see instruction section    Consulted and Agree with Plan of Care Patient           Patient will benefit from skilled therapeutic intervention in order to improve the following deficits and impairments:   Aphasia  Apraxia  Cognitive communication deficit  Left middle cerebral artery stroke [facility])    Problem List Patient Active Problem List   Diagnosis Date Noted  . Transaminitis   . Hemiparesis affecting dominant side as late effect of stroke ([place])   . Blood pressure increase diastolic   .  Left middle cerebral artery stroke ([place]) 03/23/2020  . Urinary retention   . Dyslipidemia   . Benign essential HTN   . Acute ischemic stroke ([place])   . Global aphasia   . Depression   . Hyperglycemia   . Right hemiparesis ([place])   . Aphasia   . Elevated blood pressure reading   .  Stroke (cerebrum) ([place]) 03/17/2020  . Enthesopathy of hip region on both sides 01/26/2019  . Chronic right shoulder pain 12/10/2014  . Scapular dyskinesis 07/29/2014  . Bursitis of right shoulder 03/01/2014   Sarafina Puthoff B. Rutherford Nail M.S., CCC-SLP, [place] Pathologist Rehabilitation Services Office [phone]  Stormy Fabian 04/12/2020, 5:26 PM  [place] MAIN [facility] SERVICES [address] [place], [place], [address] Phone: [phone]   Fax:  [phone]  Name: Virgil Lightner MRN: [id number] Date of Birth: [birth year]

## 2020-04-12 NOTE — Patient Instructions (Signed)
Sing songs with 43 year old daughter, sing along to radio during familiar songs, bring in job related scientific paper

## 2020-04-13 MED ORDER — LISINOPRIL 10 MG PO TABS
10.0000 mg | ORAL_TABLET | Freq: Every day | ORAL | 0 refills | Status: DC
Start: 2020-04-13 — End: 2020-04-14

## 2020-04-14 MED ORDER — LISINOPRIL 10 MG PO TABS: 10.0000 mg | ORAL_TABLET | Freq: Every day | ORAL | 0 refills | Status: DC

## 2020-04-14 NOTE — Progress Notes (Signed)
Medication resent

## 2020-04-14 NOTE — Addendum Note (Signed)
Addended by: Eddie Dibbles on: 04/14/2020 09:58 AM   Modules accepted: Orders

## 2020-04-15 ENCOUNTER — Ambulatory Visit: Payer: BC Managed Care – PPO

## 2020-04-15 ENCOUNTER — Ambulatory Visit: Payer: BC Managed Care – PPO | Admitting: Speech Pathology

## 2020-04-15 ENCOUNTER — Ambulatory Visit: Payer: BC Managed Care – PPO | Admitting: Occupational Therapy

## 2020-04-15 ENCOUNTER — Other Ambulatory Visit: Payer: Self-pay

## 2020-04-15 DIAGNOSIS — R2681 Unsteadiness on feet: Secondary | ICD-10-CM | POA: Diagnosis not present

## 2020-04-15 DIAGNOSIS — R4701 Aphasia: Secondary | ICD-10-CM | POA: Diagnosis not present

## 2020-04-15 DIAGNOSIS — M6281 Muscle weakness (generalized): Secondary | ICD-10-CM

## 2020-04-15 DIAGNOSIS — I63512 Cerebral infarction due to unspecified occlusion or stenosis of left middle cerebral artery: Secondary | ICD-10-CM

## 2020-04-15 DIAGNOSIS — R482 Apraxia: Secondary | ICD-10-CM | POA: Diagnosis not present

## 2020-04-15 DIAGNOSIS — I69359 Hemiplegia and hemiparesis following cerebral infarction affecting unspecified side: Secondary | ICD-10-CM

## 2020-04-15 DIAGNOSIS — R41841 Cognitive communication deficit: Secondary | ICD-10-CM | POA: Diagnosis not present

## 2020-04-15 DIAGNOSIS — R278 Other lack of coordination: Secondary | ICD-10-CM

## 2020-04-15 DIAGNOSIS — R269 Unspecified abnormalities of gait and mobility: Secondary | ICD-10-CM | POA: Diagnosis not present

## 2020-04-15 NOTE — Therapy (Signed)
[place] MAIN [facility] SERVICES [address] [place], [place], [address] Phone: [phone]   Fax:  [phone]  Physical Therapy Treatment  Patient Details  Name: Cody Henson MRN: [id number] Date of Birth: 1976-02-16 No data recorded  Encounter Date: 04/15/2020   PT End of Session - 04/15/20 0917    Visit Number 2    Number of Visits 25    Date for PT Re-Evaluation 07/05/20    PT Start Time 0913    PT Stop Time 1000    PT Time Calculation (min) 47 min    Equipment Utilized During Treatment Gait belt    Activity Tolerance Patient tolerated treatment well    Behavior During Therapy [facility] for tasks assessed/performed           Past Medical History:  Diagnosis Date  . Allergy    Phreesia 04/10/2020  . Depressed   . Hyperlipidemia    Phreesia 04/10/2020  . Hypertension    Phreesia 04/10/2020  . Neuromuscular disorder ([place])    Phreesia 04/10/2020  . Stroke [facility])    Phreesia 04/10/2020    Past Surgical History:  Procedure Laterality Date  . BUBBLE STUDY  03/21/2020   Procedure: BUBBLE STUDY;  Surgeon: [name], MD;  Location: [place];  Service: Cardiology;;  . TEE WITHOUT CARDIOVERSION N/A 03/21/2020   Procedure: TRANSESOPHAGEAL ECHOCARDIOGRAM (TEE);  Surgeon: [name], MD;  Location: [place];  Service: Cardiology;  Laterality: N/A;    There were no vitals filed for this visit.   Subjective Assessment - 04/15/20 0916    Subjective The patient reports that he is doing well, has no complaints.  The patient denies falling or stumbling.    Pertinent History HTN    Limitations Walking;Standing    How long can you sit comfortably? unrestricted    How long can you stand comfortably? 15 mins    How long can you walk comfortably? 1 hour no AD    Patient Stated Goals be able to drive at type    Currently in Pain? No/denies    Pain Score 0-No pain           Treatment  Nu Step L2 6 mins SPM >70   Airex  WBOS EO 30"x1/ EC 30"x2 no UE support Airex NBOS EO 30"x1/ EC 30"x2 left lean, decreased wb on right no UE support Bosu forward lunges 5"x10 each leg  Sit to stand 2x10 without UE support   In Parallel bars Airex beam Tandem walk forward x 5laps no UE support Airex beam Side stepping x5 laps no UE support [place] 3# x 5 laps  Hurdle step overs with 2# ankle weight forward/backward x 8 laps no UE support Hurdle side stepping with 2# ankle weight x8 laps no UE support   [place] Balance Tux Racer- bronze set no UE support decreased weight shift to right                         PT Short Term Goals - 04/12/20 1310      PT SHORT TERM GOAL #1   Title Patient will be independent in home exercise program to improve strength/mobility for better functional independence with ADLs.    Status New    Target Date 07/05/20             PT Long Term Goals - 04/12/20 1311      PT LONG TERM GOAL #1  Title Patient will increase FOTO score to equal to or greater than 72% to demonstrate statistically significant improvement in mobility and quality of life.    Baseline 04/12/20 56%    Status New    Target Date 07/05/20      PT LONG TERM GOAL #2   Title Patient will increase Functional Gait Assessment score to >26/30 as to reduce fall risk and improve dynamic gait safety with community ambulation.    Baseline 04/12/20 FGA 22/30    Status New    Target Date 07/05/20      PT LONG TERM GOAL #3   Title Patient will be able to navigate full flight of stairs independently with reciprocal pattern  to get to next level of home.    Baseline 04/12/20  step to pattern bilateral UE support    Status New    Target Date 07/05/20      PT LONG TERM GOAL #4   Status --    Target Date --                 Plan - 04/15/20 0944    Clinical Impression Statement The patient's treatment program was initiated today with strengthening and balance activities.  The patient demonstrates  evidence of right sided LE weakness with decreased WB on right with balance activities.  Patient continues to benefit from additional skilled PT services to improve LE strength and balance for improved quality of life.    Personal Factors and Comorbidities Comorbidity 2    Comorbidities HTN, depression    Examination-Activity Limitations Locomotion Level;Stairs    Examination-Participation Restrictions Yard Work;Driving;Community Activity    Stability/Clinical Decision Making Evolving/Moderate complexity    Rehab Potential Good    PT Frequency 2x / week    PT Duration 12 weeks    PT Treatment/Interventions ADLs/Self Care Home Management;Gait training;Stair training;Functional mobility training;Therapeutic activities;Therapeutic exercise;Balance training;Neuromuscular re-education;Patient/family education;Passive range of motion    PT Next Visit Plan initiate strengthening and balance activites    PT Home Exercise Plan reviewed sit to stand for home    Consulted and Agree with Plan of Care Patient           Patient will benefit from skilled therapeutic intervention in order to improve the following deficits and impairments:  Abnormal gait,Decreased balance,Decreased endurance,Decreased mobility,Difficulty walking,Decreased range of motion,Improper body mechanics,Decreased activity tolerance,Decreased strength,Impaired flexibility,Postural dysfunction  Visit Diagnosis: Left middle cerebral artery stroke ([facility])  Hemiparesis affecting dominant side as late effect of stroke ([facility])  Unsteadiness on feet  Abnormality of gait and mobility  Muscle weakness (generalized)     Problem List Patient Active Problem List   Diagnosis Date Noted  . Transaminitis   . Hemiparesis affecting dominant side as late effect of stroke ([place])   . Blood pressure increase diastolic   . Left middle cerebral artery stroke ([place]) 03/23/2020  . Urinary retention   . Dyslipidemia   . Benign essential HTN   .  Acute ischemic stroke ([place])   . Global aphasia   . Depression   . Hyperglycemia   . Right hemiparesis ([place])   . Aphasia   . Elevated blood pressure reading   . Stroke (cerebrum) ([place]) 03/17/2020  . Enthesopathy of hip region on both sides 01/26/2019  . Chronic right shoulder pain 12/10/2014  . Scapular dyskinesis 07/29/2014  . Bursitis of right shoulder 03/01/2014    Hal Morales PT, DPT 04/15/2020, 10:06 AM  [place] MAIN [facility] SERVICES  [place], [place], [address] Phone: [phone]   Fax:  [phone]  Name: Cody Henson MRN: [id number] Date of Birth: 09-Jul-1975

## 2020-04-15 NOTE — Patient Instructions (Signed)
Use spell check for formal emails, continue sending emails to therapist for targeting spelling errors

## 2020-04-15 NOTE — Therapy (Signed)
[place] MAIN [facility] SERVICES [address] [place], [place], [address] Phone: [phone]   Fax:  [phone]  Speech Language Pathology Treatment  Patient Details  Name: Cody Henson MRN: [id number] Date of Birth: 1975/02/23 Referring Provider (SLP): Reesa Chew, [place]   Encounter Date: 04/15/2020   End of Session - 04/15/20 1637    Visit Number 2    Number of Visits 25    Date for SLP Re-Evaluation 07/05/20    Authorization Type [employer]    Authorization Time Period 04/12/2020 thru 07/05/2020    Authorization - Visit Number 2    Progress Note Due on Visit 10    SLP Start Time 1000    SLP Stop Time  1100    SLP Time Calculation (min) 60 min    Activity Tolerance Patient tolerated treatment well           Past Medical History:  Diagnosis Date  . Allergy    Phreesia 04/10/2020  . Depressed   . Hyperlipidemia    Phreesia 04/10/2020  . Hypertension    Phreesia 04/10/2020  . Neuromuscular disorder ([place])    Phreesia 04/10/2020  . Stroke [facility])    Phreesia 04/10/2020    Past Surgical History:  Procedure Laterality Date  . BUBBLE STUDY  03/21/2020   Procedure: BUBBLE STUDY;  Surgeon: [name], MD;  Location: [place];  Service: Cardiology;;  . TEE WITHOUT CARDIOVERSION N/A 03/21/2020   Procedure: TRANSESOPHAGEAL ECHOCARDIOGRAM (TEE);  Surgeon: [name], MD;  Location: [place];  Service: Cardiology;  Laterality: N/A;    There were no vitals filed for this visit.   Subjective Assessment - 04/15/20 1635    Subjective pt pleasant, brought in requested items    Currently in Pain? No/denies            Neuro   ST   TX   NOTE          Treatment Data and Patient's Response to Treatment   Skilled treatment session focused on pt's expressive communication abilities. Pt was pleasant and he brought in scientific paper for SLP to review for reading comprehension task as well as book that he is  currently reading. SLP reviewed the email that pt had sent for spelling errors d/t language deficits. Pt was ~ 75% accurate improving to 100% with minimal cues from SLP. SLP further facilitated session by introducing complex language game, TABOO. With minimal to supervision level cues, pt was able to accurately describe target word with appropriate words and appropriate intelligibility. In addition, he was able to state SLP's target word after listening to description. Pt demonstrated good ability during this session, pt his overall speech patterns continue to be halting and signs/symptoms of aphasia and apraxia. Given his level of function prior to this CVA, his current abilities are not commensurate with those, therefore skilled ST intervention is required to target higher level language and speech to communicate complex/abstract information and ideas more independently.         SLP Education - 04/15/20 1636    Education Details ST POC for next session, use of spell check for formal emails    Person(s) Educated Patient    Methods Explanation;Verbal cues;Demonstration    Comprehension Verbalized understanding;Returned demonstration            SLP Short Term Goals - 04/12/20 1710      SLP SHORT TERM GOAL #1   Title Pt will utilize speech intelligibility strategies  to produce speech intelligibility to >95% at the complex sentence level.    Baseline Minimal assistance for basic sentences    Time 10    Period --   sessions   Status New      SLP SHORT TERM GOAL #2   Title With moderate cues, pt will utilize word finding strategies during a description task to effective produce 3 descriptive sentences with >75%.    Baseline expressive langauge contains lots of fillers    Time 10    Period --   sessions   Status New      SLP SHORT TERM GOAL #3   Title With moderate cues, pt will increase reading comprehension by answering [facility] questions after reading semi-complex information with 50%  accuracy.    Baseline maximal for work related scientific papers    Time 10    Period --   sessions   Status New      SLP SHORT TERM GOAL #4   Title With minimal cues, pt will correctly spell functional phrases with > 90% accuracy.    Baseline unable to spell greetings for email responses    Time 10    Period --   sessions   Status New            SLP Long Term Goals - 04/12/20 1719      SLP LONG TERM GOAL #1   Title Pt will utilize word finding strategies to communicate/convey abstract complex information independently.    Baseline Moderate Assistance    Time 12    Period Weeks    Status New    Target Date 07/05/20      SLP LONG TERM GOAL #2   Title Pt will use speech intelligibility strategies to achieve >95% intelligibility when conveying complex abstract information.    Baseline moderate assistance    Time 12    Period Weeks    Status New    Target Date 07/05/20      SLP LONG TERM GOAL #3   Title Pt will read complex material with > 75% comprehension.    Baseline short simple paragraph level    Time 12    Period Weeks    Status New    Target Date 07/05/20      SLP LONG TERM GOAL #4   Title Pt will compose a semi-complex email with < 25% spelling errors.    Baseline unable to spell basic greetings    Time 12    Period Weeks    Status New    Target Date 07/05/20            Plan - 04/15/20 1637    Speech Therapy Frequency 2x / week    Duration 12 weeks    Treatment/Interventions Language facilitation;Compensatory techniques;SLP instruction and feedback;Patient/family education    Potential to Achieve Goals Good    SLP Home Exercise Plan provided, see instruction section    Consulted and Agree with Plan of Care Patient           Patient will benefit from skilled therapeutic intervention in order to improve the following deficits and impairments:   Aphasia  Apraxia  Left middle cerebral artery stroke [facility])    Problem List Patient Active  Problem List   Diagnosis Date Noted  . Transaminitis   . Hemiparesis affecting dominant side as late effect of stroke ([place])   . Blood pressure increase diastolic   . Left middle cerebral artery stroke ([place]) 03/23/2020  .  Urinary retention   . Dyslipidemia   . Benign essential HTN   . Acute ischemic stroke ([place])   . Global aphasia   . Depression   . Hyperglycemia   . Right hemiparesis ([place])   . Aphasia   . Elevated blood pressure reading   . Stroke (cerebrum) ([place]) 03/17/2020  . Enthesopathy of hip region on both sides 01/26/2019  . Chronic right shoulder pain 12/10/2014  . Scapular dyskinesis 07/29/2014  . Bursitis of right shoulder 03/01/2014   Laelah Siravo B. Rutherford Nail M.S., CCC-SLP, [place] Pathologist Rehabilitation Services Office [phone]  Stormy Fabian 04/15/2020, 4:38 PM  [place] MAIN [facility] SERVICES [address] [place], [place], [address] Phone: [phone]   Fax:  [phone]   Name: Askia Hazelip MRN: [id number] Date of Birth: 04-29-1975

## 2020-04-16 ENCOUNTER — Encounter: Payer: Self-pay | Admitting: Occupational Therapy

## 2020-04-16 NOTE — Therapy (Signed)
[place] MAIN [facility] SERVICES [address] [place], [place], [address] Phone: [phone]   Fax:  [phone]  Occupational Therapy Evaluation  Patient Details  Name: Cody Henson MRN: [id number] Date of Birth: 30-Oct-1975 No data recorded  Encounter Date: 04/12/2020   OT End of Session - 04/16/20 2034    Visit Number 1    Number of Visits 24    Date for OT Re-Evaluation 07/05/20    OT Start Time 1500    OT Stop Time 1558    OT Time Calculation (min) 58 min    Activity Tolerance Patient tolerated treatment well    Behavior During Therapy [facility] for tasks assessed/performed           Past Medical History:  Diagnosis Date  . Allergy    Phreesia 04/10/2020  . Depressed   . Hyperlipidemia    Phreesia 04/10/2020  . Hypertension    Phreesia 04/10/2020  . Neuromuscular disorder ([place])    Phreesia 04/10/2020  . Stroke [facility])    Phreesia 04/10/2020    Past Surgical History:  Procedure Laterality Date  . BUBBLE STUDY  03/21/2020   Procedure: BUBBLE STUDY;  Surgeon: [name], MD;  Location: [place];  Service: Cardiology;;  . TEE WITHOUT CARDIOVERSION N/A 03/21/2020   Procedure: TRANSESOPHAGEAL ECHOCARDIOGRAM (TEE);  Surgeon: [name], MD;  Location: [place];  Service: Cardiology;  Laterality: N/A;    There were no vitals filed for this visit.   Subjective Assessment - 04/16/20 2025    Subjective  January 26th had a stroke, was hospitalized for several weeks, had inpatient rehab.  Couldn't walk or speak and has improved.  Right side is numb, has some sensation in right UE, able to detect cold and hot.  Decreased sensation otherwise.    Pertinent History Per medial records, Cody Henson is a 45 y.o. male with history of depression who was admitted on 03/16/20 with right sided weakness, facial weakness and aphasia. CT head showed evolving hypodensity in mid/posterior left frontal lobe and CTA was negative for LVO. UDS  showed THC. He received tPA and follow up MRI showed patchy multifocal acute ischemic infarcts in left cerebral hemisphere without hemorrhagic transformation.    Patient Stated Goals Pt would like to be as independent as possible, wants to drive and return to work.    Currently in Pain? No/denies    Pain Score 0-No pain             [facility] OT Assessment - 04/16/20 2026      Assessment   Medical Diagnosis CVA    Onset Date/Surgical Date 03/16/20    Hand Dominance Right    Next MD Visit 05/05/2020    Prior Therapy in the hospital      Balance Screen   Has the patient fallen in the past 6 months Yes    How many times? once when the stroke happened.      Home  Environment   Family/patient expects to be discharged to: Private residence    Living Arrangements Spouse/significant other    Available Help at Discharge Family    Type of [place] Two level    Alternate Level Stairs - Number of Steps 16 steps    Bathroom Building control surveyor;Door    [employer] Handicapped height    Lives With Spouse   wife Cody Henson     Prior Function  Level of Independence Independent    Vocation Full time employment    Vocation Requirements typing, pt is a Location manager, Forensic psychologist.  Uses a pipet a lot    Leisure fishing, Proofreader, remodeling      ADL   Eating/Feeding Needs assist with cutting food   slow and frustrating   Grooming --   using 2 hands to brush teeth, decreased sensation with shampooing hair   Upper Body Bathing Modified independent    Lower Body Bathing Increased time    Upper Body Dressing Needs assist for fasteners;Increased time    Lower Body Dressing Increased time   right sock and shoe, difficulty tying, some difficulty with fasteners   Toilet Transfer Modified independent    Toileting - Clothing Manipulation Increased time    Toileting -  Hygiene Increase time    Tub/Shower Transfer Modified independent    ADL  comments Patient works as a Biomedical engineer, works with a lot of small vials, pipets, tasks involving fine motor coordination.  Pt's wife is pregnant with baby due mid June.  He will need to get his daughter in and out of the car and car seat, lift first child who is about 30# from the highchair.  Patient will need to improve typing skills for work, typing test this date 3 WPM with 50% errors with adjusted word per min score of 4.  He likes to fish and would like to be able to tie his lures.  He is unable to perform any cooking or yardwork currently.  Handwriting impaired with poor grasping pattern of pen and poor legiblity.      IADL   Prior Level of Function Shopping independence    Shopping Needs to be accompanied on any shopping trip    Prior Level of Function Light Housekeeping independent    Light Housekeeping Needs help with all home maintenance tasks    Prior Level of Function Meal Prep independent    Meal Prep Able to complete simple cold meal and snack prep    Prior Level of Function Community Mobility independent    Community Mobility Relies on family or friends for transportation    Prior Level of Function Medication Managment independent    Medication Management Is responsible for taking medication in correct dosages at correct time    Prior Level of Function Financial Management independent    Financial Management --   hasn't done anything since being home yet     Mobility   Mobility Status Needs assist    Mobility Status Comments no assistive device      Written Expression   Handwriting 25% legible    Written Experience --   decreased pen grip     Vision - History   Additional Comments Reports some issues the first day but now denies any issues.      Vision Assessment   Comment Denies any changes in vision      Cognition   Overall Cognitive Status Impaired/Different from baseline    Memory Impaired      Observation/Other Assessments   Focus on Therapeutic Outcomes  (FOTO)  51      Sensation   Light Touch Impaired by gross assessment    Stereognosis Impaired Detail    Stereognosis Impaired Details Impaired RUE    Hot/Cold Appears Intact    Proprioception Impaired Detail    Proprioception Impaired Details Impaired RUE      Coordination   Gross Motor Movements are Fluid  and Coordinated No    Fine Motor Movements are Fluid and Coordinated No    Finger Nose Finger Test impaired    9 Hole Peg Test Right;Left    Left 9 Hole Peg Test 21 sec.    Coordination Able to make a full fist, able to oppose thumb to each digit but has to be focused and intentional with movements.  Some difficulty remaining with modulating pressure with grasping patterns.      ROM / Strength   AROM / PROM / Strength AROM;Strength      AROM   Overall AROM  Within functional limits for tasks performed    Overall AROM Comments RUE      Strength   Overall Strength Deficits    Overall Strength Comments Right UE 4-/5, left 5/5      Hand Function   Right Hand Grip (lbs) 46    Right Hand Lateral Pinch 15 lbs    Right Hand 3 Point Pinch 3 lbs   fingers slipping   Left Hand Grip (lbs) 130    Left Hand Lateral Pinch 17 lbs    Left 3 point pinch 18 lbs    Comment 2 point R 9, left 14#                           OT Education - 04/16/20 2034    Education Details role of OT, goals, plan of care    Person(s) Educated Patient    Methods Explanation    Comprehension Verbalized understanding               OT Long Term Goals - 04/16/20 2040      OT LONG TERM GOAL #1   Title Pt will be independent with home exercise program    Baseline no program at eval    Time 12    Period Weeks    Status New    Target Date 07/05/20      OT LONG TERM GOAL #2   Title Pt will complete dressing skills, UE and LE with modified independence    Baseline difficulty with socks and shoes at times, unable to tie laces    Time 6    Period Weeks    Status New    Target Date  05/24/20      OT LONG TERM GOAL #3   Title Pt will improve right grip strength by 15# to assist with cutting meat with modified independence.    Baseline Eval unable    Time 12    Period Weeks    Status New    Target Date 07/06/18      OT LONG TERM GOAL #4   Title Pt will improve right hand coordination to brush teeth without difficulty.    Baseline Eval:has to use left hand currently    Time 6    Period Weeks    Status New    Target Date 05/24/20      OT LONG TERM GOAL #5   Title Patient will improve typing speed greater than 12 WPM to answer emails and perform work tasks.    Baseline Eval:  adjust WPM 4.    Time 12    Period Weeks    Status New    Target Date 07/05/20      Long Term Additional Goals   Additional Long Term Goals Yes      OT LONG TERM GOAL #6  Title Patient will improve strength in right hand to pick up 66 yo daughter from high chair.    Baseline unable at eval    Time 12    Period Weeks    Status New    Target Date 07/05/20      OT LONG TERM GOAL #7   Title Patient will improve hand function to write and sign his name on important papers with legibilty greater than 50%    Baseline Eval: 25%, poor legibility    Time 12    Period Weeks    Status New    Target Date 07/05/20      OT LONG TERM GOAL #8   Title Pt will improve FOTO score to 65 or greater to show clinically relevant change to engage in ADL and IADL tasks with greater independence.    Baseline Eval 51    Time 12    Period Weeks    Status New    Target Date 07/05/20                 Plan - 04/16/20 2035    Clinical Impression Statement Pt is a 45 yo male who suffered a CVA, 03/16/2020. He lives with his wife, has a 10 yo child and has another baby due in June 2022.  He presents with muscle weakness, lack of coordination, decreased grip strength on right, impaired ability to modulate graded pressure when grasping and manipulating items, impaired sensation, decreased ability to  perform self care and IADL tasks including work related activities and performing child care.  Pt would benefit from skilled OT service to maximize safety and independence in necessary daily tasks.    OT Occupational Profile and History Detailed Assessment- Review of Records and additional review of physical, cognitive, psychosocial history related to current functional performance    Occupational performance deficits (Please refer to evaluation for details): ADL's;IADL's;Work;Leisure;Social Participation    Body Structure / Function / Physical Skills ADL;Dexterity;Strength;Balance;Coordination;FMC;IADL;Endurance;Sensation;UE functional use;Proprioception    Psychosocial Skills Environmental  Adaptations;Habits;Routines and Behaviors    Rehab Potential Good    Clinical Decision Making Several treatment options, min-mod task modification necessary    Comorbidities Affecting Occupational Performance: May have comorbidities impacting occupational performance    Modification or Assistance to Complete Evaluation  No modification of tasks or assist necessary to complete eval    OT Frequency 2x / week    OT Duration 12 weeks    OT Treatment/Interventions Self-care/ADL training;Cryotherapy;Therapeutic exercise;DME and/or AE instruction;Balance training;Neuromuscular education;Manual Therapy;Moist Heat;Contrast Bath;Therapeutic activities;Patient/family education    Consulted and Agree with Plan of Care Patient           Patient will benefit from skilled therapeutic intervention in order to improve the following deficits and impairments:   Body Structure / Function / Physical Skills: ADL,Dexterity,Strength,Balance,Coordination,FMC,IADL,Endurance,Sensation,UE functional use,Proprioception   Psychosocial Skills: Environmental  Adaptations,Habits,Routines and Behaviors   Visit Diagnosis: Hemiparesis affecting dominant side as late effect of stroke ([facility])  Muscle weakness (generalized)  Other lack of  coordination    Problem List Patient Active Problem List   Diagnosis Date Noted  . Transaminitis   . Hemiparesis affecting dominant side as late effect of stroke ([place])   . Blood pressure increase diastolic   . Left middle cerebral artery stroke ([place]) 03/23/2020  . Urinary retention   . Dyslipidemia   . Benign essential HTN   . Acute ischemic stroke ([place])   . Global aphasia   . Depression   . Hyperglycemia   .  Right hemiparesis ([place])   . Aphasia   . Elevated blood pressure reading   . Stroke (cerebrum) ([place]) 03/17/2020  . Enthesopathy of hip region on both sides 01/26/2019  . Chronic right shoulder pain 12/10/2014  . Scapular dyskinesis 07/29/2014  . Bursitis of right shoulder 03/01/2014   Amy T Tomasita Morrow, OTR/L, CLT  Lovett,Amy 04/16/2020, 8:54 PM  [place] MAIN [facility] SERVICES [address] [place], [place], [address] Phone: [phone]   Fax:  [phone]  Name: Saamir Armstrong MRN: [id number] Date of Birth: Feb 28, 1975

## 2020-04-16 NOTE — Therapy (Signed)
[place] MAIN [facility] SERVICES [address] [place], [place], [address] Phone: [phone]   Fax:  [phone]  Occupational Therapy Treatment  Patient Details  Name: Cody Henson MRN: [id number] Date of Birth: Dec 10, 1975 No data recorded  Encounter Date: 04/15/2020   OT End of Session - 04/16/20 2059    Visit Number 2    Number of Visits 24    Date for OT Re-Evaluation 07/05/20    OT Start Time 1100    OT Stop Time 1148    OT Time Calculation (min) 48 min    Activity Tolerance Patient tolerated treatment well    Behavior During Therapy [facility] for tasks assessed/performed           Past Medical History:  Diagnosis Date  . Allergy    Phreesia 04/10/2020  . Depressed   . Hyperlipidemia    Phreesia 04/10/2020  . Hypertension    Phreesia 04/10/2020  . Neuromuscular disorder ([place])    Phreesia 04/10/2020  . Stroke [facility]    Phreesia 04/10/2020    Past Surgical History:  Procedure Laterality Date  . BUBBLE STUDY  03/21/2020   Procedure: BUBBLE STUDY;  Surgeon: [name], MD;  Location: [place];  Service: Cardiology;;  . TEE WITHOUT CARDIOVERSION N/A 03/21/2020   Procedure: TRANSESOPHAGEAL ECHOCARDIOGRAM (TEE);  Surgeon: [name], MD;  Location: [place];  Service: Cardiology;  Laterality: N/A;    There were no vitals filed for this visit.   Subjective Assessment - 04/16/20 2057    Subjective  Pt reports he brought in some items from work to show what hand function he requires for work related tasks.    Pertinent History Per medial records, Cody Henson is a 45 y.o. male with history of depression who was admitted on 03/16/20 with right sided weakness, facial weakness and aphasia. CT head showed evolving hypodensity in mid/posterior left frontal lobe and CTA was negative for LVO. UDS showed THC. He received tPA and follow up MRI showed patchy multifocal acute ischemic infarcts in left cerebral hemisphere without  hemorrhagic transformation.    Patient Stated Goals Pt would like to be as independent as possible, wants to drive and return to work.    Currently in Pain? No/denies    Pain Score 0-No pain           ADL/IADL: Pt brought in work related items this date for therapist review and assessment:  Pipet, sample containers for specimens, holders for specimen containers, small vials Patient attempting to demonstrate holding, managing items and showing therapist how he would need to perform for work tasks.  Pt normally does about one whole plate of samples in about 10 mins.  Normally uses right hand to open vials, right hand for pipet, left hand to often close vials.  Pt unable to perform intricate dissections of small samples and will need to use a microscope.    Pt demonstrates difficulty with using cellphone with right dominant hand, using left hand for most tasks.  Difficulty with putting fingertip flat to screen for swiping.    Therapeutic Exercise:   3# dowel for ROM/strengthening for shoulder flexion, ABD, ADD, chest press, forwards and backwards circles.  Therapist demonstration and cues for proper form and technique.  Rest breaks as needed.    Neuromuscular Reeducation:   Patient seen for manipulation of pieces to complete PVC pipe tree designs.  Difficulty at times with picking up items with right hand as well as placing  pieces together to form design.    Response to tx: Pt demonstrates difficulty with graded pressure of right hand, decreased proprioception and sensation on the right.  Decreased motor control with grasping and manipulation of objects.  Difficulty with cell phone use.  Pt's work related tasks rely heavily on hand function and fine motor coordination skills and will need additional focus in this area to improve skills to be proficient in work related tasks.  Continue to work towards goals to increase right UE functional use for ADL and IADL tasks.                     OT Education - 04/16/20 2058    Education Details fine motor coordination, proprioception, safety    Person(s) Educated Patient    Methods Explanation;Demonstration    Comprehension Verbalized understanding;Returned demonstration               OT Long Term Goals - 04/16/20 2040      OT LONG TERM GOAL #1   Title Pt will be independent with home exercise program    Baseline no program at eval    Time 12    Period Weeks    Status New    Target Date 07/05/20      OT LONG TERM GOAL #2   Title Pt will complete dressing skills, UE and LE with modified independence    Baseline difficulty with socks and shoes at times, unable to tie laces    Time 6    Period Weeks    Status New    Target Date 05/24/20      OT LONG TERM GOAL #3   Title Pt will improve right grip strength by 15# to assist with cutting meat with modified independence.    Baseline Eval unable    Time 12    Period Weeks    Status New    Target Date 07/06/18      OT LONG TERM GOAL #4   Title Pt will improve right hand coordination to brush teeth without difficulty.    Baseline Eval:has to use left hand currently    Time 6    Period Weeks    Status New    Target Date 05/24/20      OT LONG TERM GOAL #5   Title Patient will improve typing speed greater than 12 WPM to answer emails and perform work tasks.    Baseline Eval:  adjust WPM 4.    Time 12    Period Weeks    Status New    Target Date 07/05/20      Long Term Additional Goals   Additional Long Term Goals Yes      OT LONG TERM GOAL #6   Title Patient will improve strength in right hand to pick up 31 yo daughter from high chair.    Baseline unable at eval    Time 12    Period Weeks    Status New    Target Date 07/05/20      OT LONG TERM GOAL #7   Title Patient will improve hand function to write and sign his name on important papers with legibilty greater than 50%    Baseline Eval: 25%, poor legibility    Time  12    Period Weeks    Status New    Target Date 07/05/20      OT LONG TERM GOAL #8   Title Pt will improve FOTO score  to 65 or greater to show clinically relevant change to engage in ADL and IADL tasks with greater independence.    Baseline Eval 51    Time 12    Period Weeks    Status New    Target Date 07/05/20                 Plan - 04/16/20 2059    Clinical Impression Statement Pt demonstrates difficulty with graded pressure of right hand, decreased proprioception and sensation on the right.  Decreased motor control with grasping and manipulation of objects.  Difficulty with cell phone use.  Pt's work related tasks rely heavily on hand function and fine motor coordination skills and will need additional focus in this area to improve skills to be proficient in work related tasks.  Continue to work towards goals to increase right UE functional use for ADL and IADL tasks.    OT Occupational Profile and History Detailed Assessment- Review of Records and additional review of physical, cognitive, psychosocial history related to current functional performance    Occupational performance deficits (Please refer to evaluation for details): ADL's;IADL's;Work;Leisure;Social Participation    Body Structure / Function / Physical Skills ADL;Dexterity;Strength;Balance;Coordination;FMC;IADL;Endurance;Sensation;UE functional use;Proprioception    Psychosocial Skills Environmental  Adaptations;Habits;Routines and Behaviors    Rehab Potential Good    Clinical Decision Making Several treatment options, min-mod task modification necessary    Comorbidities Affecting Occupational Performance: May have comorbidities impacting occupational performance    Modification or Assistance to Complete Evaluation  No modification of tasks or assist necessary to complete eval    OT Frequency 2x / week    OT Duration 12 weeks    OT Treatment/Interventions Self-care/ADL training;Cryotherapy;Therapeutic exercise;DME  and/or AE instruction;Balance training;Neuromuscular education;Manual Therapy;Moist Heat;Contrast Bath;Therapeutic activities;Patient/family education    Consulted and Agree with Plan of Care Patient           Patient will benefit from skilled therapeutic intervention in order to improve the following deficits and impairments:   Body Structure / Function / Physical Skills: ADL,Dexterity,Strength,Balance,Coordination,FMC,IADL,Endurance,Sensation,UE functional use,Proprioception   Psychosocial Skills: Environmental  Adaptations,Habits,Routines and Behaviors   Visit Diagnosis: Muscle weakness (generalized)  Other lack of coordination  Hemiparesis affecting dominant side as late effect of stroke [facility])    Problem List Patient Active Problem List   Diagnosis Date Noted  . Transaminitis   . Hemiparesis affecting dominant side as late effect of stroke ([place])   . Blood pressure increase diastolic   . Left middle cerebral artery stroke ([place]) 03/23/2020  . Urinary retention   . Dyslipidemia   . Benign essential HTN   . Acute ischemic stroke ([place])   . Global aphasia   . Depression   . Hyperglycemia   . Right hemiparesis ([place])   . Aphasia   . Elevated blood pressure reading   . Stroke (cerebrum) ([place]) 03/17/2020  . Enthesopathy of hip region on both sides 01/26/2019  . Chronic right shoulder pain 12/10/2014  . Scapular dyskinesis 07/29/2014  . Bursitis of right shoulder 03/01/2014  Amy T Tomasita Morrow, OTR/L, CLT   Lovett,Amy 04/16/2020, 9:12 PM  [place] MAIN [facility] SERVICES [address] [place], [place], [address] Phone: [phone]   Fax:  [phone]  Name: Cody Henson MRN: [id number] Date of Birth: 1975-05-07

## 2020-04-19 ENCOUNTER — Encounter: Payer: Self-pay | Admitting: Registered Nurse

## 2020-04-19 ENCOUNTER — Ambulatory Visit: Payer: BC Managed Care – PPO | Admitting: Occupational Therapy

## 2020-04-19 ENCOUNTER — Ambulatory Visit: Payer: BC Managed Care – PPO | Admitting: Speech Pathology

## 2020-04-19 ENCOUNTER — Ambulatory Visit: Payer: BC Managed Care – PPO | Attending: Physical Medicine and Rehabilitation

## 2020-04-19 ENCOUNTER — Other Ambulatory Visit: Payer: Self-pay

## 2020-04-19 ENCOUNTER — Encounter: Payer: Self-pay | Admitting: Occupational Therapy

## 2020-04-19 ENCOUNTER — Encounter: Payer: BC Managed Care – PPO | Attending: Registered Nurse | Admitting: Registered Nurse

## 2020-04-19 VITALS — BP 123/86 | HR 83 | Temp 98.7°F | Ht 69.0 in | Wt 156.0 lb

## 2020-04-19 DIAGNOSIS — I63512 Cerebral infarction due to unspecified occlusion or stenosis of left middle cerebral artery: Secondary | ICD-10-CM

## 2020-04-19 DIAGNOSIS — R278 Other lack of coordination: Secondary | ICD-10-CM

## 2020-04-19 DIAGNOSIS — R2681 Unsteadiness on feet: Secondary | ICD-10-CM | POA: Diagnosis not present

## 2020-04-19 DIAGNOSIS — R482 Apraxia: Secondary | ICD-10-CM

## 2020-04-19 DIAGNOSIS — R4701 Aphasia: Secondary | ICD-10-CM | POA: Diagnosis not present

## 2020-04-19 DIAGNOSIS — I69359 Hemiplegia and hemiparesis following cerebral infarction affecting unspecified side: Secondary | ICD-10-CM

## 2020-04-19 DIAGNOSIS — I10 Essential (primary) hypertension: Secondary | ICD-10-CM | POA: Insufficient documentation

## 2020-04-19 DIAGNOSIS — R269 Unspecified abnormalities of gait and mobility: Secondary | ICD-10-CM

## 2020-04-19 DIAGNOSIS — M6281 Muscle weakness (generalized): Secondary | ICD-10-CM

## 2020-04-19 DIAGNOSIS — H9191 Unspecified hearing loss, right ear: Secondary | ICD-10-CM | POA: Insufficient documentation

## 2020-04-19 NOTE — Progress Notes (Signed)
Subjective:    Patient ID: Cody Henson, male    DOB: [birth year], 45 y.o.   MRN: [id number]  HPI: Cody Henson is a 45 y.o. male who is her for HFU appointment of his Left Middle Cerebral Artery Stroke, Hemiparesis affecting dominant side as late effect of stroke and hypertension. He presented to [facility] on 03/16/2020 via EMS with complaint of right sided facial droop, right sided weakness and expressive aphasia. Neurology Consulted.  CT Head WO Contrast:  IMPRESSION: 1. Subtle evolving hypodensity involving the supra ganglionic mid and posterior left frontal lobe, concerning for acute left MCA distribution infarct. No intracranial hemorrhage. 2. ASPECTS is 8.  CT Angio: Head and Neck IMPRESSION: 1. Negative CTA for large vessel occlusion. No high-grade or correctable stenosis identified. 2. Mild irregularity of distal left MCA branches, which could be related to recent recanalization. 3. Small layering right pleural effusion.  MR Brain WO Contrast:  IMPRESSION: 1. Patchy multifocal acute ischemic infarcts involving the left cerebral hemisphere as above. Associated mild scattered petechial hemorrhage without frank hemorrhagic transformation. No significant regional mass effect. 2. Otherwise normal brain MRI for age.  ECHO:  1. Evidence of atrial level shunting detected by color flow Doppler. Agitated saline contrast bubble study was positive with shunting observed within 3-6 cardiac cycles suggestive of interatrial shunt. 2. Left ventricular ejection fraction, by estimation, is 65 to 70%. The left ventricle has normal function. The left ventricle has no regional wall motion abnormalities. 3. Right ventricular systolic function is normal. The right ventricular size is normal. 4. No left atrial/left atrial appendage thrombus was detected. 5. The mitral valve is normal in structure. Trivial mitral valve regurgitation. No evidence of mitral stenosis. 6. The aortic valve is normal  in structure. Aortic valve regurgitation is not visualized. No aortic stenosis is present. 7. Aortic dilatation noted. There is borderline dilatation of the aortic root, measuring 38 mm. Conclusion(s)/Recommendation(s): Normal biventricular function without evidence of hemodynamically significant valvular heart disease. PFO present with right to left shunt.  Diagnosing Phys: Cherlynn Kaiser MD  Cody Henson was admitted to inpatient rehabilitation on 03/23/2020 and discharged home on 04/06/2020. He's going to Outpatient Rehabilitation at [facility]. He denies any pain. He rates his pain 0. Also reports he has a good appetite.   Cody Henson reports he's had hearing loss with his stroke, this was discussed with Dr Naaman Plummer. We will place a referral  for ENT, this provider called Cody Henson regarding the above, no answer. Left message to return the call.   Pain Inventory Average Pain 0 Pain Right Now 0 My pain is no pain  LOCATION OF PAIN  No pain  BOWEL Number of stools per week: 10 Oral laxative use No  Type of laxative none Enema or suppository use No  History of colostomy No  Incontinent No   BLADDER Normal In and out cath, frequency n/a Able to self cath n/a Bladder incontinence No  Frequent urination No  Leakage with coughing No  Difficulty starting stream No  Incomplete bladder emptying No    Mobility walk without assistance how many minutes can you walk? 60 ability to climb steps?  yes do you drive?  no  Function not employed: date last employed 03/16/2020  Neuro/Psych weakness numbness  Prior Studies transitional care  Physicians involved in your care transitional care   Family History  Problem Relation Age of Onset  . Stroke Paternal Grandfather   . Stroke Maternal Aunt    Social History  Socioeconomic History  . Marital status: Married    Spouse name: Not on file  . Number of children: Not on file  . Years of education: Not on file   . Highest education level: Not on file  Occupational History  . Not on file  Tobacco Use  . Smoking status: Never Smoker  . Smokeless tobacco: Never Used  Substance and Sexual Activity  . Alcohol use: Not on file  . Drug use: Not on file  . Sexual activity: Not on file  Other Topics Concern  . Not on file  Social History Narrative  . Not on file   Social Determinants of Health   Financial Resource Strain: Not on file  Food Insecurity: Not on file  Transportation Needs: Not on file  Physical Activity: Not on file  Stress: Not on file  Social Connections: Not on file   Past Surgical History:  Procedure Laterality Date  . BUBBLE STUDY  03/21/2020   Procedure: BUBBLE STUDY;  Surgeon: [name], MD;  Location: [place];  Service: Cardiology;;  . TEE WITHOUT CARDIOVERSION N/A 03/21/2020   Procedure: TRANSESOPHAGEAL ECHOCARDIOGRAM (TEE);  Surgeon: [name], MD;  Location: [place];  Service: Cardiology;  Laterality: N/A;   Past Medical History:  Diagnosis Date  . Allergy    Phreesia 04/10/2020  . Depressed   . Hyperlipidemia    Phreesia 04/10/2020  . Hypertension    Phreesia 04/10/2020  . Neuromuscular disorder ([place])    Phreesia 04/10/2020  . Stroke ([place])    Phreesia 04/10/2020   BP 123/86   Pulse 83   Temp 98.7 F (37.1 C)   Ht 5\' 9"  (1.753 m)   Wt 156 lb (70.8 kg)   SpO2 98%   BMI 23.04 kg/m   Opioid Risk Score:   Fall Risk Score:  `1  Depression screen PHQ 2/9  No flowsheet data found.  Review of Systems  Constitutional: Negative.   HENT: Positive for hearing loss.   Eyes: Negative.   Respiratory: Negative.   Cardiovascular: Negative.   Gastrointestinal: Negative.   Endocrine: Negative.   Genitourinary: Negative.   Musculoskeletal: Negative.   Skin: Negative.   Allergic/Immunologic: Negative.   Neurological: Positive for weakness and numbness.  Hematological: Negative.   Psychiatric/Behavioral: Negative.   All other  systems reviewed and are negative.      Objective:   Physical Exam Vitals and nursing note reviewed.  Constitutional:      Appearance: Normal appearance.  Cardiovascular:     Rate and Rhythm: Normal rate and regular rhythm.     Pulses: Normal pulses.     Heart sounds: Normal heart sounds.  Pulmonary:     Effort: Pulmonary effort is normal.     Breath sounds: Normal breath sounds.  Musculoskeletal:     Cervical back: Normal range of motion and neck supple.     Comments: Normal Muscle Bulk and Muscle Testing Reveals:  Upper Extremities: Full ROM and Muscle Strength 5/5 Lower Extremities : Full ROM and Muscle Strength 5/5 Narrow Based Gait   Skin:    General: Skin is warm and dry.  Neurological:     Mental Status: He is alert and oriented to person, place, and time.  Psychiatric:        Mood and Affect: Mood normal.        Behavior: Behavior normal.           Assessment & Plan:  1.Left Middle Cerebral Artery Stroke: Hemiparesis affecting  dominant side as late effect of stroke: Continue Outpatient Theray at [facility]. He has a scheduled appointment with [facility].  2.Hypertension: Continue current medication regimen. PCP following. Continue to monitor.   F/U in 4- 6 weeks with Dr Naaman Plummer

## 2020-04-19 NOTE — Therapy (Signed)
[place] MAIN [facility] SERVICES [address] [place], [place], [address] Phone: [phone]   Fax:  [phone]  Physical Therapy Treatment  Patient Details  Name: Cody Henson MRN: [id number] Date of Birth: Jul 13, 1975 No data recorded  Encounter Date: 04/19/2020   PT End of Session - 04/19/20 1307    Visit Number 3    Number of Visits 25    Date for PT Re-Evaluation 07/05/20    PT Start Time 0102    PT Stop Time 0150    PT Time Calculation (min) 48 min    Equipment Utilized During Treatment Gait belt    Activity Tolerance Patient tolerated treatment well    Behavior During Therapy WFL for tasks assessed/performed           Past Medical History:  Diagnosis Date  . Allergy    Phreesia 04/10/2020  . Depressed   . Hyperlipidemia    Phreesia 04/10/2020  . Hypertension    Phreesia 04/10/2020  . Neuromuscular disorder ([place])    Phreesia 04/10/2020  . Stroke [facility])    Phreesia 04/10/2020    Past Surgical History:  Procedure Laterality Date  . BUBBLE STUDY  03/21/2020   Procedure: BUBBLE STUDY;  Surgeon: [name], MD;  Location: [place];  Service: Cardiology;;  . TEE WITHOUT CARDIOVERSION N/A 03/21/2020   Procedure: TRANSESOPHAGEAL ECHOCARDIOGRAM (TEE);  Surgeon: [name], MD;  Location: [place];  Service: Cardiology;  Laterality: N/A;    There were no vitals filed for this visit.   Subjective Assessment - 04/19/20 1306    Subjective Patient reports he is continuing to do well.    Pertinent History HTN    Limitations Walking;Standing    How long can you sit comfortably? unrestricted    How long can you stand comfortably? 15 mins    How long can you walk comfortably? 1 hour no AD    Patient Stated Goals be able to drive at type    Currently in Pain? No/denies    Pain Score 0-No pain           Nu Step L2 6 mins SPM >70     NEUROMUSCULAR RE-EDUCATION  In Parallel bars exercises with CS to min A  with no UE support Airex beam Tandem walk forward x 5 laps Airex beam Side stepping x5 laps  Airex pad NBOS EC 30"x2  Modified SLS opposite foot on soccer ball 30"x2 each EO/ 30"x1 each EC   [place] Balance Tux Racer- bronze set no UE support decreased weight shift to right Maze- 3 rounds, intermittent UE support decreased weight shift to right  THERAPEUTIC EXERCISE  Toy Soldiers 2# x 5 laps  Hurdle step overs with 2# ankle weight forward/backward x 8 laps no UE support Hurdle side stepping with 2# ankle weight x8 laps no UE support Step ups 6" forward x10 each no UE support Step ups 6" lateral x10 each no UE support                       PT Education - 04/19/20 1307    Education Details exercises, balance    Person(s) Educated Patient    Methods Explanation    Comprehension Verbalized understanding            PT Short Term Goals - 04/12/20 1310      PT SHORT TERM GOAL #1   Title Patient will be independent in home exercise program to  improve strength/mobility for better functional independence with ADLs.    Status New    Target Date 07/05/20             PT Long Term Goals - 04/12/20 1311      PT LONG TERM GOAL #1   Title Patient will increase FOTO score to equal to or greater than 72% to demonstrate statistically significant improvement in mobility and quality of life.    Baseline 04/12/20 56%    Status New    Target Date 07/05/20      PT LONG TERM GOAL #2   Title Patient will increase Functional Gait Assessment score to >26/30 as to reduce fall risk and improve dynamic gait safety with community ambulation.    Baseline 04/12/20 FGA 22/30    Status New    Target Date 07/05/20      PT LONG TERM GOAL #3   Title Patient will be able to navigate full flight of stairs independently with reciprocal pattern  to get to next level of home.    Baseline 04/12/20  step to pattern bilateral UE support    Status New    Target Date 07/05/20      PT LONG  TERM GOAL #4   Status --    Target Date --                 Plan - 04/19/20 1354    Clinical Impression Statement Patient progressing well overall.  The patient presents with decreased stability on right with increased ankle strategy compared to right to maintain balance.  The patient continues to benefit from additional skilled PT services to improve balance, coordination and LE strength for improved quality of life.    Personal Factors and Comorbidities Comorbidity 2    Comorbidities HTN, depression    Examination-Activity Limitations Locomotion Level;Stairs    Examination-Participation Restrictions Yard Work;Driving;Community Activity    Stability/Clinical Decision Making Evolving/Moderate complexity    Rehab Potential Good    PT Frequency 2x / week    PT Duration 12 weeks    PT Treatment/Interventions ADLs/Self Care Home Management;Gait training;Stair training;Functional mobility training;Therapeutic activities;Therapeutic exercise;Balance training;Neuromuscular re-education;Patient/family education;Passive range of motion    PT Next Visit Plan initiate strengthening and balance activites    PT Home Exercise Plan reviewed sit to stand for home    Consulted and Agree with Plan of Care Patient           Patient will benefit from skilled therapeutic intervention in order to improve the following deficits and impairments:  Abnormal gait,Decreased balance,Decreased endurance,Decreased mobility,Difficulty walking,Decreased range of motion,Improper body mechanics,Decreased activity tolerance,Decreased strength,Impaired flexibility,Postural dysfunction  Visit Diagnosis: Muscle weakness (generalized)  Other lack of coordination  Hemiparesis affecting dominant side as late effect of stroke (HCC)  Left middle cerebral artery stroke (HCC)  Unsteadiness on feet  Abnormality of gait and mobility     Problem List Patient Active Problem List   Diagnosis Date Noted  .  Transaminitis   . Hemiparesis affecting dominant side as late effect of stroke ([place])   . Blood pressure increase diastolic   . Left middle cerebral artery stroke ([place]) 03/23/2020  . Urinary retention   . Dyslipidemia   . Benign essential HTN   . Acute ischemic stroke ([place])   . Global aphasia   . Depression   . Hyperglycemia   . Right hemiparesis ([place])   . Aphasia   . Elevated blood pressure reading   . Stroke (cerebrum) ([place]) 03/17/2020  .  Enthesopathy of hip region on both sides 01/26/2019  . Chronic right shoulder pain 12/10/2014  . Scapular dyskinesis 07/29/2014  . Bursitis of right shoulder 03/01/2014    Hal Morales PT, DPT 04/19/2020, 1:58 PM  [place] MAIN [facility] SERVICES [address] [place], [place], [address] Phone: [phone]   Fax:  [phone]  Name: Cody Henson MRN: [id number] Date of Birth: Jul 06, 1975

## 2020-04-20 NOTE — Therapy (Addendum)
[place] MAIN [facility] SERVICES [address] [place], [place], [address] Phone: [phone]   Fax:  [phone]  Speech Language Pathology Treatment  Patient Details  Name: Cody Henson MRN: [id number] Date of Birth: 07-Dec-1975 Referring Provider (SLP): Reesa Chew, [place]   Encounter Date: 04/19/2020   End of Session - 04/20/20 1707    Visit Number 3    Number of Visits 25    Date for SLP Re-Evaluation 07/05/20    Authorization Type [employer]    Authorization Time Period 04/12/2020 thru 07/05/2020    Authorization - Visit Number 3    Progress Note Due on Visit 10    SLP Start Time 1400    SLP Stop Time  1500    SLP Time Calculation (min) 60 min    Activity Tolerance Patient tolerated treatment well           Past Medical History:  Diagnosis Date  . Allergy    Phreesia 04/10/2020  . Depressed   . Hyperlipidemia    Phreesia 04/10/2020  . Hypertension    Phreesia 04/10/2020  . Neuromuscular disorder ([place])    Phreesia 04/10/2020  . Stroke [facility])    Phreesia 04/10/2020    Past Surgical History:  Procedure Laterality Date  . BUBBLE STUDY  03/21/2020   Procedure: BUBBLE STUDY;  Surgeon: [name], MD;  Location: [place];  Service: Cardiology;;  . TEE WITHOUT CARDIOVERSION N/A 03/21/2020   Procedure: TRANSESOPHAGEAL ECHOCARDIOGRAM (TEE);  Surgeon: [name], MD;  Location: [place];  Service: Cardiology;  Laterality: N/A;    There were no vitals filed for this visit.   Subjective Assessment - 04/20/20 1702    Subjective pt pleasant, talkative    Currently in Pain? No/denies            Neuro   ST   TX   NOTE          Treatment Data and Patient's Response to Treatment   Skilled treatment focused on pt's express language abilities. His current goals are to be able to interview for a job without any s/s of "having had a stroke." SLP facilitated session by engaging pt in the game of "Scio?" He required moderate faded to minimal cues to slow his rate while reading the questions to improve speech intelligibility from ~ 50% to >90%.   Overall, pt's apraxia of speech was more present than in previous sessions. As a result, his speech intelligibility was decreased. Skilled ST intervention continues to be required to increase pt's speech intelligibility thereby increasing his functional independence, ability to return to work as well as participate in leisure activities.           SLP Education - 04/20/20 1706    Education Details While prognosis for continued improvement is promising, it maybe unlikely that pt's speech will forever be free of any errors    Person(s) Educated Patient;Spouse   spouse via phone   Methods Explanation;Demonstration;Verbal cues;Handout    Comprehension Verbalized understanding;Returned demonstration            SLP Short Term Goals - 04/12/20 1710      SLP SHORT TERM GOAL #1   Title Pt will utilize speech intelligibility strategies to produce speech intelligibility to >95% at the complex sentence level.    Baseline Minimal assistance for basic sentences    Time 10    Period --   sessions   Status New  SLP SHORT TERM GOAL #2   Title With moderate cues, pt will utilize word finding strategies during a description task to effective produce 3 descriptive sentences with >75%.    Baseline expressive langauge contains lots of fillers    Time 10    Period --   sessions   Status New      SLP SHORT TERM GOAL #3   Title With moderate cues, pt will increase reading comprehension by answering [facility] questions after reading semi-complex information with 50% accuracy.    Baseline maximal for work related scientific papers    Time 10    Period --   sessions   Status New      SLP SHORT TERM GOAL #4   Title With minimal cues, pt will correctly spell functional phrases with > 90% accuracy.    Baseline unable to spell greetings for email responses     Time 10    Period --   sessions   Status New            SLP Long Term Goals - 04/12/20 1719      SLP LONG TERM GOAL #1   Title Pt will utilize word finding strategies to communicate/convey abstract complex information independently.    Baseline Moderate Assistance    Time 12    Period Weeks    Status New    Target Date 07/05/20      SLP LONG TERM GOAL #2   Title Pt will use speech intelligibility strategies to achieve >95% intelligibility when conveying complex abstract information.    Baseline moderate assistance    Time 12    Period Weeks    Status New    Target Date 07/05/20      SLP LONG TERM GOAL #3   Title Pt will read complex material with > 75% comprehension.    Baseline short simple paragraph level    Time 12    Period Weeks    Status New    Target Date 07/05/20      SLP LONG TERM GOAL #4   Title Pt will compose a semi-complex email with < 25% spelling errors.    Baseline unable to spell basic greetings    Time 12    Period Weeks    Status New    Target Date 07/05/20            Plan - 04/20/20 1710        Speech Therapy Frequency 2x / week    Duration 12 weeks    Treatment/Interventions Language facilitation;Compensatory techniques;SLP instruction and feedback;Patient/family education    Potential to Achieve Goals Good    SLP Home Exercise Plan provided, see instruction section    Consulted and Agree with Plan of Care Patient           Patient will benefit from skilled therapeutic intervention in order to improve the following deficits and impairments:   Aphasia  Apraxia  Left middle cerebral artery stroke [facility])    Problem List Patient Active Problem List   Diagnosis Date Noted  . Transaminitis   . Hemiparesis affecting dominant side as late effect of stroke ([place])   . Blood pressure increase diastolic   . Left middle cerebral artery stroke ([place]) 03/23/2020  . Urinary retention   . Dyslipidemia   . Benign essential HTN   .  Acute ischemic stroke ([place])   . Global aphasia   . Depression   . Hyperglycemia   . Right hemiparesis (  [place])   . Aphasia   . Elevated blood pressure reading   . Stroke (cerebrum) ([place]) 03/17/2020  . Enthesopathy of hip region on both sides 01/26/2019  . Chronic right shoulder pain 12/10/2014  . Scapular dyskinesis 07/29/2014  . Bursitis of right shoulder 03/01/2014   Asra Gambrel B. Rutherford Nail M.S., CCC-SLP, [place] Pathologist Rehabilitation Services Office [phone]  Stormy Fabian 04/20/2020, 5:18 PM  [place] MAIN [facility] SERVICES [address] [place], [place], [address] Phone: [phone]   Fax:  [phone]   Name: Cody Henson MRN: [id number] Date of Birth: 1975/12/09

## 2020-04-20 NOTE — Patient Instructions (Signed)
To help pt generate grammatically correct sentences, recommend using a talk to text app as well as talk to text when reading scientific articles to increase his ability to retain important facts

## 2020-04-21 ENCOUNTER — Ambulatory Visit: Payer: BC Managed Care – PPO | Admitting: Speech Pathology

## 2020-04-21 ENCOUNTER — Other Ambulatory Visit: Payer: Self-pay

## 2020-04-21 ENCOUNTER — Ambulatory Visit: Payer: BC Managed Care – PPO | Admitting: Occupational Therapy

## 2020-04-21 ENCOUNTER — Ambulatory Visit: Payer: BC Managed Care – PPO

## 2020-04-21 DIAGNOSIS — R269 Unspecified abnormalities of gait and mobility: Secondary | ICD-10-CM

## 2020-04-21 DIAGNOSIS — I63512 Cerebral infarction due to unspecified occlusion or stenosis of left middle cerebral artery: Secondary | ICD-10-CM

## 2020-04-21 DIAGNOSIS — R2681 Unsteadiness on feet: Secondary | ICD-10-CM

## 2020-04-21 DIAGNOSIS — M6281 Muscle weakness (generalized): Secondary | ICD-10-CM | POA: Diagnosis not present

## 2020-04-21 DIAGNOSIS — R278 Other lack of coordination: Secondary | ICD-10-CM

## 2020-04-21 DIAGNOSIS — R482 Apraxia: Secondary | ICD-10-CM

## 2020-04-21 DIAGNOSIS — I69359 Hemiplegia and hemiparesis following cerebral infarction affecting unspecified side: Secondary | ICD-10-CM | POA: Diagnosis not present

## 2020-04-21 DIAGNOSIS — R4701 Aphasia: Secondary | ICD-10-CM

## 2020-04-21 NOTE — Patient Instructions (Signed)
Compile response to 2 interview questions

## 2020-04-21 NOTE — Therapy (Signed)
[place] MAIN [facility] SERVICES [address] [place], [place], [address] Phone: [phone]   Fax:  [phone]  Speech Language Pathology Treatment Re-Certification - Change in plan of care  Patient Details  Name: Cody Henson MRN: [id number] Date of Birth: 1975/09/15 Referring Provider (SLP): Reesa Chew, [place]   Encounter Date: 04/21/2020   End of Session - 04/21/20 1618    Visit Number 4    Number of Visits 25    Date for SLP Re-Evaluation 07/05/20    Authorization Type [employer]    Authorization Time Period 04/12/2020 thru 07/05/2020    Authorization - Visit Number 4    Progress Note Due on Visit 10    SLP Start Time 1300    SLP Stop Time  1400    SLP Time Calculation (min) 60 min    Activity Tolerance Patient tolerated treatment well           Past Medical History:  Diagnosis Date  . Allergy    Phreesia 04/10/2020  . Depressed   . Hyperlipidemia    Phreesia 04/10/2020  . Hypertension    Phreesia 04/10/2020  . Neuromuscular disorder ([place])    Phreesia 04/10/2020  . Stroke [facility])    Phreesia 04/10/2020    Past Surgical History:  Procedure Laterality Date  . BUBBLE STUDY  03/21/2020   Procedure: BUBBLE STUDY;  Surgeon: [name], MD;  Location: [place];  Service: Cardiology;;  . TEE WITHOUT CARDIOVERSION N/A 03/21/2020   Procedure: TRANSESOPHAGEAL ECHOCARDIOGRAM (TEE);  Surgeon: [name], MD;  Location: [place];  Service: Cardiology;  Laterality: N/A;    There were no vitals filed for this visit.   Subjective Assessment - 04/21/20 1616    Subjective pt pleasant, talkative    Currently in Pain? No/denies            Neuro   ST   TX   NOTE          Treatment Data and Patient's Response to Treatment   Skilled treatment focused on pt's expressive communication specifically in an interview setting. Pt brought in his resume as well as a book with interview questions. SLP facilitiated  session by asking pt basic interview questions. Pt had immense difficulty organizing his thoughts to provide cohesive responses. Later pt stated that prior to his stroke, he had difficulty with working memory. As such, he would compile answers to potential interview questions, would double check his responses again information found on Google and then he would study/rehearse his responses. Pt will compile responses to 2 additional interview questions in preparation for his next session.   When discussing pt's ST POC, he voiced desire to reduce ST services to 1 x week d/t number of sessions allowed by his insurance. As such, re-certification submitted to reflect changes made in pt's POC.        SLP Education - 04/21/20 1617    Education Details apraxia of speech    Person(s) Educated Patient    Methods Explanation;Demonstration    Comprehension Verbalized understanding;Need further instruction            SLP Short Term Goals - 04/12/20 1710      SLP SHORT TERM GOAL #1   Title Pt will utilize speech intelligibility strategies to produce speech intelligibility to >95% at the complex sentence level.    Baseline Minimal assistance for basic sentences    Time 10    Period --   sessions  Status New      SLP SHORT TERM GOAL #2   Title With moderate cues, pt will utilize word finding strategies during a description task to effective produce 3 descriptive sentences with >75%.    Baseline expressive langauge contains lots of fillers    Time 10    Period --   sessions   Status New      SLP SHORT TERM GOAL #3   Title With moderate cues, pt will increase reading comprehension by answering [facility] questions after reading semi-complex information with 50% accuracy.    Baseline maximal for work related scientific papers    Time 10    Period --   sessions   Status New      SLP SHORT TERM GOAL #4   Title With minimal cues, pt will correctly spell functional phrases with > 90% accuracy.     Baseline unable to spell greetings for email responses    Time 10    Period --   sessions   Status New            SLP Long Term Goals - 04/12/20 1719      SLP LONG TERM GOAL #1   Title Pt will utilize word finding strategies to communicate/convey abstract complex information independently.    Baseline Moderate Assistance    Time 12    Period Weeks    Status New    Target Date 07/05/20      SLP LONG TERM GOAL #2   Title Pt will use speech intelligibility strategies to achieve >95% intelligibility when conveying complex abstract information.    Baseline moderate assistance    Time 12    Period Weeks    Status New    Target Date 07/05/20      SLP LONG TERM GOAL #3   Title Pt will read complex material with > 75% comprehension.    Baseline short simple paragraph level    Time 12    Period Weeks    Status New    Target Date 07/05/20      SLP LONG TERM GOAL #4   Title Pt will compose a semi-complex email with < 25% spelling errors.    Baseline unable to spell basic greetings    Time 12    Period Weeks    Status New    Target Date 07/05/20            Plan - 04/21/20 1619    Speech Therapy Frequency 1x /week    Duration 12 weeks    Treatment/Interventions Language facilitation;Compensatory techniques;SLP instruction and feedback;Patient/family education    Potential to Achieve Goals Good    SLP Home Exercise Plan provided, see instruction section    Consulted and Agree with Plan of Care Patient           Patient will benefit from skilled therapeutic intervention in order to improve the following deficits and impairments:   Aphasia  Apraxia  Left middle cerebral artery stroke [facility])    Problem List Patient Active Problem List   Diagnosis Date Noted  . Transaminitis   . Hemiparesis affecting dominant side as late effect of stroke ([place])   . Blood pressure increase diastolic   . Left middle cerebral artery stroke ([place]) 03/23/2020  . Urinary retention   .  Dyslipidemia   . Benign essential HTN   . Acute ischemic stroke ([place])   . Global aphasia   . Depression   . Hyperglycemia   .  Right hemiparesis ([place])   . Aphasia   . Elevated blood pressure reading   . Stroke (cerebrum) ([place]) 03/17/2020  . Enthesopathy of hip region on both sides 01/26/2019  . Chronic right shoulder pain 12/10/2014  . Scapular dyskinesis 07/29/2014  . Bursitis of right shoulder 03/01/2014   Aubry Rankin B. Rutherford Nail M.S., CCC-SLP, [place] Pathologist Rehabilitation Services Office [phone]  Stormy Fabian 04/21/2020, 4:29 PM  [place] MAIN [facility] SERVICES [address] [place], [place], [address] Phone: [phone]   Fax:  [phone]   Name: Kaydan Wong MRN: [id number] Date of Birth: 09/04/1975

## 2020-04-21 NOTE — Therapy (Signed)
[place] MAIN [facility] SERVICES [address] [place], [place], [address] Phone: [phone]   Fax:  [phone]  Physical Therapy Treatment  Patient Details  Name: Cody Henson MRN: [id number] Date of Birth: 1975-12-05 No data recorded  Encounter Date: 04/21/2020   PT End of Session - 04/21/20 1356    Visit Number 4    Number of Visits 25    Date for PT Re-Evaluation 07/05/20    PT Start Time 0250    PT Stop Time 0328    PT Time Calculation (min) 38 min    Equipment Utilized During Treatment Gait belt    Activity Tolerance Patient tolerated treatment well    Behavior During Therapy WFL for tasks assessed/performed           Past Medical History:  Diagnosis Date  . Allergy    Phreesia 04/10/2020  . Depressed   . Hyperlipidemia    Phreesia 04/10/2020  . Hypertension    Phreesia 04/10/2020  . Neuromuscular disorder ([place])    Phreesia 04/10/2020  . Stroke [facility])    Phreesia 04/10/2020    Past Surgical History:  Procedure Laterality Date  . BUBBLE STUDY  03/21/2020   Procedure: BUBBLE STUDY;  Surgeon: [name], MD;  Location: [place];  Service: Cardiology;;  . TEE WITHOUT CARDIOVERSION N/A 03/21/2020   Procedure: TRANSESOPHAGEAL ECHOCARDIOGRAM (TEE);  Surgeon: [name], MD;  Location: [place];  Service: Cardiology;  Laterality: N/A;    There were no vitals filed for this visit.   Subjective Assessment - 04/21/20 1500    Subjective The patient states he is doing well a couple minor LOB at home but was able to catch self.Marland Kitchen    Pertinent History HTN    Limitations Walking;Standing    How long can you sit comfortably? unrestricted    How long can you stand comfortably? 15 mins    How long can you walk comfortably? 1 hour no AD    Patient Stated Goals be able to drive at type    Currently in Pain? No/denies    Pain Score 0-No pain             Nu Step L2 8 mins SPM >70   NEUROMUSCULAR  RE-EDUCATION  Airex pad tandem balance with ball hits (multi-colored ball) to wall left then right x10 each side (min CGA) Airex pad NBOS stance with ball hits to wall left then right x10 (min CGA) SLS with ball pass to wall 2x10 throws each LE (min CGA)   [place] Balance Maze- 3 rounds no UE support, improved weight shift to right  THERAPEUTIC EXERCISE  Toy Soldiers 2# x 5 laps no UE support Hurdle step overs with 2# ankle weight forward/backward x 8 laps no UE support Hurdle side stepping with 2# ankle weight x8 laps no UE support Step ups 6" forward x10 each LE no UE support                          PT Education - 04/21/20 1501    Education Details exercise technique, balance    Person(s) Educated Patient    Methods Explanation    Comprehension Verbalized understanding            PT Short Term Goals - 04/12/20 1310      PT SHORT TERM GOAL #1   Title Patient will be independent in home exercise program to improve strength/mobility for  better functional independence with ADLs.    Status New    Target Date 07/05/20             PT Long Term Goals - 04/12/20 1311      PT LONG TERM GOAL #1   Title Patient will increase FOTO score to equal to or greater than 72% to demonstrate statistically significant improvement in mobility and quality of life.    Baseline 04/12/20 56%    Status New    Target Date 07/05/20      PT LONG TERM GOAL #2   Title Patient will increase Functional Gait Assessment score to >26/30 as to reduce fall risk and improve dynamic gait safety with community ambulation.    Baseline 04/12/20 FGA 22/30    Status New    Target Date 07/05/20      PT LONG TERM GOAL #3   Title Patient will be able to navigate full flight of stairs independently with reciprocal pattern  to get to next level of home.    Baseline 04/12/20  step to pattern bilateral UE support    Status New    Target Date 07/05/20      PT LONG TERM GOAL #4   Status --     Target Date --                 Plan - 04/21/20 1455    Clinical Impression Statement Patient with improved ability to weight shift to the right with balance activities. Patient was challenged with dynamic balace with need for CGA and occasional step to regain stability.  The patient continues to bneefit from additional skilled PT services to improve LE strength and balance for improved functional capcity.    Personal Factors and Comorbidities Comorbidity 2    Comorbidities HTN, depression    Examination-Activity Limitations Locomotion Level;Stairs    Examination-Participation Restrictions Yard Work;Driving;Community Activity    Stability/Clinical Decision Making Evolving/Moderate complexity    Rehab Potential Good    PT Frequency 2x / week    PT Duration 12 weeks    PT Treatment/Interventions ADLs/Self Care Home Management;Gait training;Stair training;Functional mobility training;Therapeutic activities;Therapeutic exercise;Balance training;Neuromuscular re-education;Patient/family education;Passive range of motion    PT Next Visit Plan initiate strengthening and balance activites    PT Home Exercise Plan reviewed sit to stand for home    Consulted and Agree with Plan of Care Patient           Patient will benefit from skilled therapeutic intervention in order to improve the following deficits and impairments:  Abnormal gait,Decreased balance,Decreased endurance,Decreased mobility,Difficulty walking,Decreased range of motion,Improper body mechanics,Decreased activity tolerance,Decreased strength,Impaired flexibility,Postural dysfunction  Visit Diagnosis: Left middle cerebral artery stroke ([facility])  Muscle weakness (generalized)  Other lack of coordination  Unsteadiness on feet  Abnormality of gait and mobility     Problem List Patient Active Problem List   Diagnosis Date Noted  . Transaminitis   . Hemiparesis affecting dominant side as late effect of stroke ([place])   .  Blood pressure increase diastolic   . Left middle cerebral artery stroke ([place]) 03/23/2020  . Urinary retention   . Dyslipidemia   . Benign essential HTN   . Acute ischemic stroke ([place])   . Global aphasia   . Depression   . Hyperglycemia   . Right hemiparesis ([place])   . Aphasia   . Elevated blood pressure reading   . Stroke (cerebrum) ([place]) 03/17/2020  . Enthesopathy of hip region on both sides 01/26/2019  .  Chronic right shoulder pain 12/10/2014  . Scapular dyskinesis 07/29/2014  . Bursitis of right shoulder 03/01/2014    Hal Morales PT, DPT 04/21/2020, 3:45 PM  [place] MAIN [facility] SERVICES [address] [place], [place], [address] Phone: [phone]   Fax:  [phone]  Name: Cody Henson MRN: [id number] Date of Birth: 09/20/1975

## 2020-04-22 NOTE — Therapy (Signed)
[place] MAIN [facility] SERVICES [address] [place], [place], [address] Phone: [phone]   Fax:  [phone]  Occupational Therapy Treatment  Patient Details  Name: Cody Henson MRN: [id number] Date of Birth: May 25, 1975 No data recorded  Encounter Date: 04/19/2020   OT End of Session - 04/21/20 0912    Visit Number 3    Number of Visits 24    Date for OT Re-Evaluation 07/05/20    OT Start Time 1501    OT Stop Time 1555    OT Time Calculation (min) 54 min    Activity Tolerance Patient tolerated treatment well    Behavior During Therapy [facility] for tasks assessed/performed           Past Medical History:  Diagnosis Date  . Allergy    Phreesia 04/10/2020  . Depressed   . Hyperlipidemia    Phreesia 04/10/2020  . Hypertension    Phreesia 04/10/2020  . Neuromuscular disorder ([place])    Phreesia 04/10/2020  . Stroke [facility])    Phreesia 04/10/2020    Past Surgical History:  Procedure Laterality Date  . BUBBLE STUDY  03/21/2020   Procedure: BUBBLE STUDY;  Surgeon: [name], MD;  Location: [place];  Service: Cardiology;;  . TEE WITHOUT CARDIOVERSION N/A 03/21/2020   Procedure: TRANSESOPHAGEAL ECHOCARDIOGRAM (TEE);  Surgeon: [name], MD;  Location: [place];  Service: Cardiology;  Laterality: N/A;    There were no vitals filed for this visit.   Subjective Assessment - 04/22/20 0911    Subjective  Pt reports he is doing well, would like to be able to use his right hand better for picking up and manipulating tasks.    Pertinent History Per medial records, Cody Henson is a 45 y.o. male with history of depression who was admitted on 03/16/20 with right sided weakness, facial weakness and aphasia. CT head showed evolving hypodensity in mid/posterior left frontal lobe and CTA was negative for LVO. UDS showed THC. He received tPA and follow up MRI showed patchy multifocal acute ischemic infarcts in left cerebral hemisphere  without hemorrhagic transformation.    Patient Stated Goals Pt would like to be as independent as possible, wants to drive and return to work.    Currently in Pain? No/denies    Pain Score 0-No pain          Therapeutic Exercise:  Patient seen for RUE strengthening with use of UBE in standing, forwards/backwards directions, alternating levels of resistance from 2.0 to 2.5, performed in increments of 2 mins with short rest breaks between for multiple trials.  Therapist in constant attendance to ensure grip and to adjust settings.    Neuromuscular Reeducation:  (Right UE)  Use of small stick dowels and demonstrates difficulty with manipulation and picking up from flat surface, verbal cue for prehension patterns, performs best with thumb and index oppositional movements but has difficulty with coordination and inclusion of middle finger with manipulation of items.  Manipulation of marble string with moving thumb up and down length of the taped string, emphasis on thumb coordination on right.   Attempted to pick up and manipulate large jacks however unable to perform at this time. Instructed on use of playing cards for manipulation with emphasis on thumb and finger combinations.  Pt performing some card flipping with thumb on top and flipping away, while then using fingers on top and flipping towards him.  Difficulty with shuffling and coordinating use of middle finger into task.  Has to slide cards to edge of table to pick up with right hand.    Response to tx: Pt very engaged in task and motivated to work on his right arm and hand.  Continues to demonstrate right sided limb/hand apraxia with attempts to engage in manipulation of a variety of tasks.  Pt demonstrates understanding of tasks and focus and has gone home each session and works on finding ways to simulate activities performed in his session.  Will continue to work towards providing ideas of home exercises to perform between sessions at  home for a more targeted focus.  Continue to work towards goals in plan of care to increase functional independence in daily tasks.                       OT Education - 04/21/20 0912    Education Details fine motor coordination, proprioception, safety    Person(s) Educated Patient    Methods Explanation;Demonstration    Comprehension Verbalized understanding;Returned demonstration               OT Long Term Goals - 04/16/20 2040      OT LONG TERM GOAL #1   Title Pt will be independent with home exercise program    Baseline no program at eval    Time 12    Period Weeks    Status New    Target Date 07/05/20      OT LONG TERM GOAL #2   Title Pt will complete dressing skills, UE and LE with modified independence    Baseline difficulty with socks and shoes at times, unable to tie laces    Time 6    Period Weeks    Status New    Target Date 05/24/20      OT LONG TERM GOAL #3   Title Pt will improve right grip strength by 15# to assist with cutting meat with modified independence.    Baseline Eval unable    Time 12    Period Weeks    Status New    Target Date 07/06/18      OT LONG TERM GOAL #4   Title Pt will improve right hand coordination to brush teeth without difficulty.    Baseline Eval:has to use left hand currently    Time 6    Period Weeks    Status New    Target Date 05/24/20      OT LONG TERM GOAL #5   Title Patient will improve typing speed greater than 12 WPM to answer emails and perform work tasks.    Baseline Eval:  adjust WPM 4.    Time 12    Period Weeks    Status New    Target Date 07/05/20      Long Term Additional Goals   Additional Long Term Goals Yes      OT LONG TERM GOAL #6   Title Patient will improve strength in right hand to pick up 45 yo daughter from high chair.    Baseline unable at eval    Time 12    Period Weeks    Status New    Target Date 07/05/20      OT LONG TERM GOAL #7   Title Patient will improve hand  function to write and sign his name on important papers with legibilty greater than 50%    Baseline Eval: 25%, poor legibility    Time 12    Period Weeks  Status New    Target Date 07/05/20      OT LONG TERM GOAL #8   Title Pt will improve FOTO score to 65 or greater to show clinically relevant change to engage in ADL and IADL tasks with greater independence.    Baseline Eval 51    Time 12    Period Weeks    Status New    Target Date 07/05/20                 Plan - 04/21/20 0914    Clinical Impression Statement Pt very engaged in task and motivated to work on his right arm and hand.  Continues to demonstrate right sided limb/hand apraxia with attempts to engage in manipulation of a variety of tasks.  Pt demonstrates understanding of tasks and focus and has gone home each session and works on finding ways to simulate activities performed in his session.  Will continue to work towards providing ideas of home exercises to perform between sessions at home for a more targeted focus.  Continue to work towards goals in plan of care to increase functional independence in daily tasks.    OT Occupational Profile and History Detailed Assessment- Review of Records and additional review of physical, cognitive, psychosocial history related to current functional performance    Occupational performance deficits (Please refer to evaluation for details): ADL's;IADL's;Work;Leisure;Social Participation    Body Structure / Function / Physical Skills ADL;Dexterity;Strength;Balance;Coordination;FMC;IADL;Endurance;Sensation;UE functional use;Proprioception    Psychosocial Skills Environmental  Adaptations;Habits;Routines and Behaviors    Rehab Potential Good    Clinical Decision Making Several treatment options, min-mod task modification necessary    Comorbidities Affecting Occupational Performance: May have comorbidities impacting occupational performance    Modification or Assistance to Complete  Evaluation  No modification of tasks or assist necessary to complete eval    OT Frequency 2x / week    OT Duration 12 weeks    OT Treatment/Interventions Self-care/ADL training;Cryotherapy;Therapeutic exercise;DME and/or AE instruction;Balance training;Neuromuscular education;Manual Therapy;Moist Heat;Contrast Bath;Therapeutic activities;Patient/family education    Consulted and Agree with Plan of Care Patient           Patient will benefit from skilled therapeutic intervention in order to improve the following deficits and impairments:   Body Structure / Function / Physical Skills: ADL,Dexterity,Strength,Balance,Coordination,FMC,IADL,Endurance,Sensation,UE functional use,Proprioception   Psychosocial Skills: Environmental  Adaptations,Habits,Routines and Behaviors   Visit Diagnosis: Muscle weakness (generalized)  Other lack of coordination  Hemiparesis affecting dominant side as late effect of stroke [facility])    Problem List Patient Active Problem List   Diagnosis Date Noted  . Transaminitis   . Hemiparesis affecting dominant side as late effect of stroke ([place])   . Blood pressure increase diastolic   . Left middle cerebral artery stroke ([place]) 03/23/2020  . Urinary retention   . Dyslipidemia   . Benign essential HTN   . Acute ischemic stroke ([place])   . Global aphasia   . Depression   . Hyperglycemia   . Right hemiparesis ([place])   . Aphasia   . Elevated blood pressure reading   . Stroke (cerebrum) ([place]) 03/17/2020  . Enthesopathy of hip region on both sides 01/26/2019  . Chronic right shoulder pain 12/10/2014  . Scapular dyskinesis 07/29/2014  . Bursitis of right shoulder 03/01/2014   Mahari Vankirk T Tomasita Morrow, OTR/L, CLT  Jethro Radke 04/22/2020, 9:28 AM  [place] MAIN [facility] SERVICES [address] [place], [place], [address] Phone: [phone]   Fax:  [phone]  Name: Kameran  Defenbaugh MRN: [id number] Date of Birth: Nov 29, 1975

## 2020-04-23 ENCOUNTER — Encounter: Payer: Self-pay | Admitting: Occupational Therapy

## 2020-04-23 NOTE — Therapy (Signed)
[place] MAIN [facility] SERVICES [address] [place], [place], [address] Phone: [phone]   Fax:  [phone]  Occupational Therapy Treatment  Patient Details  Name: Cody Henson MRN: [id number] Date of Birth: 12/13/1975 No data recorded  Encounter Date: 04/21/2020   OT End of Session - 04/23/20 1108    Visit Number 4    Number of Visits 24    Date for OT Re-Evaluation 07/05/20    OT Start Time 1400    OT Stop Time 1445    OT Time Calculation (min) 45 min    Activity Tolerance Patient tolerated treatment well    Behavior During Therapy [facility] for tasks assessed/performed           Past Medical History:  Diagnosis Date  . Allergy    Phreesia 04/10/2020  . Depressed   . Hyperlipidemia    Phreesia 04/10/2020  . Hypertension    Phreesia 04/10/2020  . Neuromuscular disorder ([place])    Phreesia 04/10/2020  . Stroke [facility])    Phreesia 04/10/2020    Past Surgical History:  Procedure Laterality Date  . BUBBLE STUDY  03/21/2020   Procedure: BUBBLE STUDY;  Surgeon: [name], MD;  Location: [place];  Service: Cardiology;;  . TEE WITHOUT CARDIOVERSION N/A 03/21/2020   Procedure: TRANSESOPHAGEAL ECHOCARDIOGRAM (TEE);  Surgeon: [name], MD;  Location: [place];  Service: Cardiology;  Laterality: N/A;    There were no vitals filed for this visit.   Subjective Assessment - 04/23/20 1106    Subjective  Pt reports he brought in a tool set to work on today, would like to know how he should use it at home as a part of his home program.    Pertinent History Per medial records, Cody Henson is a 45 y.o. male with history of depression who was admitted on 03/16/20 with right sided weakness, facial weakness and aphasia. CT head showed evolving hypodensity in mid/posterior left frontal lobe and CTA was negative for LVO. UDS showed THC. He received tPA and follow up MRI showed patchy multifocal acute ischemic infarcts in left cerebral  hemisphere without hemorrhagic transformation.    Patient Stated Goals Pt would like to be as independent as possible, wants to drive and return to work.    Currently in Pain? No/denies    Pain Score 0-No pain           Therapeutic Exercise:  Pt engaging in strengthening exercises in standing with use of UBE, resistance varied from 2.5 to 3.0, forwards/backwards alternating levels of resistance for 8 mins total, therapist in constant attendance to ensure grip and adjust setting.  Neuromuscular Reeducation:  Pt brought in [employer] tool kit with 7 different insert screws, tools included:  screwdriver, hex key, and wrench.  Assessed patient use with each type of screw as well as tools.  Pt unable to use screwdriver at this time, also attempted larger screwdriver for full hand grasp however patient could not coordinate movement patterns to complete.  Advised patient on which insert screws to focus on at this time (larger head ones) and type of movement patterns to utilize.  Continues to demo difficulty with tripod prehension especially at middle finger.  Use of PVC pipe elbows to help with facilitation for tripod grasping, patient utilizing vision and often switching hand to see how left hand performs normal movement pattern.   Grasping of jumbo pegs to place into board and simulate turning motion, increased effort required.  Discussed use of spray bottle at home to help facilitate 3 point prehension patterns with resistance.    Response to tx: Pt highly motivated and bringing in items to use in therapy and asking how he can use items at home as a part of his home program.  He responds well to instruction and cues.  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  He is currently using compensatory strategies with vision, focused attention, overflow principles from left UE when attempting tasks.  Continue to  work towards goals in plan of care to increase RUE functional use for ADL and IADL tasks.                 OT Education - 04/23/20 1108    Education Details fine motor coordination, proprioception, safety    Person(s) Educated Patient    Methods Explanation;Demonstration    Comprehension Verbalized understanding;Returned demonstration               OT Long Term Goals - 04/16/20 2040      OT LONG TERM GOAL #1   Title Pt will be independent with home exercise program    Baseline no program at eval    Time 12    Period Weeks    Status New    Target Date 07/05/20      OT LONG TERM GOAL #2   Title Pt will complete dressing skills, UE and LE with modified independence    Baseline difficulty with socks and shoes at times, unable to tie laces    Time 6    Period Weeks    Status New    Target Date 05/24/20      OT LONG TERM GOAL #3   Title Pt will improve right grip strength by 15# to assist with cutting meat with modified independence.    Baseline Eval unable    Time 12    Period Weeks    Status New    Target Date 07/06/18      OT LONG TERM GOAL #4   Title Pt will improve right hand coordination to brush teeth without difficulty.    Baseline Eval:has to use left hand currently    Time 6    Period Weeks    Status New    Target Date 05/24/20      OT LONG TERM GOAL #5   Title Patient will improve typing speed greater than 12 WPM to answer emails and perform work tasks.    Baseline Eval:  adjust WPM 4.    Time 12    Period Weeks    Status New    Target Date 07/05/20      Long Term Additional Goals   Additional Long Term Goals Yes      OT LONG TERM GOAL #6   Title Patient will improve strength in right hand to pick up 35 yo daughter from high chair.    Baseline unable at eval    Time 12    Period Weeks    Status New    Target Date 07/05/20      OT LONG TERM GOAL #7   Title Patient will improve hand function to write and sign his name on important  papers with legibilty greater than 50%    Baseline Eval: 25%, poor legibility    Time 12    Period Weeks    Status New  Target Date 07/05/20      OT LONG TERM GOAL #8   Title Pt will improve FOTO score to 65 or greater to show clinically relevant change to engage in ADL and IADL tasks with greater independence.    Baseline Eval 51    Time 12    Period Weeks    Status New    Target Date 07/05/20                 Plan - 04/23/20 1110    Clinical Impression Statement Pt highly motivated and bringing in items to use in therapy and asking how he can use items at home as a part of his home program.  He responds well to instruction and cues.  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  He is currently using compensatory strategies with vision, focused attention, overflow principles from left UE when attempting tasks.  Continue to work towards goals in plan of care to increase RUE functional use for ADL and IADL tasks.    OT Occupational Profile and History Detailed Assessment- Review of Records and additional review of physical, cognitive, psychosocial history related to current functional performance    Occupational performance deficits (Please refer to evaluation for details): ADL's;IADL's;Work;Leisure;Social Participation    Body Structure / Function / Physical Skills ADL;Dexterity;Strength;Balance;Coordination;FMC;IADL;Endurance;Sensation;UE functional use;Proprioception    Psychosocial Skills Environmental  Adaptations;Habits;Routines and Behaviors    Rehab Potential Good    Clinical Decision Making Several treatment options, min-mod task modification necessary    Comorbidities Affecting Occupational Performance: May have comorbidities impacting occupational performance    Modification or Assistance to Complete Evaluation  No modification of tasks or assist necessary to complete eval    OT  Frequency 2x / week    OT Duration 12 weeks    OT Treatment/Interventions Self-care/ADL training;Cryotherapy;Therapeutic exercise;DME and/or AE instruction;Balance training;Neuromuscular education;Manual Therapy;Moist Heat;Contrast Bath;Therapeutic activities;Patient/family education    Consulted and Agree with Plan of Care Patient           Patient will benefit from skilled therapeutic intervention in order to improve the following deficits and impairments:   Body Structure / Function / Physical Skills: ADL,Dexterity,Strength,Balance,Coordination,FMC,IADL,Endurance,Sensation,UE functional use,Proprioception   Psychosocial Skills: Environmental  Adaptations,Habits,Routines and Behaviors   Visit Diagnosis: Muscle weakness (generalized)  Other lack of coordination  Left middle cerebral artery stroke [facility])    Problem List Patient Active Problem List   Diagnosis Date Noted  . Transaminitis   . Hemiparesis affecting dominant side as late effect of stroke ([place])   . Blood pressure increase diastolic   . Left middle cerebral artery stroke ([place]) 03/23/2020  . Urinary retention   . Dyslipidemia   . Benign essential HTN   . Acute ischemic stroke ([place])   . Global aphasia   . Depression   . Hyperglycemia   . Right hemiparesis ([place])   . Aphasia   . Elevated blood pressure reading   . Stroke (cerebrum) ([place]) 03/17/2020  . Enthesopathy of hip region on both sides 01/26/2019  . Chronic right shoulder pain 12/10/2014  . Scapular dyskinesis 07/29/2014  . Bursitis of right shoulder 03/01/2014   Cody Henson, OTR/L, CLT  Cody Henson 04/23/2020, 7:03 PM  [place] MAIN [facility] SERVICES [address] [place], [place], [address] Phone: [phone]   Fax:  [phone]  Name: Cody Henson MRN: [id number] Date of Birth: 18-Apr-1975

## 2020-04-26 ENCOUNTER — Ambulatory Visit: Payer: BC Managed Care – PPO | Admitting: Speech Pathology

## 2020-04-26 ENCOUNTER — Other Ambulatory Visit: Payer: Self-pay

## 2020-04-26 ENCOUNTER — Ambulatory Visit: Payer: BC Managed Care – PPO

## 2020-04-26 ENCOUNTER — Ambulatory Visit: Payer: BC Managed Care – PPO | Admitting: Occupational Therapy

## 2020-04-26 DIAGNOSIS — R278 Other lack of coordination: Secondary | ICD-10-CM

## 2020-04-26 DIAGNOSIS — I69359 Hemiplegia and hemiparesis following cerebral infarction affecting unspecified side: Secondary | ICD-10-CM

## 2020-04-26 DIAGNOSIS — I63512 Cerebral infarction due to unspecified occlusion or stenosis of left middle cerebral artery: Secondary | ICD-10-CM | POA: Diagnosis not present

## 2020-04-26 DIAGNOSIS — M6281 Muscle weakness (generalized): Secondary | ICD-10-CM | POA: Diagnosis not present

## 2020-04-26 DIAGNOSIS — R269 Unspecified abnormalities of gait and mobility: Secondary | ICD-10-CM | POA: Diagnosis not present

## 2020-04-26 DIAGNOSIS — R482 Apraxia: Secondary | ICD-10-CM | POA: Diagnosis not present

## 2020-04-26 DIAGNOSIS — R4701 Aphasia: Secondary | ICD-10-CM

## 2020-04-26 DIAGNOSIS — R2681 Unsteadiness on feet: Secondary | ICD-10-CM | POA: Diagnosis not present

## 2020-04-26 NOTE — Therapy (Signed)
[place] [facility] MAIN [place] SERVICES [address] [place], [place], [address] Phone: [phone]   Fax:  [phone]  Occupational Therapy Treatment  Patient Details  Name: Cody Henson MRN: [id number] Date of Birth: [birth year] No data recorded  Encounter Date: 04/26/2020   OT End of Session - 04/27/20 1324    Visit Number 5    Number of Visits 24    Date for OT Re-Evaluation 07/05/20    OT Start Time 0840    OT Stop Time 0915    OT Time Calculation (min) 35 min    Activity Tolerance Patient tolerated treatment well    Behavior During Therapy [facility] for tasks assessed/performed           Past Medical History:  Diagnosis Date  . Allergy    Phreesia 04/10/2020  . Depressed   . Hyperlipidemia    Phreesia 04/10/2020  . Hypertension    Phreesia 04/10/2020  . Neuromuscular disorder ([facility])    Phreesia 04/10/2020  . Stroke [facility])    Phreesia 04/10/2020    Past Surgical History:  Procedure Laterality Date  . BUBBLE STUDY  03/21/2020   Procedure: BUBBLE STUDY;  Surgeon: Parke Poisson, MD;  Location: [facility] ENDOSCOPY;  Service: Cardiology;;  . TEE WITHOUT CARDIOVERSION N/A 03/21/2020   Procedure: TRANSESOPHAGEAL ECHOCARDIOGRAM (TEE);  Surgeon: Parke Poisson, MD;  Location: [facility] ENDOSCOPY;  Service: Cardiology;  Laterality: N/A;    There were no vitals filed for this visit.   Subjective Assessment - 04/28/20 0820    Subjective  Pt's appt got mixed up today and started a little late.  Pt brought in work related items for therapist assessment.    Pertinent History Per medial records, Cody Henson is a 45 y.o. male with history of depression who was admitted on 03/16/20 with right sided weakness, facial weakness and aphasia. CT head showed evolving hypodensity in mid/posterior left frontal lobe and CTA was negative for LVO. UDS showed THC. He received tPA and follow up MRI showed patchy multifocal acute ischemic infarcts in left cerebral hemisphere  without hemorrhagic transformation.    Patient Stated Goals Pt would like to be as independent as possible, wants to drive and return to work.    Currently in Pain? No/denies    Pain Score 0-No pain           ADL: Pt reports he has been trying to do things around the house, working more in the garage and outdoors.  Reports he was trying to prune some bushes, stood on small step stool ladder.  Discussed safety issues with patient and recommendations to not use sharp objects due to decreased sensation and proprioception in his RUE as well as safety with ladder.  Also discussed with handoff to PT to further address any balance related issues. Pt had difficulty with task and states a family member will be coming to help him tomorrow.  Recommended he have the family member use pruning shears and patient can help by gathering the clipping and cleaning after.    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  Review and analysis of items, pipet, covers for pipet, small vials for serum samples and trays.  Patient verbalizing the steps and attempting to demonstrate the skill required to complete the task.  Pt uses pipet in his right hand with a cylindrical grasping patterns with the thumb on top.  The thumb has to have sufficient control to  operate the pipet, pushing down with thumb in 2 positions, 1/2 way stop and full press.  This motion requires a significant amount of thumb strength, repetitive movement pattern, graded pressure and coordination to control the serum fluid from vials to serum plates.  Pt's work requires high precision with hand function.   With attempts to use pipet, his right thumb demonstrates decreased proprioception especially when patient is not visually attending directly to the pipet, also has difficulty with modulation of graded pressure to operate the 2 levels of thumb flexion required.  Thumb slipping off the edge of the top of the pipet  repeatedly.    Additional focus on right hand 3 point prehension patterns today to pick up some of the larger vials and items.  Pt continues to demo difficulty with incorporation of middle finger into grasping and prehension patterns.    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  He continues to demonstrate decreased sensation, decreased graded pressure responses and ability to modulate pressure, decreased proprioception and often requires compensatory techniques such as vision to accommodate.  Pt remains highly motivated and is highly engaged at therapy and at home with HEP.  Will continue to work towards improving right hand function, continue to reinforce normal movement patterns, safety with tasks and calibrating movements towards more precise patterns.                     OT Education - 04/28/20 [phone]    Education Details RUE functional use, work related tasks, Surveyor, mining) Educated Patient    Methods Explanation;Demonstration    Comprehension Verbalized understanding;Returned demonstration               OT Long Term Goals - 04/16/20 2040      OT LONG TERM GOAL #1   Title Pt will be independent with home exercise program    Baseline no program at eval    Time 12    Period Weeks    Status New    Target Date 07/05/20      OT LONG TERM GOAL #2   Title Pt will complete dressing skills, UE and LE with modified independence    Baseline difficulty with socks and shoes at times, unable to tie laces    Time 6    Period Weeks    Status New    Target Date 05/24/20      OT LONG TERM GOAL #3   Title Pt will improve right grip strength by 15# to assist with cutting meat with modified independence.    Baseline Eval unable    Time 12    Period Weeks    Status New    Target Date 07/06/18      OT LONG TERM GOAL #4   Title Pt  will improve right hand coordination to brush teeth without difficulty.    Baseline Eval:has to use left hand currently    Time 6    Period Weeks    Status New    Target Date 05/24/20      OT LONG TERM GOAL #5   Title Patient will improve typing speed greater than 12 WPM to answer emails and perform work tasks.    Baseline Eval:  adjust WPM 4.    Time 12    Period Weeks  Status New    Target Date 07/05/20      Long Term Additional Goals   Additional Long Term Goals Yes      OT LONG TERM GOAL #6   Title Patient will improve strength in right hand to pick up 3 yo daughter from high chair.    Baseline unable at eval    Time 12    Period Weeks    Status New    Target Date 07/05/20      OT LONG TERM GOAL #7   Title Patient will improve hand function to write and sign his name on important papers with legibilty greater than 50%    Baseline Eval: 25%, poor legibility    Time 12    Period Weeks    Status New    Target Date 07/05/20      OT LONG TERM GOAL #8   Title Pt will improve FOTO score to 65 or greater to show clinically relevant change to engage in ADL and IADL tasks with greater independence.    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  He continues to demonstrate decreased sensation, decreased graded pressure responses and ability to modulate pressure, decreased proprioception and often requires compensatory techniques such as vision to accommodate.  Pt remains highly motivated and is highly engaged at therapy and at home with HEP.  Will continue to work towards improving right hand function, continue to reinforce normal movement patterns, safety with tasks and calibrating movements towards  more precise patterns.    OT Occupational Profile and History Detailed Assessment- Review of Records and additional review of physical, cognitive, psychosocial history related to current functional performance    Occupational performance deficits (Please refer to evaluation for details): ADL's;IADL's;Work;Leisure;Social Participation    Body Structure / Function / Physical Skills ADL;Dexterity;Strength;Balance;Coordination;FMC;IADL;Endurance;Sensation;UE functional use;Proprioception    Psychosocial Skills Environmental  Adaptations;Habits;Routines and Behaviors    Rehab Potential Good    Clinical Decision Making Several treatment options, min-mod task modification necessary    Comorbidities Affecting Occupational Performance: May have comorbidities impacting occupational performance    Modification or Assistance to Complete Evaluation  No modification of tasks or assist necessary to complete eval    OT Frequency 2x / week    OT Duration 12 weeks    OT Treatment/Interventions Self-care/ADL training;Cryotherapy;Therapeutic exercise;DME and/or AE instruction;Balance training;Neuromuscular education;Manual Therapy;Moist Heat;Contrast Bath;Therapeutic activities;Patient/family education    Consulted and Agree with Plan of Care Patient           Patient will benefit from skilled therapeutic intervention in order to improve the following deficits and impairments:   Body Structure / Function / Physical Skills: ADL,Dexterity,Strength,Balance,Coordination,FMC,IADL,Endurance,Sensation,UE functional use,Proprioception   Psychosocial Skills: Environmental  Adaptations,Habits,Routines and Behaviors   Visit Diagnosis: Muscle weakness (generalized)  Other lack of coordination  Hemiparesis affecting dominant side as late effect of stroke ([facility])  Left middle cerebral artery stroke [facility])    Problem List Patient Active Problem List   Diagnosis Date Noted  . Transaminitis   . Hemiparesis affecting  dominant side as late effect of stroke ([facility])   .  Blood pressure increase diastolic   . Left middle cerebral artery stroke ([facility]) 03/23/2020  . Urinary retention   . Dyslipidemia   . Benign essential HTN   . Acute ischemic stroke ([facility])   . Global aphasia   . Depression   . Hyperglycemia   . Right hemiparesis ([facility])   . Aphasia   . Elevated blood pressure reading   . Stroke (cerebrum) ([facility]) 03/17/2020  . Enthesopathy of hip region on both sides 01/26/2019  . Chronic right shoulder pain 12/10/2014  . Scapular dyskinesis 07/29/2014  . Bursitis of right shoulder 03/01/2014   Maragret Vanacker T Arne Cleveland, OTR/L, CLT  [place] 04/28/2020, 8:40 AM  [place] [facility] MAIN [facility] SERVICES [address] [place], [place], [address] Phone: [phone]   Fax:  [phone]  Name: Cody Henson MRN: [id number] Date of Birth: 1975/08/08

## 2020-04-26 NOTE — Therapy (Signed)
[place] MAIN [facility] SERVICES [address] [place], [place], [address] Phone: [phone]   Fax:  [phone]  Physical Therapy Treatment  Patient Details  Name: Cody Henson MRN: [id number] Date of Birth: Jun 17, 1975 No data recorded  Encounter Date: 04/26/2020   PT End of Session - 04/26/20 1257    Visit Number 5    Number of Visits 25    Date for PT Re-Evaluation 07/05/20    PT Start Time 0915    PT Stop Time 0958    PT Time Calculation (min) 43 min    Equipment Utilized During Treatment Gait belt    Activity Tolerance Patient tolerated treatment well    Behavior During Therapy WFL for tasks assessed/performed           Past Medical History:  Diagnosis Date  . Allergy    Phreesia 04/10/2020  . Depressed   . Hyperlipidemia    Phreesia 04/10/2020  . Hypertension    Phreesia 04/10/2020  . Neuromuscular disorder ([place])    Phreesia 04/10/2020  . Stroke [facility])    Phreesia 04/10/2020    Past Surgical History:  Procedure Laterality Date  . BUBBLE STUDY  03/21/2020   Procedure: BUBBLE STUDY;  Surgeon: [name], MD;  Location: [place];  Service: Cardiology;;  . TEE WITHOUT CARDIOVERSION N/A 03/21/2020   Procedure: TRANSESOPHAGEAL ECHOCARDIOGRAM (TEE);  Surgeon: [name], MD;  Location: [place];  Service: Cardiology;  Laterality: N/A;    There were no vitals filed for this visit.   Subjective Assessment - 04/26/20 1255    Subjective The patient states he is doing well a couple minor LOB at home but was able to catch self.Marland Kitchen    Pertinent History HTN    Limitations Walking;Standing    How long can you sit comfortably? unrestricted    How long can you stand comfortably? 15 mins    How long can you walk comfortably? 1 hour no AD    Patient Stated Goals be able to drive at type    Currently in Pain? No/denies           Nu Step L3 68mins SPM >70 (min VC to maintain > 70 SPM)   NEUROMUSCULAR  RE-EDUCATION  Airex pad tandem balance with narrowed stance with eyes open x 30 sec x 2 reps and eyes closed x 30 sec x 2 sets- CGA Airex pad SLS with eyes closed x 30 sec x 2 sets  Kore Balance Static stand with eyes open and no UE support:  Scores= 236, 207. 190 Dynamic: scores1386, 890,1067  THERAPEUTIC EXERCISE  Step up to 1st step x 5 reps BLE (3# ankle weight)  Step up to 2nd step x 5 reps BLE  (3# ankle weight) Hurdle step overs with 3# ankle weight forward/backward x 8 laps no UE support Hurdle side stepping BLE left to right x 15 reps Mini lunge onto bosu ball using 3# x 10 reps each.   Pt educated throughout session about proper posture and technique with exercises. Improved exercise technique, movement at target joints, use of target muscles after min to mod verbal, visual, tactile cues.                                       PT Education - 04/26/20 1256    Education Details balance and exercise technique.    Person(s) Educated  Patient    Methods Explanation;Demonstration;Tactile cues;Verbal cues    Comprehension Verbalized understanding;Returned demonstration;Verbal cues required;Need further instruction;Tactile cues required            PT Short Term Goals - 04/12/20 1310      PT SHORT TERM GOAL #1   Title Patient will be independent in home exercise program to improve strength/mobility for better functional independence with ADLs.    Status New    Target Date 07/05/20             PT Long Term Goals - 04/12/20 1311      PT LONG TERM GOAL #1   Title Patient will increase FOTO score to equal to or greater than 72% to demonstrate statistically significant improvement in mobility and quality of life.    Baseline 04/12/20 56%    Status New    Target Date 07/05/20      PT LONG TERM GOAL #2   Title Patient will increase Functional Gait Assessment score to >26/30 as to reduce fall risk and improve dynamic gait safety with  community ambulation.    Baseline 04/12/20 FGA 22/30    Status New    Target Date 07/05/20      PT LONG TERM GOAL #3   Title Patient will be able to navigate full flight of stairs independently with reciprocal pattern  to get to next level of home.    Baseline 04/12/20  step to pattern bilateral UE support    Status New    Target Date 07/05/20      PT LONG TERM GOAL #4   Status --    Target Date --                 Plan - 04/26/20 1259    Clinical Impression Statement Patient performed well with balance activities without any loss of balance and responsive to cues for technique. He was able to improve his scores on both the static and dynamic balance training using the KORE balance system today as well as progress with increased resistance. The patient continues to benefit from additional skilled PT services to improve LE strength and balance for improved functional capcity.    Personal Factors and Comorbidities Comorbidity 2    Comorbidities HTN, depression    Examination-Activity Limitations Locomotion Level;Stairs    Examination-Participation Restrictions Yard Work;Driving;Community Activity    Stability/Clinical Decision Making Evolving/Moderate complexity    Rehab Potential Good    PT Frequency 2x / week    PT Duration 12 weeks    PT Treatment/Interventions ADLs/Self Care Home Management;Gait training;Stair training;Functional mobility training;Therapeutic activities;Therapeutic exercise;Balance training;Neuromuscular re-education;Patient/family education;Passive range of motion    PT Next Visit Plan initiate strengthening and balance activites    PT Home Exercise Plan reviewed sit to stand for home    Consulted and Agree with Plan of Care Patient           Patient will benefit from skilled therapeutic intervention in order to improve the following deficits and impairments:  Abnormal gait,Decreased balance,Decreased endurance,Decreased mobility,Difficulty  walking,Decreased range of motion,Improper body mechanics,Decreased activity tolerance,Decreased strength,Impaired flexibility,Postural dysfunction  Visit Diagnosis: Muscle weakness (generalized)  Other lack of coordination  Unsteadiness on feet  Abnormality of gait and mobility     Problem List Patient Active Problem List   Diagnosis Date Noted  . Transaminitis   . Hemiparesis affecting dominant side as late effect of stroke ([place])   . Blood pressure increase diastolic   . Left middle cerebral  artery stroke ([place]) 03/23/2020  . Urinary retention   . Dyslipidemia   . Benign essential HTN   . Acute ischemic stroke ([place])   . Global aphasia   . Depression   . Hyperglycemia   . Right hemiparesis ([place])   . Aphasia   . Elevated blood pressure reading   . Stroke (cerebrum) ([place]) 03/17/2020  . Enthesopathy of hip region on both sides 01/26/2019  . Chronic right shoulder pain 12/10/2014  . Scapular dyskinesis 07/29/2014  . Bursitis of right shoulder 03/01/2014    Lewis Moccasin, PT 04/26/2020, 1:31 PM  [place] MAIN [facility] SERVICES [address] [place], [place], [address] Phone: [phone]   Fax:  [phone]  Name: Tel Hevia MRN: [id number] Date of Birth: 1975/04/16

## 2020-04-27 ENCOUNTER — Ambulatory Visit: Payer: BC Managed Care – PPO

## 2020-04-27 ENCOUNTER — Ambulatory Visit: Payer: BC Managed Care – PPO | Admitting: Speech Pathology

## 2020-04-27 DIAGNOSIS — R2681 Unsteadiness on feet: Secondary | ICD-10-CM | POA: Diagnosis not present

## 2020-04-27 DIAGNOSIS — R269 Unspecified abnormalities of gait and mobility: Secondary | ICD-10-CM | POA: Diagnosis not present

## 2020-04-27 DIAGNOSIS — R4701 Aphasia: Secondary | ICD-10-CM | POA: Diagnosis not present

## 2020-04-27 DIAGNOSIS — R482 Apraxia: Secondary | ICD-10-CM | POA: Diagnosis not present

## 2020-04-27 DIAGNOSIS — I69359 Hemiplegia and hemiparesis following cerebral infarction affecting unspecified side: Secondary | ICD-10-CM | POA: Diagnosis not present

## 2020-04-27 DIAGNOSIS — I63512 Cerebral infarction due to unspecified occlusion or stenosis of left middle cerebral artery: Secondary | ICD-10-CM | POA: Diagnosis not present

## 2020-04-27 DIAGNOSIS — M6281 Muscle weakness (generalized): Secondary | ICD-10-CM

## 2020-04-27 DIAGNOSIS — R278 Other lack of coordination: Secondary | ICD-10-CM | POA: Diagnosis not present

## 2020-04-27 NOTE — Therapy (Signed)
[place] MAIN [facility] SERVICES [address] [place], [place], [address] Phone: [phone]   Fax:  [phone]  Speech Language Pathology Treatment  Patient Details  Name: Cody Henson MRN: [id number] Date of Birth: [birth year] Referring Provider (SLP): Reesa Chew, [place]   Encounter Date: 04/26/2020   End of Session - 04/27/20 1317    Visit Number 5    Number of Visits 25    Date for SLP Re-Evaluation 07/05/20    Authorization Type [employer]    Authorization Time Period 04/12/2020 thru 07/05/2020    Authorization - Visit Number 5    Progress Note Due on Visit 10    SLP Start Time 1000    SLP Stop Time  1045    SLP Time Calculation (min) 45 min    Activity Tolerance Patient tolerated treatment well           Past Medical History:  Diagnosis Date  . Allergy    Phreesia 04/10/2020  . Depressed   . Hyperlipidemia    Phreesia 04/10/2020  . Hypertension    Phreesia 04/10/2020  . Neuromuscular disorder ([place])    Phreesia 04/10/2020  . Stroke [facility])    Phreesia 04/10/2020    Past Surgical History:  Procedure Laterality Date  . BUBBLE STUDY  03/21/2020   Procedure: BUBBLE STUDY;  Surgeon: [name], MD;  Location: [place];  Service: Cardiology;;  . TEE WITHOUT CARDIOVERSION N/A 03/21/2020   Procedure: TRANSESOPHAGEAL ECHOCARDIOGRAM (TEE);  Surgeon: [name], MD;  Location: [place];  Service: Cardiology;  Laterality: N/A;    There were no vitals filed for this visit.   Subjective Assessment - 04/27/20 1315    Subjective pt pleasant, talkative, reports being fatigued after OT and PT sessions    Currently in Pain? No/denies            Neuro   ST   TX   NOTE          Treatment Data and Patient's Response to Treatment   Skilled treatment session focused on pt's language organization. Pt arrived to session fatigued after completing OT and PT sessions prior to ST. 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 In addition to the motoric mechanics of composing an email, he reports difficult with organizing information within paragraphs as well as organizing sentences. Pt identified several types of emails that he would like to compose to update on his stroke: friends and family who know that he had a stroke, friends and family who don't know that he had a stroke and friends that are scientists. SLP facilitated organization of language by creating an outline including: greeting, personal update about his wife's pregnancy and their young daughter and an introduction paragraph on the date and initial hospitalization for his stroke. Pt will complete each paragraph for homework and email to me.   Pt is making progress progress as demonstrated by his desire to communicate thru emails again. However, pt continues to require skilled ST intervention to increase pt's organization of language and formulation of sentences.         SLP Education - 04/27/20 1316    Education Details language organization when conveying written information    Person(s) Educated Patient    Methods Explanation;Demonstration;Handout    Comprehension Verbalized understanding;Returned demonstration;Need further instruction            SLP Short Term Goals - 04/12/20 1710  SLP SHORT TERM GOAL #1   Title Pt will utilize speech intelligibility strategies to produce speech intelligibility to >95% at the complex sentence level.    Baseline Minimal assistance for basic sentences    Time 10    Period --   sessions   Status New      SLP SHORT TERM GOAL #2   Title With moderate cues, pt will utilize word finding strategies during a description task to effective produce 3 descriptive sentences with >75%.    Baseline expressive langauge contains lots of fillers    Time 10    Period --   sessions   Status New      SLP SHORT TERM GOAL #3   Title With moderate cues, pt will increase reading  comprehension by answering [facility] questions after reading semi-complex information with 50% accuracy.    Baseline maximal for work related scientific papers    Time 10    Period --   sessions   Status New      SLP SHORT TERM GOAL #4   Title With minimal cues, pt will correctly spell functional phrases with > 90% accuracy.    Baseline unable to spell greetings for email responses    Time 10    Period --   sessions   Status New            SLP Long Term Goals - 04/12/20 1719      SLP LONG TERM GOAL #1   Title Pt will utilize word finding strategies to communicate/convey abstract complex information independently.    Baseline Moderate Assistance    Time 12    Period Weeks    Status New    Target Date 07/05/20      SLP LONG TERM GOAL #2   Title Pt will use speech intelligibility strategies to achieve >95% intelligibility when conveying complex abstract information.    Baseline moderate assistance    Time 12    Period Weeks    Status New    Target Date 07/05/20      SLP LONG TERM GOAL #3   Title Pt will read complex material with > 75% comprehension.    Baseline short simple paragraph level    Time 12    Period Weeks    Status New    Target Date 07/05/20      SLP LONG TERM GOAL #4   Title Pt will compose a semi-complex email with < 25% spelling errors.    Baseline unable to spell basic greetings    Time 12    Period Weeks    Status New    Target Date 07/05/20            Plan - 04/27/20 1318    Speech Therapy Frequency 1x /week    Duration 12 weeks    Treatment/Interventions Language facilitation;Compensatory techniques;SLP instruction and feedback;Patient/family education    Potential to Achieve Goals Good           Patient will benefit from skilled therapeutic intervention in order to improve the following deficits and impairments:   Aphasia  Left middle cerebral artery stroke [facility])    Problem List Patient Active Problem List   Diagnosis Date Noted  .  Transaminitis   . Hemiparesis affecting dominant side as late effect of stroke ([place])   . Blood pressure increase diastolic   . Left middle cerebral artery stroke ([place]) 03/23/2020  . Urinary retention   . Dyslipidemia   . Benign  essential HTN   . Acute ischemic stroke ([place])   . Global aphasia   . Depression   . Hyperglycemia   . Right hemiparesis ([place])   . Aphasia   . Elevated blood pressure reading   . Stroke (cerebrum) ([place]) 03/17/2020  . Enthesopathy of hip region on both sides 01/26/2019  . Chronic right shoulder pain 12/10/2014  . Scapular dyskinesis 07/29/2014  . Bursitis of right shoulder 03/01/2014   Ghada Abbett B. Rutherford Nail M.S., CCC-SLP, [place] Pathologist Rehabilitation Services Office [phone]  Stormy Fabian 04/27/2020, 1:19 PM  [place] MAIN [facility] SERVICES [address] [place], [place], [address] Phone: [phone]   Fax:  [phone]   Name: Eeshan Verbrugge MRN: [id number] Date of Birth: [birth year]

## 2020-04-27 NOTE — Patient Instructions (Signed)
Develop paragraphs describing his stay at [facility] acute and CIR for letter to send to friends and family

## 2020-04-27 NOTE — Therapy (Signed)
[place] MAIN [facility] SERVICES [address] [place], [place], [address] Phone: [phone]   Fax:  [phone]  Physical Therapy Treatment  Patient Details   Name: Cody Henson MRN: [id number] Date of Birth: November 15, 1975 No data recorded  Encounter Date: 04/27/2020   PT End of Session - 04/27/20 0901    Visit Number 6    Number of Visits 25    Date for PT Re-Evaluation 07/05/20    PT Start Time 0859    PT Stop Time 0948    PT Time Calculation (min) 49 min    Equipment Utilized During Treatment Gait belt    Activity Tolerance Patient tolerated treatment well    Behavior During Therapy [facility] for tasks assessed/performed           Past Medical History:  Diagnosis Date  . Allergy    Phreesia 04/10/2020  . Depressed   . Hyperlipidemia    Phreesia 04/10/2020  . Hypertension    Phreesia 04/10/2020  . Neuromuscular disorder ([place])    Phreesia 04/10/2020  . Stroke [facility])    Phreesia 04/10/2020    Past Surgical History:  Procedure Laterality Date  . BUBBLE STUDY  03/21/2020   Procedure: BUBBLE STUDY;  Surgeon: [name], MD;  Location: [place];  Service: Cardiology;;  . TEE WITHOUT CARDIOVERSION N/A 03/21/2020   Procedure: TRANSESOPHAGEAL ECHOCARDIOGRAM (TEE);  Surgeon: [name], MD;  Location: [place];  Service: Cardiology;  Laterality: N/A;    There were no vitals filed for this visit.   Subjective Assessment - 04/27/20 0900    Subjective Pt reports soreness from performing yard work. No stumbles or falls.    Pertinent History HTN    Limitations Walking;Standing    How long can you sit comfortably? unrestricted    How long can you stand comfortably? 15 mins    How long can you walk comfortably? 1 hour no AD    Patient Stated Goals be able to drive at type    Currently in Pain? No/denies    Pain Score 0-No pain           Nu-Step L5 for 5 min. SPM > 70  (Not billed)    There.ex:    Resisted walking 4  way at Matrix machine: 22.5 lbs, x8 forward/backward/R/L CGA+1, with verbal cuing required for squatting to lower COM. Intermittent LOB and lat sway but pt able to correct independently.   Alternating lunges onto bosu ball. 1x12 with LLE and 2x6 on RLE due to weakness in RLE > LLE. Pt fatigued easily with this exercise and demonstrates increased R lat sway when RLE on bosu with lunge. Intermittent need for RUE support on // bars with RLE on bosu to correct balance. CGA+1   Neuro Re-Ed:    Airex beam    Tandem walking forwards backwards. x30 sec with dual tasking counting multiples of 5's. Progressed to multiples of 7's. CGA+1. Decreased in walking speed and increased time to perform with added dual task but no LOB or sway.    Lateral marches: x10/direction. Intermittent lat swaybut no LOB. CGA+1   SLS alternating LE's: 2x30 sec. Intermittent need for finger touch support. CGA+1    Pt educated on at home balance exercises to perform at home with use of counter top or corner of room at home for safety purposes. Educated on future POC with focus on challenging vestibular system due to pt demonstrating most balance difficulties with challenging this sytem.  PT Education - 04/27/20 0900    Education Details Form/technique with exercise.    Person(s) Educated Patient    Methods Explanation;Demonstration;Tactile cues;Verbal cues    Comprehension Verbalized understanding;Returned demonstration;Verbal cues required;Tactile cues required            PT Short Term Goals - 04/12/20 1310      PT SHORT TERM GOAL #1   Title Patient will be independent in home exercise program to improve strength/mobility for better functional independence with ADLs.    Status New    Target Date 07/05/20             PT Long Term Goals - 04/12/20 1311      PT LONG TERM GOAL #1   Title Patient will increase FOTO score to equal to or greater than 72% to demonstrate statistically significant improvement in mobility  and quality of life.    Baseline 04/12/20 56%    Status New    Target Date 07/05/20      PT LONG TERM GOAL #2   Title Patient will increase Functional Gait Assessment score to >26/30 as to reduce fall risk and improve dynamic gait safety with community ambulation.    Baseline 04/12/20 FGA 22/30    Status New    Target Date 07/05/20      PT LONG TERM GOAL #3   Title Patient will be able to navigate full flight of stairs independently with reciprocal pattern  to get to next level of home.    Baseline 04/12/20  step to pattern bilateral UE support    Status New    Target Date 07/05/20      PT LONG TERM GOAL #4   Status --    Target Date --                 Plan - 04/27/20 7169    Clinical Impression Statement Pt progressed to dynamic balance exercises with use of resisted walking for added perturbations with different planes of motion. Intermittent sway and LOB that pt was able to correct independently. Pt demonstrated decreased speed of balance exercises when adding dual task component but no sway or LOB. Most challenge to balance noted with unstable surfaces and perturbations with a narrow BOS that place pt off balance indicating most difficulty with challenge to the vestibular system. Pt can continue to benefit from further skilled PT treatment to improve balance to return to PLOF.    Personal Factors and Comorbidities Comorbidity 2    Comorbidities HTN, depression    Examination-Activity Limitations Locomotion Level;Stairs    Examination-Participation Restrictions Yard Work;Driving;Community Activity    Stability/Clinical Decision Making Evolving/Moderate complexity    Rehab Potential Good    PT Frequency 2x / week    PT Duration 12 weeks    PT Treatment/Interventions ADLs/Self Care Home Management;Gait training;Stair training;Functional mobility training;Therapeutic activities;Therapeutic exercise;Balance training;Neuromuscular re-education;Patient/family education;Passive range  of motion    PT Next Visit Plan Print off HEP for pt with balance exercises. Challenge vestibular system.    PT Home Exercise Plan reviewed sit to stand for home    Consulted and Agree with Plan of Care Patient           Patient will benefit from skilled therapeutic intervention in order to improve the following deficits and impairments:  Abnormal gait,Decreased balance,Decreased endurance,Decreased mobility,Difficulty walking,Decreased range of motion,Improper body mechanics,Decreased activity tolerance,Decreased strength,Impaired flexibility,Postural dysfunction  Visit Diagnosis: Muscle weakness (generalized)  Unsteadiness on feet  Abnormality of gait and mobility  Problem List Patient Active Problem List   Diagnosis Date Noted  . Transaminitis   . Hemiparesis affecting dominant side as late effect of stroke ([place])   . Blood pressure increase diastolic   . Left middle cerebral artery stroke ([place]) 03/23/2020  . Urinary retention   . Dyslipidemia   . Benign essential HTN   . Acute ischemic stroke ([place])   . Global aphasia   . Depression   . Hyperglycemia   . Right hemiparesis ([place])   . Aphasia   . Elevated blood pressure reading   . Stroke (cerebrum) ([place]) 03/17/2020  . Enthesopathy of hip region on both sides 01/26/2019  . Chronic right shoulder pain 12/10/2014  . Scapular dyskinesis 07/29/2014  . Bursitis of right shoulder 03/01/2014    Salem Caster. Fairly IV, PT, DPT Physical Therapist- [facility]  04/27/2020, 10:04 AM  [place] MAIN [facility] SERVICES [address] [place], [place], [address] Phone: [phone]   Fax:  [phone]  Name: Cody Henson MRN: [id number] Date of Birth: 05-03-1975

## 2020-04-28 ENCOUNTER — Other Ambulatory Visit: Payer: Self-pay

## 2020-04-28 ENCOUNTER — Encounter: Payer: Self-pay | Admitting: Occupational Therapy

## 2020-04-28 ENCOUNTER — Ambulatory Visit: Payer: BC Managed Care – PPO | Admitting: Occupational Therapy

## 2020-04-28 DIAGNOSIS — I69359 Hemiplegia and hemiparesis following cerebral infarction affecting unspecified side: Secondary | ICD-10-CM | POA: Diagnosis not present

## 2020-04-28 DIAGNOSIS — R2681 Unsteadiness on feet: Secondary | ICD-10-CM | POA: Diagnosis not present

## 2020-04-28 DIAGNOSIS — R278 Other lack of coordination: Secondary | ICD-10-CM | POA: Diagnosis not present

## 2020-04-28 DIAGNOSIS — M6281 Muscle weakness (generalized): Secondary | ICD-10-CM | POA: Diagnosis not present

## 2020-04-28 DIAGNOSIS — R4701 Aphasia: Secondary | ICD-10-CM | POA: Diagnosis not present

## 2020-04-28 DIAGNOSIS — R482 Apraxia: Secondary | ICD-10-CM | POA: Diagnosis not present

## 2020-04-28 DIAGNOSIS — I63512 Cerebral infarction due to unspecified occlusion or stenosis of left middle cerebral artery: Secondary | ICD-10-CM | POA: Diagnosis not present

## 2020-04-28 DIAGNOSIS — R269 Unspecified abnormalities of gait and mobility: Secondary | ICD-10-CM | POA: Diagnosis not present

## 2020-04-28 NOTE — Therapy (Signed)
[place] MAIN [facility] SERVICES [address] [place], [place], [address] Phone: [phone]   Fax:  [phone]  Occupational Therapy Treatment  Patient Details  Name: Cody Henson MRN: [id number] Date of Birth: 1975/10/07 No data recorded  Encounter Date: 04/28/2020   OT End of Session - 04/28/20 1721    Visit Number 6    Number of Visits 24    Date for OT Re-Evaluation 07/05/20    OT Start Time 0915    OT Stop Time 1000    OT Time Calculation (min) 45 min    Activity Tolerance Patient tolerated treatment well    Behavior During Therapy [facility] for tasks assessed/performed           Past Medical History:  Diagnosis Date  . Allergy    Phreesia 04/10/2020  . Depressed   . Hyperlipidemia    Phreesia 04/10/2020  . Hypertension    Phreesia 04/10/2020  . Neuromuscular disorder ([place])    Phreesia 04/10/2020  . Stroke [facility])    Phreesia 04/10/2020    Past Surgical History:  Procedure Laterality Date  . BUBBLE STUDY  03/21/2020   Procedure: BUBBLE STUDY;  Surgeon: [name], MD;  Location: [place];  Service: Cardiology;;  . TEE WITHOUT CARDIOVERSION N/A 03/21/2020   Procedure: TRANSESOPHAGEAL ECHOCARDIOGRAM (TEE);  Surgeon: [name], MD;  Location: [place];  Service: Cardiology;  Laterality: N/A;    There were no vitals filed for this visit.   Subjective Assessment - 04/28/20 1720    Subjective  Pt. reports that he took down the [place] this week, and used hedge clippers with his BUEs.    Pertinent History Per medial records, Cody Henson is a 45 y.o. male with history of depression who was admitted on 03/16/20 with right sided weakness, facial weakness and aphasia. CT head showed evolving hypodensity in mid/posterior left frontal lobe and CTA was negative for LVO. UDS showed THC. He received tPA and follow up MRI showed patchy multifocal acute ischemic infarcts in left cerebral hemisphere without hemorrhagic  transformation.    Patient Stated Goals Pt would like to be as independent as possible, wants to drive and return to work.    Currently in Pain? No/denies          OT TREATMENT    Neuro muscular re-education:  Pt. worked on right hand motor control skills. Pt. attempted to grasp 1" grooved pegs, turn them in his fingers, and place them into the grooved slot. Pt. had difficulty grasping the pegs, and was unable to turn them into position with his fingers. The task was then modified to focus on grasping 1" cubes using a 3pt. Pinch, and a tip to tip grasp pattern with emphasis placed on moving the 3rd digit into position on a resistive Board placed at both a vertical angle, as well as  Flat at the tabletop. Pt. worked on isolating his 3rd digit to press them into place.  Therapeutic Exercise:  Pt. worked on [place] for 8 min. while in standing. Constant monitoring of the BUEs was provided.  Pt. reports that he, and his father worked together out in the yard this week. Pt. reports using clippers with his BUEs. Pt. has been engaging his right hand more during tasks at home, and has been trying to work on work related [facility] tasks. Pt. prepsents with impaired motor control, and required verbal cues, and cues for visual demonstration to formulate 3pt.  Pinch, and move the 3rd digit into position on the object. Pt. continues to benefit from skilled OT services to work towards improving Right hand motor control,and functioning to maximize independence with ADLs, and IADLs.                          OT Education - 04/28/20 1721    Education Details RUE functional use, work related tasks, Psychiatric nurse) Educated Patient    Methods Explanation;Demonstration    Comprehension Verbalized understanding;Returned demonstration               OT Long Term Goals - 04/16/20 2040      OT LONG TERM GOAL #1   Title Pt will be independent with home exercise  program    Baseline no program at eval    Time 12    Period Weeks    Status New    Target Date 07/05/20      OT LONG TERM GOAL #2   Title Pt will complete dressing skills, UE and LE with modified independence    Baseline difficulty with socks and shoes at times, unable to tie laces    Time 6    Period Weeks    Status New    Target Date 05/24/20      OT LONG TERM GOAL #3   Title Pt will improve right grip strength by 15# to assist with cutting meat with modified independence.    Baseline Eval unable    Time 12    Period Weeks    Status New    Target Date 07/06/18      OT LONG TERM GOAL #4   Title Pt will improve right hand coordination to brush teeth without difficulty.    Baseline Eval:has to use left hand currently    Time 6    Period Weeks    Status New    Target Date 05/24/20      OT LONG TERM GOAL #5   Title Patient will improve typing speed greater than 12 WPM to answer emails and perform work tasks.    Baseline Eval:  adjust WPM 4.    Time 12    Period Weeks    Status New    Target Date 07/05/20      Long Term Additional Goals   Additional Long Term Goals Yes      OT LONG TERM GOAL #6   Title Patient will improve strength in right hand to pick up 42 yo daughter from high chair.    Baseline unable at eval    Time 12    Period Weeks    Status New    Target Date 07/05/20      OT LONG TERM GOAL #7   Title Patient will improve hand function to write and sign his name on important papers with legibilty greater than 50%    Baseline Eval: 25%, poor legibility    Time 12    Period Weeks    Status New    Target Date 07/05/20      OT LONG TERM GOAL #8   Title Pt will improve FOTO score to 65 or greater to show clinically relevant change to engage in ADL and IADL tasks with greater independence.    Baseline Eval 51    Time 12    Period Weeks    Status New    Target Date 07/05/20  Plan - 04/28/20 1723    Clinical Impression Statement  Pt. reports that he, and his father worked together out in the yard this week. Pt. reports using clippers with his BUEs. Pt. has been engaging his right hand more during tasks at home, and has been trying to work on work related [facility] tasks. Pt. prepsents with impaired motor control, and required verbal cues, and cues for visual demonstration to formulate 3pt. Pinch, and move the 3rd digit into position on the object. Pt. continues to benefit from skilled OT services to work towards improving Right hand motor control,and functioning to maximize independence with ADLs, and IADLs.    OT Occupational Profile and History Detailed Assessment- Review of Records and additional review of physical, cognitive, psychosocial history related to current functional performance    Occupational performance deficits (Please refer to evaluation for details): ADL's;IADL's;Work;Leisure;Social Participation    Body Structure / Function / Physical Skills ADL;Dexterity;Strength;Balance;Coordination;FMC;IADL;Endurance;Sensation;UE functional use;Proprioception    Psychosocial Skills Environmental  Adaptations;Habits;Routines and Behaviors    Rehab Potential Good    Clinical Decision Making Several treatment options, min-mod task modification necessary    Comorbidities Affecting Occupational Performance: May have comorbidities impacting occupational performance    Modification or Assistance to Complete Evaluation  No modification of tasks or assist necessary to complete eval    OT Frequency 2x / week    OT Duration 12 weeks    OT Treatment/Interventions Self-care/ADL training;Cryotherapy;Therapeutic exercise;DME and/or AE instruction;Balance training;Neuromuscular education;Manual Therapy;Moist Heat;Contrast Bath;Therapeutic activities;Patient/family education    Consulted and Agree with Plan of Care Patient           Patient will benefit from skilled therapeutic intervention in order to improve the following deficits and  impairments:   Body Structure / Function / Physical Skills: ADL,Dexterity,Strength,Balance,Coordination,FMC,IADL,Endurance,Sensation,UE functional use,Proprioception   Psychosocial Skills: Environmental  Adaptations,Habits,Routines and Behaviors   Visit Diagnosis: Muscle weakness (generalized)  Other lack of coordination    Problem List Patient Active Problem List   Diagnosis Date Noted  . Transaminitis   . Hemiparesis affecting dominant side as late effect of stroke ([place])   . Blood pressure increase diastolic   . Left middle cerebral artery stroke ([place]) 03/23/2020  . Urinary retention   . Dyslipidemia   . Benign essential HTN   . Acute ischemic stroke ([place])   . Global aphasia   . Depression   . Hyperglycemia   . Right hemiparesis ([place])   . Aphasia   . Elevated blood pressure reading   . Stroke (cerebrum) ([place]) 03/17/2020  . Enthesopathy of hip region on both sides 01/26/2019  . Chronic right shoulder pain 12/10/2014  . Scapular dyskinesis 07/29/2014  . Bursitis of right shoulder 03/01/2014    Harrel Carina, MS, OTR/L 04/28/2020, 5:26 PM  [place] MAIN [facility] SERVICES [address] [place], [place], [address] Phone: [phone]   Fax:  [phone]  Name: Cody Henson MRN: [id number] Date of Birth: 07/12/1975

## 2020-05-02 ENCOUNTER — Ambulatory Visit: Payer: BC Managed Care – PPO | Admitting: Speech Pathology

## 2020-05-02 ENCOUNTER — Ambulatory Visit: Payer: BC Managed Care – PPO

## 2020-05-02 ENCOUNTER — Ambulatory Visit: Payer: BC Managed Care – PPO | Admitting: Occupational Therapy

## 2020-05-02 ENCOUNTER — Other Ambulatory Visit: Payer: Self-pay

## 2020-05-02 ENCOUNTER — Encounter: Payer: Self-pay | Admitting: Occupational Therapy

## 2020-05-02 DIAGNOSIS — R2681 Unsteadiness on feet: Secondary | ICD-10-CM | POA: Diagnosis not present

## 2020-05-02 DIAGNOSIS — R278 Other lack of coordination: Secondary | ICD-10-CM

## 2020-05-02 DIAGNOSIS — I63512 Cerebral infarction due to unspecified occlusion or stenosis of left middle cerebral artery: Secondary | ICD-10-CM | POA: Diagnosis not present

## 2020-05-02 DIAGNOSIS — R482 Apraxia: Secondary | ICD-10-CM

## 2020-05-02 DIAGNOSIS — R269 Unspecified abnormalities of gait and mobility: Secondary | ICD-10-CM

## 2020-05-02 DIAGNOSIS — R4701 Aphasia: Secondary | ICD-10-CM

## 2020-05-02 DIAGNOSIS — M6281 Muscle weakness (generalized): Secondary | ICD-10-CM

## 2020-05-02 DIAGNOSIS — I69359 Hemiplegia and hemiparesis following cerebral infarction affecting unspecified side: Secondary | ICD-10-CM | POA: Diagnosis not present

## 2020-05-02 NOTE — Therapy (Signed)
[place] MAIN [facility] SERVICES [address] [place], [place], [address] Phone: [phone]   Fax:  [phone]  Physical Therapy Treatment  Patient Details  Name: Cody Henson MRN: [id number] Date of Birth: 1976/02/08 No data recorded  Encounter Date: 05/02/2020   PT End of Session - 05/02/20 0808    Visit Number 7    Number of Visits 25    Date for PT Re-Evaluation 07/05/20    PT Start Time 0804    PT Stop Time 0844    PT Time Calculation (min) 40 min    Equipment Utilized During Treatment Gait belt    Activity Tolerance Patient tolerated treatment well    Behavior During Therapy [facility] for tasks assessed/performed           Past Medical History:  Diagnosis Date  . Allergy    Phreesia 04/10/2020  . Depressed   . Hyperlipidemia    Phreesia 04/10/2020  . Hypertension    Phreesia 04/10/2020  . Neuromuscular disorder ([place])    Phreesia 04/10/2020  . Stroke [facility])    Phreesia 04/10/2020    Past Surgical History:  Procedure Laterality Date  . BUBBLE STUDY  03/21/2020   Procedure: BUBBLE STUDY;  Surgeon: [name], MD;  Location: [place];  Service: Cardiology;;  . TEE WITHOUT CARDIOVERSION N/A 03/21/2020   Procedure: TRANSESOPHAGEAL ECHOCARDIOGRAM (TEE);  Surgeon: [name], MD;  Location: [place];  Service: Cardiology;  Laterality: N/A;    There were no vitals filed for this visit.   Subjective Assessment - 05/02/20 0806    Subjective Pt reports having a busy weekend -went to [place] for family event and went to [facility]. Reports was overwhelmed.    Pertinent History HTN    Limitations Walking;Standing    How long can you sit comfortably? unrestricted    How long can you stand comfortably? 15 mins    How long can you walk comfortably? 1 hour no AD    Patient Stated Goals be able to drive at type    Currently in Pain? No/denies               Treatment:   Mini Lunge at // bars with back leg on  green theraband pad and front leg onto Bosu ball- ball side up - 2sets of 10 reps. Mild unsteadiness - more when leading with right LE. No UE support   Side step w squat on blue balance beam - 2 squats per length of beam left to right then back for 5 trials- total of 20 squats- mild unsteadiness yet no UE support. Patient improved overall with practice.    Resisted walking 3 way at Matrix machine: 22.5 lbs-27.5 lb, x5 forward/backward/right CGA+1, mild intermittent unsteadiness- usually when starting new movement but improved quickly.    Airex blue pad - SLS while performing basketball shot in //bars - 5 nerf ball shots per side- then Patient walked in gym squatting to pick up balls to place back into basket.  Patient performed two trials- 20 shots- mild unsteadiness yet no loss of balance while performing task.   Pt educated throughout session about proper posture and technique with exercises. Improved exercise technique, movement at target joints, use of target muscles after min to mod verbal, visual, tactile cues.                              PT Education - 05/02/20  2051    Education Details Balance and specific exercise techniques.    Person(s) Educated Patient    Methods Explanation;Demonstration;Tactile cues;Verbal cues    Comprehension Verbalized understanding;Tactile cues required;Returned demonstration;Need further instruction;Verbal cues required            PT Short Term Goals - 04/12/20 1310      PT SHORT TERM GOAL #1   Title Patient will be independent in home exercise program to improve strength/mobility for better functional independence with ADLs.    Status New    Target Date 07/05/20             PT Long Term Goals - 04/12/20 1311      PT LONG TERM GOAL #1   Title Patient will increase FOTO score to equal to or greater than 72% to demonstrate statistically significant improvement in mobility and quality of life.    Baseline 04/12/20 56%     Status New    Target Date 07/05/20      PT LONG TERM GOAL #2   Title Patient will increase Functional Gait Assessment score to >26/30 as to reduce fall risk and improve dynamic gait safety with community ambulation.    Baseline 04/12/20 FGA 22/30    Status New    Target Date 07/05/20      PT LONG TERM GOAL #3   Title Patient will be able to navigate full flight of stairs independently with reciprocal pattern  to get to next level of home.    Baseline 04/12/20  step to pattern bilateral UE support    Status New    Target Date 07/05/20      PT LONG TERM GOAL #4   Status --    Target Date --                 Plan - 05/02/20 8786    Clinical Impression Statement Pt can continue to benefit from further skilled PT treatment to improve balance to return to PLOF.    Personal Factors and Comorbidities Comorbidity 2    Comorbidities HTN, depression    Examination-Activity Limitations Locomotion Level;Stairs    Examination-Participation Restrictions Yard Work;Driving;Community Activity    Stability/Clinical Decision Making Evolving/Moderate complexity    Rehab Potential Good    PT Frequency 2x / week    PT Duration 12 weeks    PT Treatment/Interventions ADLs/Self Care Home Management;Gait training;Stair training;Functional mobility training;Therapeutic activities;Therapeutic exercise;Balance training;Neuromuscular re-education;Patient/family education;Passive range of motion    PT Next Visit Plan Print off HEP for pt with balance exercises. Challenge vestibular system.    PT Home Exercise Plan reviewed sit to stand for home    Consulted and Agree with Plan of Care Patient           Patient will benefit from skilled therapeutic intervention in order to improve the following deficits and impairments:  Abnormal gait,Decreased balance,Decreased endurance,Decreased mobility,Difficulty walking,Decreased range of motion,Improper body mechanics,Decreased activity tolerance,Decreased  strength,Impaired flexibility,Postural dysfunction  Visit Diagnosis: Other lack of coordination  Muscle weakness (generalized)  Unsteadiness on feet  Abnormality of gait and mobility     Problem List Patient Active Problem List   Diagnosis Date Noted  . Transaminitis   . Hemiparesis affecting dominant side as late effect of stroke ([place])   . Blood pressure increase diastolic   . Left middle cerebral artery stroke ([place]) 03/23/2020  . Urinary retention   . Dyslipidemia   . Benign essential HTN   . Acute ischemic stroke ([place])   .  Global aphasia   . Depression   . Hyperglycemia   . Right hemiparesis ([place])   . Aphasia   . Elevated blood pressure reading   . Stroke (cerebrum) ([place]) 03/17/2020  . Enthesopathy of hip region on both sides 01/26/2019  . Chronic right shoulder pain 12/10/2014  . Scapular dyskinesis 07/29/2014  . Bursitis of right shoulder 03/01/2014    Lewis Moccasin, PT 05/02/2020, 9:18 PM  [place] MAIN [facility] SERVICES [address] [place], [place], [address] Phone: [phone]   Fax:  [phone]  Name: Jair Lindblad MRN: [id number] Date of Birth: 1975/05/09

## 2020-05-02 NOTE — Therapy (Signed)
[place] MAIN [facility] SERVICES [address] [place], [place], [address] Phone: [phone]   Fax:  [phone]  Occupational Therapy Treatment  Patient Details  Name: Cody Henson MRN: [id number] Date of Birth: 06/22/1975 No data recorded  Encounter Date: 05/02/2020   OT End of Session - 05/02/20 1057    Visit Number 7    Number of Visits 24    Date for OT Re-Evaluation 07/05/20    OT Start Time 1001    OT Stop Time 1046    OT Time Calculation (min) 45 min    Activity Tolerance Patient tolerated treatment well    Behavior During Therapy [facility] for tasks assessed/performed           Past Medical History:  Diagnosis Date  . Allergy    Phreesia 04/10/2020  . Depressed   . Hyperlipidemia    Phreesia 04/10/2020  . Hypertension    Phreesia 04/10/2020  . Neuromuscular disorder ([place])    Phreesia 04/10/2020  . Stroke [facility])    Phreesia 04/10/2020    Past Surgical History:  Procedure Laterality Date  . BUBBLE STUDY  03/21/2020   Procedure: BUBBLE STUDY;  Surgeon: [name], MD;  Location: [place];  Service: Cardiology;;  . TEE WITHOUT CARDIOVERSION N/A 03/21/2020   Procedure: TRANSESOPHAGEAL ECHOCARDIOGRAM (TEE);  Surgeon: [name], MD;  Location: [place];  Service: Cardiology;  Laterality: N/A;    There were no vitals filed for this visit.   Subjective Assessment - 05/02/20 1054    Subjective  Pt reports practicing Whitten at home using nuts/bolts, chickpeas for tip to tip pinch, and loading a pipet tray.    Pertinent History Per medial records, Cody Henson is a 45 y.o. male with history of depression who was admitted on 03/16/20 with right sided weakness, facial weakness and aphasia. CT head showed evolving hypodensity in mid/posterior left frontal lobe and CTA was negative for LVO. UDS showed THC. He received tPA and follow up MRI showed patchy multifocal acute ischemic infarcts in left cerebral hemisphere without  hemorrhagic transformation.    Patient Stated Goals Pt would like to be as independent as possible, wants to drive and return to work.    Currently in Pain? No/denies            Neuromuscular Re-education Pt focused on dominant RUE 3pt pinch and tip to tip pinch with manipulating 1 inch ball knob on peg. Pt placed 10 knobs onto peg board using 3 pt, 10 using thumb and 2nd digit, and 5 using thumb and 3rd digit. Pt demonstrated increased difficulty when attempting to incorporate 3rd digit into tasks 2/2 spasticity. Pt unable to grasp knobs out of Tupperware, required setup assist to grasp from upright position. Pt required increased time to complete and cues for WBing rest breaks. Pt showed improved dexterity returning knobs from peg board into Tupperware using 3pt pinch, reports he is less focused on the task as it requires less manipulation.   Pt completed 15 minutes typing exercises on laptop using home row keys and number pad. L+R hand on home row - pt completed 3 sentences of  2-4 letter words with 90% accuracy, 12 WPM. Pt completed R hand only with 94% accuracy and 6 WPM. Pt demonstrated improved accuracy (95%) using Index finger only on number pad. Pt reports he does not type at home on laptop, occasionally uses RUE for texting but largely uses speech to text. Plan to incorporate typing into HEP.  OT Education - 05/02/20 1056    Education Details RUE functional use, HEP schedule    Person(s) Educated Patient    Methods Explanation;Demonstration    Comprehension Verbalized understanding;Returned demonstration               OT Long Term Goals - 04/16/20 2040      OT LONG TERM GOAL #1   Title Pt will be independent with home exercise program    Baseline no program at eval    Time 12    Period Weeks    Status New    Target Date 07/05/20      OT LONG TERM GOAL #2   Title Pt will complete dressing skills, UE and LE with modified independence    Baseline  difficulty with socks and shoes at times, unable to tie laces    Time 6    Period Weeks    Status New    Target Date 05/24/20      OT LONG TERM GOAL #3   Title Pt will improve right grip strength by 15# to assist with cutting meat with modified independence.    Baseline Eval unable    Time 12    Period Weeks    Status New    Target Date 07/06/18      OT LONG TERM GOAL #4   Title Pt will improve right hand coordination to brush teeth without difficulty.    Baseline Eval:has to use left hand currently    Time 6    Period Weeks    Status New    Target Date 05/24/20      OT LONG TERM GOAL #5   Title Patient will improve typing speed greater than 12 WPM to answer emails and perform work tasks.    Baseline Eval:  adjust WPM 4.    Time 12    Period Weeks    Status New    Target Date 07/05/20      Long Term Additional Goals   Additional Long Term Goals Yes      OT LONG TERM GOAL #6   Title Patient will improve strength in right hand to pick up 28 yo daughter from high chair.    Baseline unable at eval    Time 12    Period Weeks    Status New    Target Date 07/05/20      OT LONG TERM GOAL #7   Title Patient will improve hand function to write and sign his name on important papers with legibilty greater than 50%    Baseline Eval: 25%, poor legibility    Time 12    Period Weeks    Status New    Target Date 07/05/20      OT LONG TERM GOAL #8   Title Pt will improve FOTO score to 65 or greater to show clinically relevant change to engage in ADL and IADL tasks with greater independence.    Baseline Eval 51    Time 12    Period Weeks    Status New    Target Date 07/05/20                 Plan - 05/02/20 1057    Clinical Impression Statement Pt reports that he has been engaging his right hand more during tasks at home such as tying shoes and meals. He has been completing [facility] HEP, plans to include more work related tasks. Pt prepsents with impaired motor control  requiring verbal cues, and cues for visual demonstration to formulate 3pt. Pinch, and move the 3rd digit into position on the object. Pt continues to benefit from skilled OT services to work towards improving Right hand motor control,and functioning to maximize independence with ADLs, and IADLs.    OT Occupational Profile and History Detailed Assessment- Review of Records and additional review of physical, cognitive, psychosocial history related to current functional performance    Occupational performance deficits (Please refer to evaluation for details): ADL's;IADL's;Work;Leisure;Social Participation    Body Structure / Function / Physical Skills ADL;Dexterity;Strength;Balance;Coordination;FMC;IADL;Endurance;Sensation;UE functional use;Proprioception    Psychosocial Skills Environmental  Adaptations;Habits;Routines and Behaviors    Rehab Potential Good    Clinical Decision Making Several treatment options, min-mod task modification necessary    Comorbidities Affecting Occupational Performance: May have comorbidities impacting occupational performance    Modification or Assistance to Complete Evaluation  No modification of tasks or assist necessary to complete eval    OT Frequency 2x / week    OT Duration 12 weeks    OT Treatment/Interventions Self-care/ADL training;Cryotherapy;Therapeutic exercise;DME and/or AE instruction;Balance training;Neuromuscular education;Manual Therapy;Moist Heat;Contrast Bath;Therapeutic activities;Patient/family education    Consulted and Agree with Plan of Care Patient           Patient will benefit from skilled therapeutic intervention in order to improve the following deficits and impairments:   Body Structure / Function / Physical Skills: ADL,Dexterity,Strength,Balance,Coordination,FMC,IADL,Endurance,Sensation,UE functional use,Proprioception   Psychosocial Skills: Environmental  Adaptations,Habits,Routines and Behaviors   Visit Diagnosis: Other lack of  coordination  Hemiparesis affecting dominant side as late effect of stroke [facility])    Problem List Patient Active Problem List   Diagnosis Date Noted  . Transaminitis   . Hemiparesis affecting dominant side as late effect of stroke ([place])   . Blood pressure increase diastolic   . Left middle cerebral artery stroke ([place]) 03/23/2020  . Urinary retention   . Dyslipidemia   . Benign essential HTN   . Acute ischemic stroke ([place])   . Global aphasia   . Depression   . Hyperglycemia   . Right hemiparesis ([place])   . Aphasia   . Elevated blood pressure reading   . Stroke (cerebrum) ([place]) 03/17/2020  . Enthesopathy of hip region on both sides 01/26/2019  . Chronic right shoulder pain 12/10/2014  . Scapular dyskinesis 07/29/2014  . Bursitis of right shoulder 03/01/2014   Dessie Coma, M.S. OTR/L  05/02/20, 11:11 AM  ascom [phone]  [place] MAIN [facility] SERVICES [address] [place], [place], [address] Phone: [phone]   Fax:  [phone]  Name: Mareo Portilla MRN: [id number] Date of Birth: 05-22-1975

## 2020-05-03 NOTE — Therapy (Signed)
[place] MAIN [facility] SERVICES [address] [place], [place], [address] Phone: [phone]   Fax:  [phone]  Speech Language Pathology Treatment  Patient Details  Name: Cody Henson MRN: [id number] Date of Birth: 1975/07/25 Referring Provider (SLP): Reesa Chew, [place]   Encounter Date: 05/02/2020   End of Session - 05/03/20 0729    Visit Number 6    Number of Visits 25    Date for SLP Re-Evaluation 07/05/20    Authorization Type [employer]    Authorization Time Period 04/12/2020 thru 07/05/2020    Authorization - Visit Number 6    Progress Note Due on Visit 10    SLP Start Time 0900    SLP Stop Time  1000    SLP Time Calculation (min) 60 min    Activity Tolerance Patient tolerated treatment well           Past Medical History:  Diagnosis Date  . Allergy    Phreesia 04/10/2020  . Depressed   . Hyperlipidemia    Phreesia 04/10/2020  . Hypertension    Phreesia 04/10/2020  . Neuromuscular disorder ([place])    Phreesia 04/10/2020  . Stroke [facility])    Phreesia 04/10/2020    Past Surgical History:  Procedure Laterality Date  . BUBBLE STUDY  03/21/2020   Procedure: BUBBLE STUDY;  Surgeon: [name], MD;  Location: [place];  Service: Cardiology;;  . TEE WITHOUT CARDIOVERSION N/A 03/21/2020   Procedure: TRANSESOPHAGEAL ECHOCARDIOGRAM (TEE);  Surgeon: [name], MD;  Location: [place];  Service: Cardiology;  Laterality: N/A;    There were no vitals filed for this visit.   Subjective Assessment - 05/03/20 0728    Subjective pt pleasant, talkative, reports being fatigued after PT sessions    Currently in Pain? No/denies            Neuro   ST   TX   NOTE          Treatment Data and Patient's Response to Treatment   Skilled treatment session focused on pt's language organization. Pt arrived to session with his laptop. SLP facilitated session by assisting pt with organizing a letter to his family  and friends. Pt used a speech to text program on his laptop. Initially, there were at 50% errors in the dictation d/t pt's rate of speech. With cues to slow rate, pt was able to reduce errors to 0%. Pt's language organization was disjointed and disorganized, rambling. With SLP assistance, pt verbally created each sentences, practice saying the sentence several times and then he dictated the sentence. This strategy increased the cohesiveness of his sentences.  Pt required assistance to generate alternate wording for thoughts to prevent using same word twice in a sentence. This task was very laborous for pt. At the end of the session, pt stated that prior to his CVA he was a poor writter and his ability difficulty within today's was likely baseline. Pt will bring in finished product to next session to better assess baseline abilities.        SLP Education - 05/03/20 [phone]    Education Details language organization when conveying written information    Person(s) Educated Patient    Methods Explanation;Demonstration;Handout;Verbal cues    Comprehension Verbalized understanding;Returned demonstration            SLP Short Term Goals - 04/12/20 1710      SLP SHORT TERM GOAL #1   Title Pt will utilize speech intelligibility strategies  to produce speech intelligibility to >95% at the complex sentence level.    Baseline Minimal assistance for basic sentences    Time 10    Period --   sessions   Status New      SLP SHORT TERM GOAL #2   Title With moderate cues, pt will utilize word finding strategies during a description task to effective produce 3 descriptive sentences with >75%.    Baseline expressive langauge contains lots of fillers    Time 10    Period --   sessions   Status New      SLP SHORT TERM GOAL #3   Title With moderate cues, pt will increase reading comprehension by answering [facility] questions after reading semi-complex information with 50% accuracy.    Baseline maximal for work  related scientific papers    Time 10    Period --   sessions   Status New      SLP SHORT TERM GOAL #4   Title With minimal cues, pt will correctly spell functional phrases with > 90% accuracy.    Baseline unable to spell greetings for email responses    Time 10    Period --   sessions   Status New            SLP Long Term Goals - 04/12/20 1719      SLP LONG TERM GOAL #1   Title Pt will utilize word finding strategies to communicate/convey abstract complex information independently.    Baseline Moderate Assistance    Time 12    Period Weeks    Status New    Target Date 07/05/20      SLP LONG TERM GOAL #2   Title Pt will use speech intelligibility strategies to achieve >95% intelligibility when conveying complex abstract information.    Baseline moderate assistance    Time 12    Period Weeks    Status New    Target Date 07/05/20      SLP LONG TERM GOAL #3   Title Pt will read complex material with > 75% comprehension.    Baseline short simple paragraph level    Time 12    Period Weeks    Status New    Target Date 07/05/20      SLP LONG TERM GOAL #4   Title Pt will compose a semi-complex email with < 25% spelling errors.    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 Transaminitis   . Hemiparesis  affecting dominant side as late effect of stroke ([place])   . Blood pressure increase diastolic   . Left middle cerebral artery stroke ([place]) 03/23/2020  . Urinary retention   . Dyslipidemia   . Benign essential HTN   . Acute ischemic stroke ([place])   . Global aphasia   . Depression   . Hyperglycemia   . Right hemiparesis ([place])   . Aphasia   . Elevated blood pressure reading   . Stroke (cerebrum) ([place]) 03/17/2020  . Enthesopathy of hip region on both sides 01/26/2019  . Chronic right shoulder pain 12/10/2014  . Scapular dyskinesis 07/29/2014  . Bursitis of right shoulder 03/01/2014   Kynzleigh Bandel B. [place]uther[place] Nail M.S., CCC-SLP, [place] Pathologist Rehabilitation Services Office [phone]  Stormy Fabian 05/03/2020, 7:31 AM  [place] MAIN [facility] SERVICES [address] [place], [place], [address] Phone: [phone]   Fax:  [phone]   Name: Javell Blackburn MRN: [id number] Date of Birth: 18-Feb-1976

## 2020-05-03 NOTE — Patient Instructions (Signed)
Practice developing paragraphs

## 2020-05-04 ENCOUNTER — Ambulatory Visit: Payer: BC Managed Care – PPO

## 2020-05-04 ENCOUNTER — Ambulatory Visit: Payer: BC Managed Care – PPO | Admitting: Occupational Therapy

## 2020-05-04 ENCOUNTER — Encounter: Payer: BC Managed Care – PPO | Admitting: Speech Pathology

## 2020-05-04 ENCOUNTER — Other Ambulatory Visit: Payer: Self-pay

## 2020-05-04 DIAGNOSIS — M6281 Muscle weakness (generalized): Secondary | ICD-10-CM

## 2020-05-04 DIAGNOSIS — R269 Unspecified abnormalities of gait and mobility: Secondary | ICD-10-CM

## 2020-05-04 DIAGNOSIS — I63512 Cerebral infarction due to unspecified occlusion or stenosis of left middle cerebral artery: Secondary | ICD-10-CM | POA: Diagnosis not present

## 2020-05-04 DIAGNOSIS — R278 Other lack of coordination: Secondary | ICD-10-CM

## 2020-05-04 DIAGNOSIS — I69359 Hemiplegia and hemiparesis following cerebral infarction affecting unspecified side: Secondary | ICD-10-CM

## 2020-05-04 DIAGNOSIS — R2681 Unsteadiness on feet: Secondary | ICD-10-CM

## 2020-05-04 DIAGNOSIS — R482 Apraxia: Secondary | ICD-10-CM | POA: Diagnosis not present

## 2020-05-04 DIAGNOSIS — R4701 Aphasia: Secondary | ICD-10-CM | POA: Diagnosis not present

## 2020-05-04 NOTE — Therapy (Signed)
[place] MAIN [facility] SERVICES [address] [place], [place], [address] Phone: [phone]   Fax:  [phone]  Physical Therapy Treatment  Patient Details  Name: Deshawn Witty MRN: [id number] Date of Birth: 1975-09-13 No data recorded  Encounter Date: 05/04/2020   PT End of Session - 05/04/20 0921    Visit Number 8    Number of Visits 25    Date for PT Re-Evaluation 07/05/20    PT Start Time 0930    PT Stop Time 1012    PT Time Calculation (min) 42 min    Equipment Utilized During Treatment Gait belt    Activity Tolerance Patient tolerated treatment well    Behavior During Therapy WFL for tasks assessed/performed           Past Medical History:  Diagnosis Date  . Allergy    Phreesia 04/10/2020  . Depressed   . Hyperlipidemia    Phreesia 04/10/2020  . Hypertension    Phreesia 04/10/2020  . Neuromuscular disorder ([place])    Phreesia 04/10/2020  . Stroke [facility])    Phreesia 04/10/2020    Past Surgical History:  Procedure Laterality Date  . BUBBLE STUDY  03/21/2020   Procedure: BUBBLE STUDY;  Surgeon: [name], MD;  Location: [place];  Service: Cardiology;;  . TEE WITHOUT CARDIOVERSION N/A 03/21/2020   Procedure: TRANSESOPHAGEAL ECHOCARDIOGRAM (TEE);  Surgeon: [name], MD;  Location: [place];  Service: Cardiology;  Laterality: N/A;    There were no vitals filed for this visit.   Subjective Assessment - 05/04/20 0920    Subjective Patient reports he feels physically as far was walking and manuevering around he is back approx 80% with issues coordinating. Reports he has not tried jogging and difficulty lifting objects due to his right hand.    Pertinent History HTN    Limitations Walking;Standing    How long can you sit comfortably? unrestricted    How long can you stand comfortably? 15 mins    How long can you walk comfortably? 1 hour no AD    Patient Stated Goals be able to drive at type    Currently  in Pain? No/denies             Treatment:   Mini Lunge at // bars with back leg on green theraband pad and front leg onto Bosu ball- ball side up - 2sets of 10 reps. Mild unsteadiness - more when leading with right LE. Occasional UE Support for balance.   Static stand on BOSU ball (ball side down) - focusing on equal weight bearing- standing for approx 2 min then 1 min of later weight shifting and 1 min of anterior weight shifting. Mild unsteadiness yet no UE support. Patient was also able to maintain 10 sec of eyes closed with static stand with CGA and no loss of balance.   Floor ladder drills without UE support:  Front steps - 2 steps into each square Lateral steps Front- back- lateral Front -back-diagonal 2 steps in- 2steps out- 2 steps lateral Education provided throughout session via VC/TC and demonstration to facilitate movement at target joints and correct muscle activation for all testing and exercises performed. Patient exhibited difficulty coordinating sequencing of ladder drills.   Step up (purple pad) to 6"step back down to floor- Forward x 10 trials- no loss of balance or difficulty.  Side step up (purple pad) to 6"step back down to floor x 10 trials- no issues Static stand on purple  pad- Eyes open with head turning x 10 each way- patient demonstrated more postural sway yet no loss of balance.  Dynamic marching on purple pad with head turning x 10 BLE (mild increase unsteadiness with occasional reaching with UE support).  12"box step up - dual tasking- stating alphabet with each step (13 reps each leg) - mild difficulty coordinating tasks- did improve and able to complete.   Stand on purple pad - with 4 hedgehog disc strategically placed in front, both sides, and behind pad- Instructed patient to single leg stance and place opp LE onto top of disc when color called- Patient with difficulty with dual tasking- and increased unsteadiness with SLS on right LE x 5 min total time  on task.                           PT Education - 05/04/20 1026    Education Details Specific balance and exercise techniques.    Person(s) Educated Patient    Methods Explanation;Demonstration;Tactile cues;Verbal cues    Comprehension Verbalized understanding;Tactile cues required;Returned demonstration;Need further instruction;Verbal cues required            PT Short Term Goals - 04/12/20 1310      PT SHORT TERM GOAL #1   Title Patient will be independent in home exercise program to improve strength/mobility for better functional independence with ADLs.    Status New    Target Date 07/05/20             PT Long Term Goals - 04/12/20 1311      PT LONG TERM GOAL #1   Title Patient will increase FOTO score to equal to or greater than 72% to demonstrate statistically significant improvement in mobility and quality of life.    Baseline 04/12/20 56%    Status New    Target Date 07/05/20      PT LONG TERM GOAL #2   Title Patient will increase Functional Gait Assessment score to >26/30 as to reduce fall risk and improve dynamic gait safety with community ambulation.    Baseline 04/12/20 FGA 22/30    Status New    Target Date 07/05/20      PT LONG TERM GOAL #3   Title Patient will be able to navigate full flight of stairs independently with reciprocal pattern  to get to next level of home.    Baseline 04/12/20  step to pattern bilateral UE support    Status New    Target Date 07/05/20      PT LONG TERM GOAL #4   Status --    Target Date --                 Plan - 05/04/20 0737    Clinical Impression Statement Patient performed well overall with therex and balance activities. He was more challenged with coordination and higher level balance activites - particularily dual tasking with physical/cognition task. He will continue to benefit from further skilled PT treatment to improve balance to return to PLOF    Personal Factors and Comorbidities  Comorbidity 2    Comorbidities HTN, depression    Examination-Activity Limitations Locomotion Level;Stairs    Examination-Participation Restrictions Yard Work;Driving;Community Activity    Stability/Clinical Decision Making Evolving/Moderate complexity    Rehab Potential Good    PT Frequency 2x / week    PT Duration 12 weeks    PT Treatment/Interventions ADLs/Self Care Home Management;Gait training;Stair training;Functional mobility training;Therapeutic activities;Therapeutic  exercise;Balance training;Neuromuscular re-education;Patient/family education;Passive range of motion    PT Next Visit Plan Continue with progressive balance activities.    PT Home Exercise Plan no changes.    Consulted and Agree with Plan of Care Patient           Patient will benefit from skilled therapeutic intervention in order to improve the following deficits and impairments:  Abnormal gait,Decreased balance,Decreased endurance,Decreased mobility,Difficulty walking,Decreased range of motion,Improper body mechanics,Decreased activity tolerance,Decreased strength,Impaired flexibility,Postural dysfunction  Visit Diagnosis: Other lack of coordination  Muscle weakness (generalized)  Unsteadiness on feet  Abnormality of gait and mobility     Problem List Patient Active Problem List   Diagnosis Date Noted  . Transaminitis   . Hemiparesis affecting dominant side as late effect of stroke ([place])   . Blood pressure increase diastolic   . Left middle cerebral artery stroke ([place]) 03/23/2020  . Urinary retention   . Dyslipidemia   . Benign essential HTN   . Acute ischemic stroke ([place])   . Global aphasia   . Depression   . Hyperglycemia   . Right hemiparesis ([place])   . Aphasia   . Elevated blood pressure reading   . Stroke (cerebrum) ([place]) 03/17/2020  . Enthesopathy of hip region on both sides 01/26/2019  . Chronic right shoulder pain 12/10/2014  . Scapular dyskinesis 07/29/2014  . Bursitis of right  shoulder 03/01/2014    Lewis Moccasin, PT 05/04/2020, 10:48 AM  [place] MAIN [facility] SERVICES [address] [place], [place], [address] Phone: [phone]   Fax:  [phone]  Name: Alder Murri MRN: [id number] Date of Birth: [birth year]

## 2020-05-05 ENCOUNTER — Ambulatory Visit: Payer: BC Managed Care – PPO | Admitting: Adult Health

## 2020-05-05 ENCOUNTER — Encounter: Payer: Self-pay | Admitting: Adult Health

## 2020-05-05 VITALS — BP 128/79 | HR 71 | Ht 69.0 in | Wt 160.0 lb

## 2020-05-05 DIAGNOSIS — R4701 Aphasia: Secondary | ICD-10-CM

## 2020-05-05 DIAGNOSIS — I63512 Cerebral infarction due to unspecified occlusion or stenosis of left middle cerebral artery: Secondary | ICD-10-CM

## 2020-05-05 DIAGNOSIS — Q211 Atrial septal defect: Secondary | ICD-10-CM | POA: Diagnosis not present

## 2020-05-05 DIAGNOSIS — I69319 Unspecified symptoms and signs involving cognitive functions following cerebral infarction: Secondary | ICD-10-CM | POA: Diagnosis not present

## 2020-05-05 DIAGNOSIS — Q2112 Patent foramen ovale: Secondary | ICD-10-CM

## 2020-05-05 NOTE — Progress Notes (Signed)
[facility] [address] [place]. [place] [address] [phone]       HOSPITAL FOLLOW UP NOTE  [name] Date of Birth:  1975/06/14 Medical Record Number:  [id number]   Reason for Referral:  hospital stroke follow up    SUBJECTIVE:   CHIEF COMPLAINT:  Chief Complaint  Patient presents with  . Follow-up    TR alone Pt is well, speech is improving, fine motor in R hand is weak but is improving.     HPI:   [name] is a 45 y.o. male with unknown past medical history who presented to [facility] ED on 03/16/2020 with acute onset of dense expressive and receptive aphasia with right-sided weakness.   Personally reviewed hospitalization pertinent progress notes, lab work and imaging with summary provided.  Initially evaluated by Dr. Erlinda Hong with stroke work-up revealing acute ischemic infarct in the left MCA distribution s/p tPA likely embolic secondary to unclear source.  TEE and TCD positive PFO.  Recommended consideration of PFO closure if no other source of stroke found.  Hypercoagulable panel normal.  LDL 149 -initiate atorvastatin 80 mg daily.  Other stroke risk factors include family history of stroke and THC use but no prior stroke history.  Residual right-sided weakness, aphasia and apraxia.  Evaluated by therapies and recommended discharge to CIR for ongoing therapy needs.  Stroke: Acute ischemic infarct in left MCA distribution s/p tPA likely embolic in origin, unclear source at this point.  Code Stroke CT head: Subtle evolving hypodensity involving the supra ganglionic mid and posterior left frontal lobe, concerning for acute left MCA distribution infarct. ASPECTS 8.  CTA head & neck:  Negative CTA for large vessel occlusion. Mild irregularity of distal left MCA branches, which could be related to recent recanalization.  MRI: Patchy multifocal areas of restricted diffusion seen involving the cortical and subcortical aspect of the left frontal and parietal  lobes as well as the left temporal occipital region, consistent with acute ischemic infarcts, left MCA distribution  Doppler US Lower extremities: No evidence of deep vein thrombosis  2D Echo:  EF 60 to 65%.  TEE - Positive PFO with bubbles crossing the atrial septum within 1-2 heart beats. Right to left shunt.  TCD bubble study: Positive with Grade 2 during rest and Grade 3 on valsalva  LDL 149  HgbA1c 5.1  UDS + THC  Hypercoagulable and autoimmune work up negative  VTE prophylaxis -Lovenox 40 mg  No antithrombotic prior to admission, now on aspirin 81 mg and Plavix 75mg  DAPT for 3 weeks and then ASA alone  Therapy recommendations: CIR   Disposition: d/c to CIR 03/23/2020  Today, 05/05/2020, Mr. 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  Cooper for possible PFO closure   No further concerns at this time      ROS:   14 system review of systems performed and negative with exception of those listed in HPI  PMH:  Past Medical History:  Diagnosis Date  . Allergy    Phreesia 04/10/2020  . Depressed   . Hyperlipidemia     Phreesia 04/10/2020  . Hypertension    Phreesia 04/10/2020  . Neuromuscular disorder ([place])    Phreesia 04/10/2020  . Stroke [facility])    Phreesia 04/10/2020    PSH:  Past Surgical History:  Procedure Laterality Date  . BUBBLE STUDY  03/21/2020   Procedure: BUBBLE STUDY;  Surgeon: [name], MD;  Location: [place];  Service: Cardiology;;  . TEE WITHOUT CARDIOVERSION N/A 03/21/2020   Procedure: TRANSESOPHAGEAL ECHOCARDIOGRAM (TEE);  Surgeon: [name], MD;  Location: [place];  Service: Cardiology;  Laterality: N/A;    Social History:  Social History   Socioeconomic History  . Marital status: Married    Spouse name: Not on file  . Number of children: Not on file  . Years of education: Not on file  . Highest education level: Not on file  Occupational History  . Not on file  Tobacco Use  . Smoking status: Never Smoker  . Smokeless tobacco: Never Used  Substance and Sexual Activity  . Alcohol use: Not on file  . Drug use: Not on file  . Sexual activity: Not on file  Other Topics Concern  . Not on file  Social History Narrative  . Not on file   Social Determinants of Health   Financial Resource Strain: Not on file  Food Insecurity: Not on file  Transportation Needs: Not on file  Physical Activity: Not on file  Stress: Not on file  Social Connections: Not on file  Intimate Partner Violence: Not on file    Family History:  Family History  Problem Relation Age of Onset  . Stroke Paternal Grandfather   . Stroke Maternal Aunt     Medications:   Current Outpatient Medications on File Prior to Visit  Medication Sig Dispense Refill  . aspirin EC 81 MG tablet Take 1 tablet (81 mg total) by mouth daily. Swallow whole. 150 tablet 2  . atorvastatin (LIPITOR) 80 MG tablet Take 1 tablet (80 mg total) by mouth daily. 30 tablet 1  . lisinopril (ZESTRIL) 10 MG tablet Take 1 tablet (10 mg total) by mouth daily. 30 tablet 0  . Multiple Vitamin  (MULTIVITAMIN) tablet Take 1 tablet by mouth daily.    . sertraline (ZOLOFT) 50 MG tablet Take 50 mg by mouth daily.     No current facility-administered medications on file prior to visit.    Allergies:   Allergies  Allergen Reactions  . Albumen, Egg Itching  . Other Hives    Chicken meat, [place]  . Eggs Or Egg-Derived Products Itching      OBJECTIVE:  Physical Exam  Vitals:   05/05/20 1358  BP: 128/79  Pulse: 71  Weight: 160 lb (72.6 kg)  Height: 5\' 9"  (1.753 m)   Body mass index is 23.63 kg/m. No exam data present  Post stroke PHQ 2/9 Depression screen PHQ 2/9 04/19/2020  Decreased Interest 0  Down, Depressed, Hopeless 1  PHQ - 2 Score 1  Altered sleeping 0  Tired, decreased energy 3  Change in appetite 0  Feeling bad or failure about yourself  0  Trouble concentrating 0  Moving  slowly or fidgety/restless 0  Suicidal thoughts 0  PHQ-9 Score 4  Difficult doing work/chores Not difficult at all     General: well developed, well nourished,  very pleasant middle-age Caucasian male, seated, in no evident distress Head: head normocephalic and atraumatic.   Neck: supple with no carotid or supraclavicular bruits Cardiovascular: regular rate and rhythm, no murmurs Musculoskeletal: no deformity Skin:  no rash/petichiae Vascular:  Normal pulses all extremities   Neurologic Exam Mental Status: Awake and fully alert.   Mild to moderate expressive aphasia with speech hesitancy.  No evidence of receptive aphasia or dysarthria.  Follows simple step commands without difficulty.  Oriented to place and time. Recent and remote memory intact. Attention span, concentration and fund of knowledge appropriate during visit. Mood and affect appropriate.  Cranial Nerves: Fundoscopic exam reveals sharp disc margins. Pupils equal, briskly reactive to light. Extraocular movements full without nystagmus. Visual fields full to confrontation. Hearing intact. Facial sensation intact. Face,  tongue, palate moves normally and symmetrically.  Motor: Normal bulk and tone. Normal strength in all tested extremity muscles except slightly decreased right grip strength Sensory.: intact to touch , pinprick , position and vibratory sensation.  Coordination: Rapid alternating movements normal in all extremities except slightly decreased right hand. Finger-to-nose RUE apraxia and heel-to-shin performed accurately bilaterally. Gait and Station: Arises from chair without difficulty. Stance is normal. Gait demonstrates normal stride length and balance with without use of assistive device. Tandem walk and heel toe without difficulty.  Reflexes: 1+ and symmetric. Toes downgoing.     NIHSS  2 Modified Rankin  2-3      ASSESSMENT: Cody Henson is a 45 y.o. year old male presented with acute onset of dense expressive and receptive aphasia and right-sided weakness on 03/16/2020 with stroke work-up revealing acute ischemic infarct in the left MCA distribution s/p tPA likely embolic secondary to unclear source. Vascular risk factors include +PFO on TCD and TEE, HLD and THC use.      PLAN:  1. Left MCA stroke:  a. Residual deficit: speech and language difficulties, mild cognitive impairment, right hand weakness and mild imbalance.  i. Continue working with outpatient therapies for likely ongoing recovery ii. Would not recommend return to driving or work at this time due to residual deficits iii. He has been summoned for jury duty on 4/14 -letter will be provided requesting patient be excused due to residual deficit and would likely have great difficulty being a juror  b. Continue aspirin 81 mg daily  and atorvastatin for secondary stroke prevention.   c. Discussed secondary stroke prevention measures and importance of close PCP follow up for aggressive stroke risk factor management  2. PFO: As evidenced on TCD and TEE. ROPE score 6 indicating 62% likelihood stroke was due to PFO.  Schedule  appointment Dr. Burt Knack next week to discuss PFO closure 3. HTN: BP goal <130/90.  Stable on lisinopril per PCP 4. HLD: LDL goal <70. Recent LDL 149 -initiated atorvastatin 80 mg daily.  Request follow-up with PCP in the next 1 to 2 months for repeat lipid panel and ongoing prescribing of atorvastatin     Follow up in 3 months or call earlier if needed   CC:  [facility] provider: Dr. Leonie Man    I spent 45 minutes of face-to-face and non-face-to-face time with patient.  This included previsit chart review including recent hospitalization pertinent progress notes, lab work and imaging, lab review, study review, order entry, electronic health record documentation, patient education regarding  recent stroke and possible etiology, residual deficits, PFO closure, importance of managing stroke risk factors and answered all other questions to patient satisfaction  Frann Rider, [facility]  [facility] [address] [place], [place] [address]  Phone [phone] Fax [phone] Note: This document was prepared with digital dictation and possible smart phrase technology. Any transcriptional errors that result from this process are unintentional.

## 2020-05-05 NOTE — Patient Instructions (Addendum)
Continue working with therapies at [facility]  Continue aspirin 81 mg daily  and atorvastatin for secondary stroke prevention  Follow-up with Dr. Burt Knack as scheduled to discuss PFO closure  Continue to follow up with PCP regarding cholesterol and blood pressure management  Maintain strict control of hypertension with blood pressure goal below 130/90 and cholesterol with LDL cholesterol (bad cholesterol) goal below 70 mg/dL.      Followup in the future with me in 3 months or call earlier if needed     Thank you for coming to see [place] at [facility]. I hope we have been able to provide you high quality care today.  You may receive a patient satisfaction survey over the next few weeks. We would appreciate your feedback and comments so that we may continue to improve ourselves and the health of our patients.

## 2020-05-08 ENCOUNTER — Encounter: Payer: Self-pay | Admitting: Occupational Therapy

## 2020-05-08 NOTE — Progress Notes (Signed)
I agree with the above plan 

## 2020-05-08 NOTE — Therapy (Signed)
[place] MAIN [facility] SERVICES [address] [place], [place], [address] Phone: [phone]   Fax:  [phone]  Occupational Therapy Treatment  Patient Details  Name: Cody Henson MRN: [id number] Date of Birth: 1975-06-08 No data recorded  Encounter Date: 05/04/2020   OT End of Session - 05/08/20 1411    Visit Number 8    Number of Visits 24    Date for OT Re-Evaluation 07/05/20    OT Start Time 0831    OT Stop Time 0915    OT Time Calculation (min) 44 min    Activity Tolerance Patient tolerated treatment well    Behavior During Therapy [facility] for tasks assessed/performed           Past Medical History:  Diagnosis Date  . Allergy    Phreesia 04/10/2020  . Depressed   . Hyperlipidemia    Phreesia 04/10/2020  . Hypertension    Phreesia 04/10/2020  . Neuromuscular disorder ([place])    Phreesia 04/10/2020  . Stroke [facility])    Phreesia 04/10/2020    Past Surgical History:  Procedure Laterality Date  . BUBBLE STUDY  03/21/2020   Procedure: BUBBLE STUDY;  Surgeon: [name], MD;  Location: [place];  Service: Cardiology;;  . TEE WITHOUT CARDIOVERSION N/A 03/21/2020   Procedure: TRANSESOPHAGEAL ECHOCARDIOGRAM (TEE);  Surgeon: [name], MD;  Location: [place];  Service: Cardiology;  Laterality: N/A;    There were no vitals filed for this visit.   Subjective Assessment - 05/08/20 1410    Subjective  Pt reports he didn't bring in any items today, ready to work more on hand and control.    Pertinent History Per medial records, Cody Henson is a 45 y.o. male with history of depression who was admitted on 03/16/20 with right sided weakness, facial weakness and aphasia. CT head showed evolving hypodensity in mid/posterior left frontal lobe and CTA was negative for LVO. UDS showed THC. He received tPA and follow up MRI showed patchy multifocal acute ischemic infarcts in left cerebral hemisphere without hemorrhagic  transformation.    Patient Stated Goals Pt would like to be as independent as possible, wants to drive and return to work.    Currently in Pain? No/denies    Pain Score 0-No pain            Neuromuscular Reeducation:  Manipulation of [place] discs picking up, placing, stacking 2 types.  Difficulty with flipping motion and demonstrates decreased isolated finger movements.  Stacking up to 8 at a time, able to perform whole hand hold grasp for stack of 3.   Graded pressure focus with resistive clips light to heavy, alternating from light to heavy and cues for adjusting level of tension with right hand.   Attempted Chinese balls but demonstrates difficulty with task.  Manual assist from therapist to work towards patterns to move in a clockwise and counterclockwise direction.    Response to tx: Pt improving significantly with hand function over the past couple weeks.  Now able to incorporate middle finger into tasks for a 3 point pinch skill.  Continues to demonstrate difficulty with graded pressure at times and with higher level hand function skills.  Patient remains highly motivated and has been consistent with performing exercises at home.  Continue to work towards goals in plan of care to increase independence in necessary daily tasks.  OT Education - 05/08/20 1411    Education Details RUE functional use, HEP schedule    Person(s) Educated Patient    Methods Explanation;Demonstration    Comprehension Verbalized understanding;Returned demonstration               OT Long Term Goals - 04/16/20 2040      OT LONG TERM GOAL #1   Title Pt will be independent with home exercise program    Baseline no program at eval    Time 12    Period Weeks    Status New    Target Date 07/05/20      OT LONG TERM GOAL #2   Title Pt will complete dressing skills, UE and LE with modified independence    Baseline difficulty with socks and shoes at times,  unable to tie laces    Time 6    Period Weeks    Status New    Target Date 05/24/20      OT LONG TERM GOAL #3   Title Pt will improve right grip strength by 15# to assist with cutting meat with modified independence.    Baseline Eval unable    Time 12    Period Weeks    Status New    Target Date 07/06/18      OT LONG TERM GOAL #4   Title Pt will improve right hand coordination to brush teeth without difficulty.    Baseline Eval:has to use left hand currently    Time 6    Period Weeks    Status New    Target Date 05/24/20      OT LONG TERM GOAL #5   Title Patient will improve typing speed greater than 12 WPM to answer emails and perform work tasks.    Baseline Eval:  adjust WPM 4.    Time 12    Period Weeks    Status New    Target Date 07/05/20      Long Term Additional Goals   Additional Long Term Goals Yes      OT LONG TERM GOAL #6   Title Patient will improve strength in right hand to pick up 4 yo daughter from high chair.    Baseline unable at eval    Time 12    Period Weeks    Status New    Target Date 07/05/20      OT LONG TERM GOAL #7   Title Patient will improve hand function to write and sign his name on important papers with legibilty greater than 50%    Baseline Eval: 25%, poor legibility    Time 12    Period Weeks    Status New    Target Date 07/05/20      OT LONG TERM GOAL #8   Title Pt will improve FOTO score to 65 or greater to show clinically relevant change to engage in ADL and IADL tasks with greater independence.    Baseline Eval 51    Time 12    Period Weeks    Status New    Target Date 07/05/20                 Plan - 05/08/20 1412    Clinical Impression Statement Pt improving significantly with hand function over the past couple weeks.  Now able to incorporate middle finger into tasks for a 3 point pinch skill.  Continues to demonstrate difficulty with graded pressure at times and with higher level hand  function skills.  Patient  remains highly motivated and has been consistent with performing exercises at home.  Continue to work towards goals in plan of care to increase independence in necessary daily tasks.    OT Occupational Profile and History Detailed Assessment- Review of Records and additional review of physical, cognitive, psychosocial history related to current functional performance    Occupational performance deficits (Please refer to evaluation for details): ADL's;IADL's;Work;Leisure;Social Participation    Body Structure / Function / Physical Skills ADL;Dexterity;Strength;Balance;Coordination;FMC;IADL;Endurance;Sensation;UE functional use;Proprioception    Psychosocial Skills Environmental  Adaptations;Habits;Routines and Behaviors    Rehab Potential Good    Clinical Decision Making Several treatment options, min-mod task modification necessary    Comorbidities Affecting Occupational Performance: May have comorbidities impacting occupational performance    Modification or Assistance to Complete Evaluation  No modification of tasks or assist necessary to complete eval    OT Frequency 2x / week    OT Duration 12 weeks    OT Treatment/Interventions Self-care/ADL training;Cryotherapy;Therapeutic exercise;DME and/or AE instruction;Balance training;Neuromuscular education;Manual Therapy;Moist Heat;Contrast Bath;Therapeutic activities;Patient/family education    Consulted and Agree with Plan of Care Patient           Patient will benefit from skilled therapeutic intervention in order to improve the following deficits and impairments:   Body Structure / Function / Physical Skills: ADL,Dexterity,Strength,Balance,Coordination,FMC,IADL,Endurance,Sensation,UE functional use,Proprioception   Psychosocial Skills: Environmental  Adaptations,Habits,Routines and Behaviors   Visit Diagnosis: Muscle weakness (generalized)  Other lack of coordination  Hemiparesis affecting dominant side as late effect of stroke  [facility])    Problem List Patient Active Problem List   Diagnosis Date Noted  . Transaminitis   . Hemiparesis affecting dominant side as late effect of stroke ([place])   . Blood pressure increase diastolic   . Left middle cerebral artery stroke ([place]) 03/23/2020  . Urinary retention   . Dyslipidemia   . Benign essential HTN   . Acute ischemic stroke ([place])   . Global aphasia   . Depression   . Hyperglycemia   . Right hemiparesis ([place])   . Aphasia   . Elevated blood pressure reading   . Stroke (cerebrum) ([place]) 03/17/2020  . Enthesopathy of hip region on both sides 01/26/2019  . Chronic right shoulder pain 12/10/2014  . Scapular dyskinesis 07/29/2014  . Bursitis of right shoulder 03/01/2014   Cody Henson T Tomasita Morrow, OTR/L, CLT  Cody Henson 05/08/2020, 3:16 PM  [place] MAIN [facility] SERVICES [address] [place], [place], [address] Phone: [phone]   Fax:  [phone]  Name: Cody Henson MRN: [id number] Date of Birth: [birth year]

## 2020-05-09 ENCOUNTER — Other Ambulatory Visit: Payer: Self-pay

## 2020-05-09 ENCOUNTER — Encounter: Payer: Self-pay | Admitting: Occupational Therapy

## 2020-05-09 ENCOUNTER — Ambulatory Visit: Payer: BC Managed Care – PPO | Admitting: Occupational Therapy

## 2020-05-09 ENCOUNTER — Ambulatory Visit: Payer: BC Managed Care – PPO

## 2020-05-09 ENCOUNTER — Ambulatory Visit: Payer: BC Managed Care – PPO | Admitting: Speech Pathology

## 2020-05-09 DIAGNOSIS — R269 Unspecified abnormalities of gait and mobility: Secondary | ICD-10-CM | POA: Diagnosis not present

## 2020-05-09 DIAGNOSIS — R4701 Aphasia: Secondary | ICD-10-CM

## 2020-05-09 DIAGNOSIS — M6281 Muscle weakness (generalized): Secondary | ICD-10-CM

## 2020-05-09 DIAGNOSIS — R482 Apraxia: Secondary | ICD-10-CM | POA: Diagnosis not present

## 2020-05-09 DIAGNOSIS — R278 Other lack of coordination: Secondary | ICD-10-CM

## 2020-05-09 DIAGNOSIS — I69359 Hemiplegia and hemiparesis following cerebral infarction affecting unspecified side: Secondary | ICD-10-CM | POA: Diagnosis not present

## 2020-05-09 DIAGNOSIS — R2681 Unsteadiness on feet: Secondary | ICD-10-CM

## 2020-05-09 DIAGNOSIS — I63512 Cerebral infarction due to unspecified occlusion or stenosis of left middle cerebral artery: Secondary | ICD-10-CM

## 2020-05-09 NOTE — Therapy (Signed)
[place] MAIN [facility] SERVICES [address] [place], [place], [address] Phone: [phone]   Fax:  [phone]  Physical Therapy Treatment  Patient Details  Name: Cody Henson MRN: [id number] Date of Birth: [birth year] No data recorded  Encounter Date: 05/09/2020   PT End of Session - 05/09/20 0825    Visit Number 9    Number of Visits 25    Date for PT Re-Evaluation 07/05/20    PT Start Time 0817    PT Stop Time 0858    PT Time Calculation (min) 41 min    Equipment Utilized During Treatment Gait belt    Activity Tolerance Patient tolerated treatment well    Behavior During Therapy [facility] for tasks assessed/performed           Past Medical History:  Diagnosis Date  . Allergy    Phreesia 04/10/2020  . Depressed   . Hyperlipidemia    Phreesia 04/10/2020  . Hypertension    Phreesia 04/10/2020  . Neuromuscular disorder ([place])    Phreesia 04/10/2020  . Stroke [facility])    Phreesia 04/10/2020    Past Surgical History:  Procedure Laterality Date  . BUBBLE STUDY  03/21/2020   Procedure: BUBBLE STUDY;  Surgeon: [name], MD;  Location: [place];  Service: Cardiology;;  . TEE WITHOUT CARDIOVERSION N/A 03/21/2020   Procedure: TRANSESOPHAGEAL ECHOCARDIOGRAM (TEE);  Surgeon: [name], MD;  Location: [place];  Service: Cardiology;  Laterality: N/A;    There were no vitals filed for this visit.   Subjective Assessment - 05/09/20 0823    Subjective Patient reports he is doing well today- spent the weekend with in-laws for wifes birthday.    Pertinent History HTN    Limitations Walking;Standing    How long can you sit comfortably? unrestricted    How long can you stand comfortably? 15 mins    How long can you walk comfortably? 1 hour no AD    Patient Stated Goals be able to drive at type    Currently in Pain? No/denies           Therapeutic exercises:  Leg press (Precor)   BLE at 70lb. X 12 x 1 set LLE at 55  lb x 12 x 2 sets RLE at 55 x 12 x 2 sets VC and visual demo for technique- No significant difficulty and patient able to complete reporting activity as "medium"   Calf press (precor)   BLE (warm up at 55lb) x 12 reps BLE 70lb x 12 reps  X 3 sets Patient presents with good control and rated exercise as "medium"      Neuromuscular Re-ed:  Tandem walk in //bars on 1/2 white foam roll (flat side down) x 5 down and back.  Side step in //bars on 1/2 white foam roll (flat side down) x 5 down and back  Ladder drills: 1)1 step into each square 2) 2 step into each square 3) Lateral side/Front step/Lateral step/Back step 4) Start with feet outside of ladder square then both feet into square- then diagonal forward, then both feet into next square Patient presented with difficulty coordinating movement - more with fatigue yet performed fairly well with approx 75% accuracy - yet at slower speed to maintain correct form.   Airex pad High knees- 1 min Single leg stance x 20 sec x 2 sets each leg- (mild unsteadiness yet no reaching for UE support at //bars)  Single leg stance  With head turns -  Patient unable to maintain > 3 sec each leg with increased difficulty requiring min UE support.   Education provided throughout session via VC/TC and demonstration to facilitate movement at target joints and correct muscle activation for all testing and exercises performed.   Clinical Impression: Patient presents with good ability to perform LE strengthening today without significant difficulty. He was challenged with ladder and SLS activities- mild difficulty with coordination yet did improve overall today with practice. He will benefit from continued skilled PT services to progress his higher level balance and return to previous level of function.                       PT Education - 05/09/20 0824    Education Details specific balance and exercise form/techniques for safety with work  out    [employer]) Educated Patient    Methods Explanation;Demonstration;Verbal cues    Comprehension Verbalized understanding;Returned demonstration;Need further instruction;Verbal cues required            PT Short Term Goals - 04/12/20 1310      PT SHORT TERM GOAL #1   Title Patient will be independent in home exercise program to improve strength/mobility for better functional independence with ADLs.    Status New    Target Date 07/05/20             PT Long Term Goals - 04/12/20 1311      PT LONG TERM GOAL #1   Title Patient will increase FOTO score to equal to or greater than 72% to demonstrate statistically significant improvement in mobility and quality of life.    Baseline 04/12/20 56%    Status New    Target Date 07/05/20      PT LONG TERM GOAL #2   Title Patient will increase Functional Gait Assessment score to >26/30 as to reduce fall risk and improve dynamic gait safety with community ambulation.    Baseline 04/12/20 FGA 22/30    Status New    Target Date 07/05/20      PT LONG TERM GOAL #3   Title Patient will be able to navigate full flight of stairs independently with reciprocal pattern  to get to next level of home.    Baseline 04/12/20  step to pattern bilateral UE support    Status New    Target Date 07/05/20      PT LONG TERM GOAL #4   Status --    Target Date --                 Plan - 05/09/20 2446    Clinical Impression Statement Patient presents with good ability to perform LE strengthening today without significant difficulty. He was challenged with ladder and SLS activities- mild difficulty with coordination yet did improve overall today with practice. He will benefit from continued skilled PT services to progress his higher level balance and return to previous level of function.    Personal Factors and Comorbidities Comorbidity 2    Comorbidities HTN, depression    Examination-Activity Limitations Locomotion Level;Stairs     Examination-Participation Restrictions Yard Work;Driving;Community Activity    Stability/Clinical Decision Making Evolving/Moderate complexity    Rehab Potential Good    PT Frequency 2x / week    PT Duration 12 weeks    PT Treatment/Interventions ADLs/Self Care Home Management;Gait training;Stair training;Functional mobility training;Therapeutic activities;Therapeutic exercise;Balance training;Neuromuscular re-education;Patient/family education;Passive range of motion    PT Next Visit Plan Continue with progressive balance activities.  PT Home Exercise Plan no changes.    Consulted and Agree with Plan of Care Patient           Patient will benefit from skilled therapeutic intervention in order to improve the following deficits and impairments:  Abnormal gait,Decreased balance,Decreased endurance,Decreased mobility,Difficulty walking,Decreased range of motion,Improper body mechanics,Decreased activity tolerance,Decreased strength,Impaired flexibility,Postural dysfunction  Visit Diagnosis: Other lack of coordination  Muscle weakness (generalized)  Unsteadiness on feet  Abnormality of gait and mobility     Problem List Patient Active Problem List   Diagnosis Date Noted  . Transaminitis   . Hemiparesis affecting dominant side as late effect of stroke ([place])   . Blood pressure increase diastolic   . Left middle cerebral artery stroke ([place]) 03/23/2020  . Urinary retention   . Dyslipidemia   . Benign essential HTN   . Acute ischemic stroke ([place])   . Global aphasia   . Depression   . Hyperglycemia   . Right hemiparesis ([place])   . Aphasia   . Elevated blood pressure reading   . Stroke (cerebrum) ([place]) 03/17/2020  . Enthesopathy of hip region on both sides 01/26/2019  . Chronic right shoulder pain 12/10/2014  . Scapular dyskinesis 07/29/2014  . Bursitis of right shoulder 03/01/2014    Lewis Moccasin, PT 05/09/2020, 9:23 AM  [place] MAIN [facility] SERVICES [address] [place], [place], [address] Phone: [phone]   Fax:  [phone]  Name: Valdemar Mcclenahan MRN: [id number] Date of Birth: [birth year]

## 2020-05-10 NOTE — Therapy (Signed)
[place] MAIN [facility] SERVICES [address] [place], [place], [address] Phone: [phone]   Fax:  [phone]  Speech Language Pathology Treatment  Patient Details  Name: Cody Henson MRN: [id number] Date of Birth: Jun 26, 1975 Referring Provider (SLP): Reesa Chew, [place]   Encounter Date: 05/09/2020   End of Session - 05/10/20 1219    Visit Number 7    Number of Visits 25    Date for SLP Re-Evaluation 07/05/20    Authorization Type [employer]    Authorization Time Period 04/12/2020 thru 07/05/2020    Authorization - Visit Number 7    Progress Note Due on Visit 10    SLP Start Time 0900    SLP Stop Time  1000    SLP Time Calculation (min) 60 min    Activity Tolerance Patient tolerated treatment well           Past Medical History:  Diagnosis Date  . Allergy    Phreesia 04/10/2020  . Depressed   . Hyperlipidemia    Phreesia 04/10/2020  . Hypertension    Phreesia 04/10/2020  . Neuromuscular disorder ([place])    Phreesia 04/10/2020  . Stroke [facility])    Phreesia 04/10/2020    Past Surgical History:  Procedure Laterality Date  . BUBBLE STUDY  03/21/2020   Procedure: BUBBLE STUDY;  Surgeon: [name], MD;  Location: [place];  Service: Cardiology;;  . TEE WITHOUT CARDIOVERSION N/A 03/21/2020   Procedure: TRANSESOPHAGEAL ECHOCARDIOGRAM (TEE);  Surgeon: [name], MD;  Location: [place];  Service: Cardiology;  Laterality: N/A;    There were no vitals filed for this visit.   Subjective Assessment - 05/10/20 1218    Subjective pt pleasant, brought completed "friends and family" email    Currently in Pain? No/denies          Neuro   ST   TX   NOTE          Treatment Data and Patient's Response to Treatment   Skilled treatment session focused on pt's language goals. Pt brought in his finished "friends and family" email. Overall the email was very well constructed and contained minimal functional  spelling errors that were very well within the normal limits. Pt stated that it took him much longer than he had expected to organize the information into a cohesive flow as well as organize information within each paragraph. To target paragraph organization, pt was asked to generate cohesive sentences and then with moderate cues, he organized sentences into a cohesive description of black and white still pictures x 3. To target sentence structure, SLP provided ways to combine multiple sentences within his email into 1-2 cohesive complex sentences. While pt voiced understanding, he will likely require further instruction as he performs tasks independently for homework.   Per pt, to return to work, he will need to write cohesive sentences and paragraphs. As such, skilled ST intervention is required to target this deficit thereby increasing pt's ability to return to the workforce.          SLP Education - 05/10/20 1219    Education Details paragraph organization, run-on sentences    Person(s) Educated Patient    Methods Explanation;Demonstration;Verbal cues;Handout    Comprehension Verbalized understanding;Returned demonstration;Need further instruction            SLP Short Term Goals - 04/12/20 1710      SLP SHORT TERM GOAL #1   Title Pt will utilize speech intelligibility strategies to  produce speech intelligibility to >95% at the complex sentence level.    Baseline Minimal assistance for basic sentences    Time 10    Period --   sessions   Status New      SLP SHORT TERM GOAL #2   Title With moderate cues, pt will utilize word finding strategies during a description task to effective produce 3 descriptive sentences with >75%.    Baseline expressive langauge contains lots of fillers    Time 10    Period --   sessions   Status New      SLP SHORT TERM GOAL #3   Title With moderate cues, pt will increase reading comprehension by answering [facility] questions after reading semi-complex  information with 50% accuracy.    Baseline maximal for work related scientific papers    Time 10    Period --   sessions   Status New      SLP SHORT TERM GOAL #4   Title With minimal cues, pt will correctly spell functional phrases with > 90% accuracy.    Baseline unable to spell greetings for email responses    Time 10    Period --   sessions   Status New            SLP Long Term Goals - 04/12/20 1719      SLP LONG TERM GOAL #1   Title Pt will utilize word finding strategies to communicate/convey abstract complex information independently.    Baseline Moderate Assistance    Time 12    Period Weeks    Status New    Target Date 07/05/20      SLP LONG TERM GOAL #2   Title Pt will use speech intelligibility strategies to achieve >95% intelligibility when conveying complex abstract information.    Baseline moderate assistance    Time 12    Period Weeks    Status New    Target Date 07/05/20      SLP LONG TERM GOAL #3   Title Pt will read complex material with > 75% comprehension.    Baseline short simple paragraph level    Time 12    Period Weeks    Status New    Target Date 07/05/20      SLP LONG TERM GOAL #4   Title Pt will compose a semi-complex email with < 25% spelling errors.    Baseline unable to spell basic greetings    Time 12    Period Weeks    Status New    Target Date 07/05/20            Plan - 05/10/20 1220    Speech Therapy Frequency 1x /week    Duration 12 weeks    Treatment/Interventions Language facilitation;Compensatory techniques;SLP instruction and feedback;Patient/family education    Potential to Achieve Goals Good    SLP Home Exercise Plan provided, see instruction section    Consulted and Agree with Plan of Care Patient           Patient will benefit from skilled therapeutic intervention in order to improve the following deficits and impairments:   Aphasia  Apraxia  Left middle cerebral artery stroke [facility])    Problem  List Patient Active Problem List   Diagnosis Date Noted  . Transaminitis   . Hemiparesis affecting dominant side as late effect of stroke ([place])   . Blood pressure increase diastolic   . Left middle cerebral artery stroke ([place]) 03/23/2020  . Urinary  retention   . Dyslipidemia   . Benign essential HTN   . Acute ischemic stroke ([place])   . Global aphasia   . Depression   . Hyperglycemia   . Right hemiparesis ([place])   . Aphasia   . Elevated blood pressure reading   . Stroke (cerebrum) ([place]) 03/17/2020  . Enthesopathy of hip region on both sides 01/26/2019  . Chronic right shoulder pain 12/10/2014  . Scapular dyskinesis 07/29/2014  . Bursitis of right shoulder 03/01/2014   Kennis Wissmann B. Rutherford Nail M.S., CCC-SLP, [place] Pathologist Rehabilitation Services Office [phone]  Stormy Fabian 05/10/2020, 12:20 PM  [place] MAIN [facility] SERVICES [address] [place], [place], [address] Phone: [phone]   Fax:  [phone]   Name: Cody Henson MRN: [id number] Date of Birth: October 15, 1975

## 2020-05-10 NOTE — Patient Instructions (Signed)
Construct short paragraph describing black and white still pictures

## 2020-05-11 ENCOUNTER — Other Ambulatory Visit: Payer: Self-pay

## 2020-05-11 ENCOUNTER — Ambulatory Visit: Payer: BC Managed Care – PPO

## 2020-05-11 ENCOUNTER — Ambulatory Visit: Payer: BC Managed Care – PPO | Admitting: Occupational Therapy

## 2020-05-11 DIAGNOSIS — M6281 Muscle weakness (generalized): Secondary | ICD-10-CM | POA: Diagnosis not present

## 2020-05-11 DIAGNOSIS — R4701 Aphasia: Secondary | ICD-10-CM | POA: Diagnosis not present

## 2020-05-11 DIAGNOSIS — R278 Other lack of coordination: Secondary | ICD-10-CM

## 2020-05-11 DIAGNOSIS — I63512 Cerebral infarction due to unspecified occlusion or stenosis of left middle cerebral artery: Secondary | ICD-10-CM | POA: Diagnosis not present

## 2020-05-11 DIAGNOSIS — R269 Unspecified abnormalities of gait and mobility: Secondary | ICD-10-CM

## 2020-05-11 DIAGNOSIS — R482 Apraxia: Secondary | ICD-10-CM

## 2020-05-11 DIAGNOSIS — R2681 Unsteadiness on feet: Secondary | ICD-10-CM

## 2020-05-11 DIAGNOSIS — I69359 Hemiplegia and hemiparesis following cerebral infarction affecting unspecified side: Secondary | ICD-10-CM | POA: Diagnosis not present

## 2020-05-11 NOTE — Therapy (Signed)
[place] MAIN [facility] SERVICES [address] [place], [place], [address] Phone: [phone]   Fax:  [phone]  Physical Therapy Treatment/Physical Therapy Progress Note   Dates of reporting period  04/12/2020  to   05/11/2020  Patient Details  Name: Cody Henson MRN: [id number] Date of Birth: [birth year] No data recorded  Encounter Date: 05/11/2020   PT End of Session - 05/11/20 0931    Visit Number 10    Number of Visits 25    Date for PT Re-Evaluation 07/05/20    PT Start Time 0930    PT Stop Time 1013    PT Time Calculation (min) 43 min    Equipment Utilized During Treatment Gait belt    Activity Tolerance Patient tolerated treatment well    Behavior During Therapy WFL for tasks assessed/performed           Past Medical History:  Diagnosis Date  . Allergy    Phreesia 04/10/2020  . Depressed   . Hyperlipidemia    Phreesia 04/10/2020  . Hypertension    Phreesia 04/10/2020  . Neuromuscular disorder ([place])    Phreesia 04/10/2020  . Stroke [facility])    Phreesia 04/10/2020    Past Surgical History:  Procedure Laterality Date  . BUBBLE STUDY  03/21/2020   Procedure: BUBBLE STUDY;  Surgeon: [name], MD;  Location: [place];  Service: Cardiology;;  . TEE WITHOUT CARDIOVERSION N/A 03/21/2020   Procedure: TRANSESOPHAGEAL ECHOCARDIOGRAM (TEE);  Surgeon: [name], MD;  Location: [place];  Service: Cardiology;  Laterality: N/A;    There were no vitals filed for this visit.   Subjective Assessment - 05/11/20 0930    Subjective Patient reports he is doing well today- States he had a restful day yesterday. He states he feels he is doing well but would like to continue to work on progressing his strength and balance.    Pertinent History HTN    Limitations Walking;Standing    How long can you sit comfortably? unrestricted    How long can you stand comfortably? 15 mins    How long can you walk comfortably? 1 hour  no AD    Patient Stated Goals be able to drive at type    Currently in Pain? No/denies            Reassessed Goals today and patient is progressing with overall balance and strength as seen by improvement in FOTO score, Step assessment, Functional gait assessment goals.   Patient performed resistive ladder drill workout using 12.5 lb including 1 step into square, double step into square, in and out, Lateral/front/back drills x 2 passes each. Patient exhibited mild difficulty overall with resistance- intermittent loss of balance with self recovery and 1 episode requiring min Assistance. Patient did improve with practice.   Cones shaped into a circle with patient standing on 1 LE and tapping corresponding cones- multidirection in clock shape fashion x trial each Leg. Patient required CGA and exhibited mild unsteadiness with tapping today.   Clinical Impression: Patient has made significant gains as seen by improved score on FGA and ability to now negotiate steps independently without UE support and reciprocal steps. He continues to be challenged with higher level balance activities and Patient's condition has the potential to improve in response to therapy. Maximum improvement is yet to be obtained. The anticipated improvement is attainable and reasonable in a generally predictable time.  PT Education - 05/11/20 1641    Education Details specific balance technique    Person(s) Educated Patient    Methods Explanation;Demonstration;Tactile cues;Verbal cues    Comprehension Verbalized understanding;Returned demonstration;Verbal cues required;Tactile cues required            PT Short Term Goals - 05/11/20 1642      PT SHORT TERM GOAL #1   Title Patient will be independent in home exercise program to improve strength/mobility for better functional independence with ADLs.    Baseline 05/11/2020. Patient presents with good working knowledge of home  program but will continue to engage in progressive Home program desinged for strength/balance.    Status New    Target Date 07/05/20             PT Long Term Goals - 05/11/20 0937      PT LONG TERM GOAL #1   Title Patient will increase FOTO score to equal to or greater than 72% to demonstrate statistically significant improvement in mobility and quality of life.    Baseline 04/12/20 56%, 05/11/2020= 84%    Status Achieved      PT LONG TERM GOAL #2   Title Patient will increase Functional Gait Assessment score to >26/30 as to reduce fall risk and improve dynamic gait safety with community ambulation.    Baseline 04/12/20 FGA 22/30; 05/11/2020 FGA= 28/30    Time 0    Status On-going      PT LONG TERM GOAL #3   Title Patient will be able to navigate full flight of stairs independently with reciprocal pattern  to get to next level of home.    Baseline 04/12/20  step to pattern bilateral UE support. 05/11/2020- Patient able to demo independence with stair- recirpocal and no UE support for >15 steps.    Status Achieved    Target Date 07/05/20                 Plan - 05/11/20 1642    Clinical Impression Statement Patient has made significant gains as seen by improved score on FGA and ability to now negotiate steps independently without UE support and reciprocal steps. He continues to be challenged with higher level balance activities and Patient's condition has the potential to improve in response to therapy. Maximum improvement is yet to be obtained. The anticipated improvement is attainable and reasonable in a generally predictable time.    Personal Factors and Comorbidities Comorbidity 2    Comorbidities HTN, depression    Examination-Activity Limitations Locomotion Level;Stairs    Examination-Participation Restrictions Yard Work;Driving;Community Activity    Stability/Clinical Decision Making Evolving/Moderate complexity    Rehab Potential Good    PT Frequency 2x / week    PT  Duration 12 weeks    PT Treatment/Interventions ADLs/Self Care Home Management;Gait training;Stair training;Functional mobility training;Therapeutic activities;Therapeutic exercise;Balance training;Neuromuscular re-education;Patient/family education;Passive range of motion    PT Next Visit Plan Continue with progressive balance activities.    PT Home Exercise Plan no changes.    Consulted and Agree with Plan of Care Patient           Patient will benefit from skilled therapeutic intervention in order to improve the following deficits and impairments:  Abnormal gait,Decreased balance,Decreased endurance,Decreased mobility,Difficulty walking,Decreased range of motion,Improper body mechanics,Decreased activity tolerance,Decreased strength,Impaired flexibility,Postural dysfunction  Visit Diagnosis: Other lack of coordination  Muscle weakness (generalized)  Unsteadiness on feet  Abnormality of gait and mobility     Problem List Patient Active Problem List  Diagnosis Date Noted  . Transaminitis   . Hemiparesis affecting dominant side as late effect of stroke ([place])   . Blood pressure increase diastolic   . Left middle cerebral artery stroke ([place]) 03/23/2020  . Urinary retention   . Dyslipidemia   . Benign essential HTN   . Acute ischemic stroke ([place])   . Global aphasia   . Depression   . Hyperglycemia   . Right hemiparesis ([place])   . Aphasia   . Elevated blood pressure reading   . Stroke (cerebrum) ([place]) 03/17/2020  . Enthesopathy of hip region on both sides 01/26/2019  . Chronic right shoulder pain 12/10/2014  . Scapular dyskinesis 07/29/2014  . Bursitis of right shoulder 03/01/2014    Lewis Moccasin, PT 05/11/2020, 4:59 PM  [place] MAIN [facility] SERVICES [address] [place], [place], [address] Phone: [phone]   Fax:  [phone]  Name: Anis Degidio MRN: [id number] Date of Birth: [birth year]

## 2020-05-12 ENCOUNTER — Encounter: Payer: Self-pay | Admitting: Cardiovascular Disease

## 2020-05-12 ENCOUNTER — Encounter: Payer: Self-pay | Admitting: Occupational Therapy

## 2020-05-12 ENCOUNTER — Ambulatory Visit: Payer: BC Managed Care – PPO | Admitting: Cardiovascular Disease

## 2020-05-12 VITALS — BP 110/80 | HR 73 | Ht 69.0 in | Wt 162.0 lb

## 2020-05-12 DIAGNOSIS — Q211 Atrial septal defect: Secondary | ICD-10-CM

## 2020-05-12 DIAGNOSIS — Q2112 Patent foramen ovale: Secondary | ICD-10-CM

## 2020-05-12 NOTE — Therapy (Signed)
[place] MAIN [facility] SERVICES [address] [place], [place], [address] Phone: [phone]   Fax:  [phone]  Occupational Therapy Treatment/Progress Update Reporting period from 04/12/2020 to 05/11/2020  Patient Details  Name: Cody Henson MRN: [id number] Date of Birth: [birth year] No data recorded  Encounter Date: 05/11/2020   OT End of Session - 05/12/20 1211    Visit Number 10    Number of Visits 24    Date for OT Re-Evaluation 07/05/20    OT Start Time 0828    OT Stop Time 0930    OT Time Calculation (min) 62 min    Activity Tolerance Patient tolerated treatment well    Behavior During Therapy [facility] for tasks assessed/performed           Past Medical History:  Diagnosis Date  . Allergy    Phreesia 04/10/2020  . Depressed   . Hyperlipidemia    Phreesia 04/10/2020  . Hypertension    Phreesia 04/10/2020  . Neuromuscular disorder ([place])    Phreesia 04/10/2020  . Stroke [facility])    Phreesia 04/10/2020    Past Surgical History:  Procedure Laterality Date  . BUBBLE STUDY  03/21/2020   Procedure: BUBBLE STUDY;  Surgeon: [name], MD;  Location: [place];  Service: Cardiology;;  . TEE WITHOUT CARDIOVERSION N/A 03/21/2020   Procedure: TRANSESOPHAGEAL ECHOCARDIOGRAM (TEE);  Surgeon: [name], MD;  Location: [place];  Service: Cardiology;  Laterality: N/A;    There were no vitals filed for this visit.   Subjective Assessment - 05/12/20 1210    Pertinent History Per medial records, Cody Henson is a 45 y.o. male with history of depression who was admitted on 03/16/20 with right sided weakness, facial weakness and aphasia. CT head showed evolving hypodensity in mid/posterior left frontal lobe and CTA was negative for LVO. UDS showed THC. He received tPA and follow up MRI showed patchy multifocal acute ischemic infarcts in left cerebral hemisphere without hemorrhagic transformation.              [facility] OT  Assessment - 05/12/20 1214      Assessment   Medical Diagnosis CVA    Onset Date/Surgical Date 03/16/20    Hand Dominance Right    Next MD Visit 05/05/2020    Prior Therapy in the hospital      Coordination   Right 9 Hole Peg Test 1 min 50 secs      Hand Function   Right Hand Grip (lbs) 115    Right Hand Lateral Pinch 24 lbs    Right Hand 3 Point Pinch 17 lbs    Left Hand Grip (lbs) 130    Left Hand Lateral Pinch 17 lbs    Left 3 point pinch 18 lbs    Comment 2 point pinch  R 16#          Therapeutic Exercise: Patient seen this date for reassessment of goals and hand function, refer to flowsheet for details. Patient performing upper body strengthening with use of UBE, performed in sitting this date, 8 minutes total, forwards backwards and alternating levels of resistance from 3.2-3.5.  Therapist in constant attendance to ensure grip on the right side and to adjust settings.   Neuromuscular Reeducation:   Patient seen for manipulation skills of 100 pegboard, cues for tripod grasping pattern when engaging in task using the right upper extremity. Tool used with regular scissors with some difficulty with apraxia and determining how to place fingers into  correct loops.  Once he completed multiple reps task was easier and was able to cut out a variety of lines, straight and curved.  Attempted to use loop scissors however felt these were more difficult to maintain in his hand. Utilized 2 different sizes of "the pencil grip gripper" applied to pens.  Patient prefers the larger grip at this point, issued larger grip to use at home with trials of handwriting for name and address.  Patient able to demonstrate handwriting during session with improved legibility than at eval however still has impairment and needs to continue to focus in this area.  Response to tx: Patient has continued to make excellent gains in all areas.  Patient's right grip strength improved from 45 pounds to 115 pounds  over the last 4 weeks.  Pinch skills improved as well and patient is improving with limb apraxia.  He is able to now demonstrate a three-point prehension pattern more consistently to assist with performing functional grasping and manipulation tasks.  Patient remains highly motivated and looks for opportunities and ways to incorporate activities from therapy into his daily exercises at home.  Continue to work towards refining hand skills for improved independence at home and potential return to work possibilities.  Patient continues to benefit from skilled occupational therapy services to maximize safety and independence with ADL and IADL tasks.                OT Education - 05/12/20 1211    Education Details RUE functional use, HEP, goals, progress    Person(s) Educated Patient    Methods Explanation;Demonstration    Comprehension Verbalized understanding;Returned demonstration               OT Long Term Goals - 05/12/20 1215      OT LONG TERM GOAL #1   Title Pt will be independent with home exercise program    Baseline no program at eval, 05/11/20: ongoing changes to HEP    Time 12    Period Weeks    Status On-going    Target Date 07/05/20      OT LONG TERM GOAL #2   Title Pt will complete dressing skills, UE and LE with modified independence    Baseline difficulty with socks and shoes at times, unable to tie laces.  05/11/2020: Progressing occasional difficulty with tying laces.    Time 6    Period Weeks    Status On-going    Target Date 05/24/20      OT LONG TERM GOAL #3   Title Pt will improve right grip strength by 15# to assist with cutting meat with modified independence.    Baseline Eval unable, 05/11/2020: Difficulty with cutting meat secondary to decreased sensation however grip strength has improved dramatically.    Time 12    Period Weeks    Status Partially Met    Target Date 07/05/20      OT LONG TERM GOAL #4   Title Pt will improve right hand  coordination to brush teeth without difficulty.    Baseline Eval:has to use left hand currently, 05/11/2020: Tasks still uncoordinated    Time 6    Period Weeks    Status On-going    Target Date 05/24/20      OT LONG TERM GOAL #5   Title Patient will improve typing speed greater than 12 WPM to answer emails and perform work tasks.    Baseline Eval:  adjust WPM 4.05/11/2020: Partially met is improved to  8 words a minute    Time 12    Period Weeks    Status Partially Met    Target Date 07/05/20      OT LONG TERM GOAL #6   Title Patient will improve strength in right hand to pick up 19 yo daughter from high chair.    Baseline unable at eval    Time 12    Period Weeks    Status Partially Met    Target Date 07/05/20      OT LONG TERM GOAL #7   Title Patient will improve hand function to write and sign his name on important papers with legibilty greater than 50%    Baseline Eval: 25%, poor legibility, 05/11/2020 still working on improving legibility    Time 12    Period Weeks    Status On-going    Target Date 07/05/20      OT LONG TERM GOAL #8   Title Pt will improve FOTO score to 65 or greater to show clinically relevant change to engage in ADL and IADL tasks with greater independence.    Baseline Eval 51    Time 12    Period Weeks    Status On-going    Target Date 07/05/20                 Plan - 05/12/20 1212    Clinical Impression Statement Patient has continued to make excellent gains in all areas.  Patient's right grip strength improved from 45 pounds to 115 pounds over the last 4 weeks.  Pinch skills improved as well and patient is improving with limb apraxia.  He is able to now demonstrate a three-point prehension pattern more consistently to assist with performing functional grasping and manipulation tasks.  Patient remains highly motivated and looks for opportunities and ways to incorporate activities from therapy into his daily exercises at home.  Continue to work  towards refining hand skills for improved independence at home and potential return to work possibilities.  Patient continues to benefit from skilled occupational therapy services to maximize safety and independence with ADL and IADL tasks.    OT Occupational Profile and History Detailed Assessment- Review of Records and additional review of physical, cognitive, psychosocial history related to current functional performance    Occupational performance deficits (Please refer to evaluation for details): ADL's;IADL's;Work;Leisure;Social Participation    Body Structure / Function / Physical Skills ADL;Dexterity;Strength;Balance;Coordination;FMC;IADL;Endurance;Sensation;UE functional use;Proprioception    Psychosocial Skills Environmental  Adaptations;Habits;Routines and Behaviors    Rehab Potential Good    Clinical Decision Making Several treatment options, min-mod task modification necessary    Comorbidities Affecting Occupational Performance: May have comorbidities impacting occupational performance    Modification or Assistance to Complete Evaluation  No modification of tasks or assist necessary to complete eval    OT Frequency 2x / week    OT Duration 12 weeks    OT Treatment/Interventions Self-care/ADL training;Cryotherapy;Therapeutic exercise;DME and/or AE instruction;Balance training;Neuromuscular education;Manual Therapy;Moist Heat;Contrast Bath;Therapeutic activities;Patient/family education    Consulted and Agree with Plan of Care Patient           Patient will benefit from skilled therapeutic intervention in order to improve the following deficits and impairments:   Body Structure / Function / Physical Skills: ADL,Dexterity,Strength,Balance,Coordination,FMC,IADL,Endurance,Sensation,UE functional use,Proprioception   Psychosocial Skills: Environmental  Adaptations,Habits,Routines and Behaviors   Visit Diagnosis: Muscle weakness (generalized)  Other lack of coordination  Hemiparesis  affecting dominant side as late effect of stroke (HCC)  Apraxia  Problem List Patient Active Problem List   Diagnosis Date Noted  . Transaminitis   . Hemiparesis affecting dominant side as late effect of stroke ([place])   . Blood pressure increase diastolic   . Left middle cerebral artery stroke ([place]) 03/23/2020  . Urinary retention   . Dyslipidemia   . Benign essential HTN   . Acute ischemic stroke ([place])   . Global aphasia   . Depression   . Hyperglycemia   . Right hemiparesis ([place])   . Aphasia   . Elevated blood pressure reading   . Stroke (cerebrum) ([place]) 03/17/2020  . Enthesopathy of hip region on both sides 01/26/2019  . Chronic right shoulder pain 12/10/2014  . Scapular dyskinesis 07/29/2014  . Bursitis of right shoulder 03/01/2014   Doshie Maggi T Tomasita Morrow, OTR/L, CLT  Harvey Lingo 05/12/2020, 12:51 PM  [place] MAIN [facility] SERVICES [address] [place], [place], [address] Phone: [phone]   Fax:  [phone]  Name: Cody Henson MRN: [id number] Date of Birth: June 18, 1975

## 2020-05-12 NOTE — Therapy (Signed)
[place] MAIN [facility] SERVICES [address] [place], [place], [address] Phone: [phone]   Fax:  [phone]  Occupational Therapy Treatment  Patient Details  Name: Cody Henson MRN: [id number] Date of Birth: [birth year] No data recorded  Encounter Date: 05/09/2020   OT End of Session - 05/11/20 0826    Visit Number 9    Number of Visits 24    Date for OT Re-Evaluation 07/05/20    OT Start Time 1100    OT Stop Time 1158    OT Time Calculation (min) 58 min    Activity Tolerance Patient tolerated treatment well    Behavior During Therapy [facility] for tasks assessed/performed           Past Medical History:  Diagnosis Date  . Allergy    Phreesia 04/10/2020  . Depressed   . Hyperlipidemia    Phreesia 04/10/2020  . Hypertension    Phreesia 04/10/2020  . Neuromuscular disorder ([place])    Phreesia 04/10/2020  . Stroke [facility])    Phreesia 04/10/2020    Past Surgical History:  Procedure Laterality Date  . BUBBLE STUDY  03/21/2020   Procedure: BUBBLE STUDY;  Surgeon: [name], MD;  Location: [place];  Service: Cardiology;;  . TEE WITHOUT CARDIOVERSION N/A 03/21/2020   Procedure: TRANSESOPHAGEAL ECHOCARDIOGRAM (TEE);  Surgeon: [name], MD;  Location: [place];  Service: Cardiology;  Laterality: N/A;    There were no vitals filed for this visit.   Subjective Assessment - 05/11/20 0825    Subjective  Pt purchased a set of chinese balls to use at home.    Pertinent History Per medial records, Cody Henson is a 45 y.o. male with history of depression who was admitted on 03/16/20 with right sided weakness, facial weakness and aphasia. CT head showed evolving hypodensity in mid/posterior left frontal lobe and CTA was negative for LVO. UDS showed THC. He received tPA and follow up MRI showed patchy multifocal acute ischemic infarcts in left cerebral hemisphere without hemorrhagic transformation.    Patient Stated Goals Pt  would like to be as independent as possible, wants to drive and return to work.    Currently in Pain? No/denies    Pain Score 0-No pain          Neuromuscular reeducation:  Patient seen for instruction on use of Chinese balls, rotation attempts clockwise and counterclockwise with effort for deliberate movements, thumb and finger combos Making a perch for ball with use of fingers, difficulty with this task due to apraxia.  Worked towards forming the fingers with the left hand and modeling for the right, attempts to perform movement bilaterally.  Improved with focus and trials [place] disc to pick up and  push forward to tips of fingers on radial side of the hand with thumb, index and middle finger. Continued Work on 2 separate sides of hand between fine motor function and power, targeting these with various items Unknotting task with use of medium nylon rope with difficulty, larger rope used but still has impairments and will need additional focus Use of Perfection game with oppositional movements to pick up shaped items with stem, cues for prehension patterns Manipulation of Large buttons from tabletop to place into container, Working towards smaller buttons but has increased difficulty as size decreases. 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    Response to tx: Patient has continued to make excellent progress in all areas and is improving with R hand functional use, advancing towards more complex hand movements.  Patient continues to simulate and find activities at home  which support the work he is doing in therapy and is consistent with performing HEP.  Will plan to assess pen grasp and use of adapted pen grips next session as well as do a progress update with measurements.  Continue to work towards goals in plan of care to maximize safety and independence in necessary daily tasks at home, work and in the community.                        OT Education - 05/11/20 0825    Education Details RUE functional use, HEP schedule    Person(s) Educated Patient    Methods Explanation;Demonstration    Comprehension Verbalized understanding;Returned demonstration               OT Long Term Goals - 04/16/20 2040      OT LONG TERM GOAL #1   Title Pt will be independent with home exercise program    Baseline no program at eval    Time 12    Period Weeks    Status New    Target Date 07/05/20      OT LONG TERM GOAL #2   Title Pt will complete dressing skills, UE and LE with modified independence    Baseline difficulty with socks and shoes at times, unable to tie laces    Time 6    Period Weeks    Status New    Target Date 05/24/20      OT LONG TERM GOAL #3   Title Pt will improve right grip strength by 15# to assist with cutting meat with modified independence.    Baseline Eval unable    Time 12    Period Weeks    Status New    Target Date 07/06/18      OT LONG TERM GOAL #4   Title Pt will improve right hand coordination to brush teeth without difficulty.    Baseline Eval:has to use left hand currently    Time 6    Period Weeks    Status New    Target Date 05/24/20      OT LONG TERM GOAL #5   Title Patient will improve typing speed greater than 12 WPM to answer emails and perform work tasks.    Baseline Eval:  adjust WPM 4.    Time 12    Period Weeks    Status New    Target Date 07/05/20      Long Term Additional Goals   Additional Long Term Goals Yes      OT LONG TERM GOAL #6   Title Patient will improve strength in  right hand to pick up 17 yo daughter from high chair.    Baseline unable at eval    Time 12    Period Weeks    Status New    Target Date 07/05/20      OT LONG TERM GOAL #7   Title Patient will improve hand function to write and sign  his name on important papers with legibilty greater than 50%    Baseline Eval: 25%, poor legibility    Time 12    Period Weeks    Status New    Target Date 07/05/20      OT LONG TERM GOAL #8   Title Pt will improve FOTO score to 65 or greater to show clinically relevant change to engage in ADL and IADL tasks with greater independence.    Baseline Eval 51    Time 12    Period Weeks    Status New    Target Date 07/05/20                 Plan - 05/11/20 0827    Clinical Impression Statement Patient has continued to make excellent progress in all areas and is improving with R hand functional use, advancing towards more complex hand movements.  Patient continues to simulate and find activities at home which support the work he is doing in therapy and is consistent with performing HEP.  Will plan to assess pen grasp and use of adapted pen grips next session as well as do a progress update with measurements.  Continue to work towards goals in plan of care to maximize safety and independence in necessary daily tasks at home, work and in the community.    OT Occupational Profile and History Detailed Assessment- Review of Records and additional review of physical, cognitive, psychosocial history related to current functional performance    Occupational performance deficits (Please refer to evaluation for details): ADL's;IADL's;Work;Leisure;Social Participation    Body Structure / Function / Physical Skills ADL;Dexterity;Strength;Balance;Coordination;FMC;IADL;Endurance;Sensation;UE functional use;Proprioception    Psychosocial Skills Environmental  Adaptations;Habits;Routines and Behaviors    Rehab Potential Good    Clinical Decision Making Several treatment  options, min-mod task modification necessary    Comorbidities Affecting Occupational Performance: May have comorbidities impacting occupational performance    Modification or Assistance to Complete Evaluation  No modification of tasks or assist necessary to complete eval    OT Frequency 2x / week    OT Duration 12 weeks    OT Treatment/Interventions Self-care/ADL training;Cryotherapy;Therapeutic exercise;DME and/or AE instruction;Balance training;Neuromuscular education;Manual Therapy;Moist Heat;Contrast Bath;Therapeutic activities;Patient/family education    Consulted and Agree with Plan of Care Patient           Patient will benefit from skilled therapeutic intervention in order to improve the following deficits and impairments:   Body Structure / Function / Physical Skills: ADL,Dexterity,Strength,Balance,Coordination,FMC,IADL,Endurance,Sensation,UE functional use,Proprioception   Psychosocial Skills: Environmental  Adaptations,Habits,Routines and Behaviors   Visit Diagnosis: Muscle weakness (generalized)  Apraxia  Other lack of coordination  Hemiparesis affecting dominant side as late effect of stroke [facility])    Problem List Patient Active Problem List   Diagnosis Date Noted  . Transaminitis   . Hemiparesis affecting dominant side as late effect of stroke ([place])   . Blood pressure increase diastolic   . 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 Urinary retention   . Dyslipidemia   . Benign essential HTN   . Acute ischemic stroke ([place])   . Global aphasia   . Depression   . Hyperglycemia   . Right hemiparesis ([place])   . Aphasia   . Elevated blood pressure reading   . Stroke (cerebrum) ([place]) 03/17/2020  . Enthesopathy of hip region on both sides 01/26/2019  . Chronic right shoulder pain 12/10/2014  . Scapular dyskinesis 07/29/2014  . Bursitis of right shoulder 03/01/2014   Tasheena Wambolt T Maritsa Hunsucker, OTR/L,  CLT  Venise Ellingwood 05/12/2020, 8:43 AM  [place] MAIN [facility] SERVICES [address] [place], [place], [address] Phone: [phone]   Fax:  [phone]  Name: Cody Henson MRN: [id number] Date of Birth: Oct 04, 1975

## 2020-05-12 NOTE — Progress Notes (Signed)
Cardiology Office Note:    Date:  05/18/2020   ID:  Cody Henson, DOB 1975/04/22, MRN [id number]  PCP:  Camillia Herter, NP   [place]  Cardiologist:  No primary care provider on file.  Advanced Practice Provider:  No care team member to display Electrophysiologist:  None  Structural Heart Clinic:  None       Referring MD: No ref. provider found   Chief Complaint  Patient presents with  . PFO    History of Present Illness:    [name] is a 45 y.o. male referred by Dr. Leonie Man for evaluation of PFO.  The patient is here with his wife today.  They have a 70-year-old child and are expecting another child in June.  On March 16, 2020, he developed acute onset of right hemiparesis, facial droop, and aphasia.  He was found to have an acute left MCA stroke without hemorrhage.  The patient was treated with TPA.  Follow-up MRI study showed mild scattered petechial hemorrhage but no frank hemorrhagic transformation.  CTA study showed no evidence of large vessel vascular disease.  Bilateral venous Doppler studies were negative.  Hypercoagulability panel was negative.  Transesophageal echo study demonstrated PFO with right to left shunting confirmed by agitated saline study.  The patient presents today to discuss PFO closure.  He has clinically improved.  Speech is better and he continues to participate in rehab.  No chest pain or shortness of breath.  The patient has not been able to return to work yet.  Complaints include generalized fatigue.  No heart palpitations, orthopnea, PND, or leg swelling.   Past Medical History:  Diagnosis Date  . Allergy    Phreesia 04/10/2020  . Depressed   . Hyperlipidemia    Phreesia 04/10/2020  . Hypertension    Phreesia 04/10/2020  . Neuromuscular disorder ([place])    Phreesia 04/10/2020  . Stroke [facility])    Phreesia 04/10/2020    Past Surgical History:  Procedure Laterality Date  . BUBBLE STUDY  03/21/2020   Procedure: BUBBLE  STUDY;  Surgeon: [name], MD;  Location: [place];  Service: Cardiology;;  . TEE WITHOUT CARDIOVERSION N/A 03/21/2020   Procedure: TRANSESOPHAGEAL ECHOCARDIOGRAM (TEE);  Surgeon: [name], MD;  Location: [place];  Service: Cardiology;  Laterality: N/A;    Current Medications: Current Meds  Medication Sig  . aspirin EC 81 MG tablet Take 1 tablet (81 mg total) by mouth daily. Swallow whole.  Marland Kitchen atorvastatin (LIPITOR) 80 MG tablet Take 1 tablet (80 mg total) by mouth daily.  Marland Kitchen lisinopril (ZESTRIL) 10 MG tablet Take 1 tablet (10 mg total) by mouth daily.  . Multiple Vitamin (MULTIVITAMIN) tablet Take 1 tablet by mouth daily.  . sertraline (ZOLOFT) 25 MG tablet Take 25 mg by mouth daily.  Marland Kitchen vortioxetine HBr (TRINTELLIX) 10 MG TABS tablet      Allergies:   Albumen, egg; Other; and Eggs or egg-derived products   Social History   Socioeconomic History  . Marital status: Married    Spouse name: Not on file  . Number of children: Not on file  . Years of education: Not on file  . Highest education level: Not on file  Occupational History  . Not on file  Tobacco Use  . Smoking status: Never Smoker  . Smokeless tobacco: Never Used  Substance and Sexual Activity  . Alcohol use: Not on file  . Drug use: Not on file  . Sexual activity:  Not on file  Other Topics Concern  . Not on file  Social History Narrative  . Not on file   Social Determinants of Health   Financial Resource Strain: Not on file  Food Insecurity: Not on file  Transportation Needs: Not on file  Physical Activity: Not on file  Stress: Not on file  Social Connections: Not on file     Family History: The patient's family history includes Stroke in his maternal aunt and paternal grandfather.  ROS:   Please see the history of present illness.    All other systems reviewed and are negative.  EKGs/Labs/Other Studies Reviewed:    The following studies were reviewed today: TCD Bubble  Study: Between 10 to 15 HITS (High Intensity Transient Signals) were heard at  rest, indicating a Spencer Grade 2 PFO (Patent Foramen Ovale) at rest.  Between 15 to 50 HITS were heard during valsalva, indicating a Spencer  Grade 3 PFO with valsalva maneuver.   TEE: IMPRESSIONS    1. Evidence of atrial level shunting detected by color flow Doppler.  Agitated saline contrast bubble study was positive with shunting observed  within 3-6 cardiac cycles suggestive of interatrial shunt.  2. Left ventricular ejection fraction, by estimation, is 65 to 70%. The  left ventricle has normal function. The left ventricle has no regional  wall motion abnormalities.  3. Right ventricular systolic function is normal. The right ventricular  size is normal.  4. No left atrial/left atrial appendage thrombus was detected.  5. The mitral valve is normal in structure. Trivial mitral valve  regurgitation. No evidence of mitral stenosis.  6. The aortic valve is normal in structure. Aortic valve regurgitation is  not visualized. No aortic stenosis is present.  7. Aortic dilatation noted. There is borderline dilatation of the aortic  root, measuring 38 mm.   Conclusion(s)/Recommendation(s): Normal biventricular function without  evidence of hemodynamically significant valvular heart disease. PFO  present with right to left shunt.   EKG:  EKG is ordered today.  The ekg ordered today demonstrates NSR 73 bpm, within normal limits  Recent Labs: 03/18/2020: TSH 1.782 03/28/2020: ALT 55; BUN 15; Creatinine, Ser 1.04; Potassium 3.9; Sodium 141 04/04/2020: Hemoglobin 15.4; Platelets 358  Recent Lipid Panel    Component Value Date/Time   CHOL 212 (H) 03/17/2020 0344   TRIG 113 03/17/2020 0344   HDL 40 (L) 03/17/2020 0344   CHOLHDL 5.3 03/17/2020 0344   VLDL 23 03/17/2020 0344   LDLCALC 149 (H) 03/17/2020 0344     Risk Assessment/Calculations:       Physical Exam:    VS:  BP 110/80   Pulse 73    Ht 5\' 9"  (1.753 m)   Wt 162 lb (73.5 kg)   SpO2 96%   BMI 23.92 kg/m     Wt Readings from Last 3 Encounters:  05/17/20 161 lb 6.4 oz (73.2 kg)  05/12/20 162 lb (73.5 kg)  05/05/20 160 lb (72.6 kg)     GEN:  Well nourished, well developed in no acute distress HEENT: Normal NECK: No JVD; No carotid bruits LYMPHATICS: No lymphadenopathy CARDIAC: RRR, no murmurs, rubs, gallops RESPIRATORY:  Clear to auscultation without rales, wheezing or rhonchi  ABDOMEN: Soft, non-tender, non-distended MUSCULOSKELETAL:  No edema; No deformity  SKIN: Warm and dry NEUROLOGIC:  Alert and oriented x 3 PSYCHIATRIC:  Normal affect   ASSESSMENT:    1. PFO (patent foramen ovale)    PLAN:    In order of problems listed  above:  Transesophageal images are personally reviewed.  The patient has normal left ventricular function and no significant valvular disease.  There is clear demonstration of a moderate sized PFO with positive agitated saline study demonstrating right to left shunting at rest.  We reviewed clinical trial data regarding medical therapy versus transcatheter PFO closure for secondary stroke prevention.  Considering the patient's age and lack of other risk factors, there is a high probability that his stroke was "PFO related."  It is reasonable to pursue transcatheter closure in this setting. I reviewed the risks, indications, and alternatives to transcatheter PFO closure with the patient. Specific risks include bleeding, infection, device embolization, stroke, cardiac perforation, tamponade, arrhythmia, MI, and late device erosion. He understands these serious risks occur at low incidence of < 1%. The patient provides full informed consent for the procedure. He will be started on ASA and clopidogrel before the procedure.  He understands that he will require dual antiplatelet therapy with aspirin and clopidogrel for a period of 6 months following PFO closure.  He understand that he will require SBE  prophylaxis when indicated for a period of 6 months following PFO closure.  Medication Adjustments/Labs and Tests Ordered: Current medicines are reviewed at length with the patient today.  Concerns regarding medicines are outlined above.  Orders Placed This Encounter  Procedures  . EKG 12-Lead   No orders of the defined types were placed in this encounter.   Patient Instructions  You are scheduled for a follow-up with Nell Range, PA.    Signed, Sherren Mocha, MD  05/18/2020 11:48 AM    [place]

## 2020-05-12 NOTE — Patient Instructions (Addendum)
You are scheduled for a follow-up with Nell Range, PA.

## 2020-05-16 ENCOUNTER — Ambulatory Visit: Payer: BC Managed Care – PPO | Admitting: Speech Pathology

## 2020-05-16 ENCOUNTER — Other Ambulatory Visit: Payer: Self-pay

## 2020-05-16 ENCOUNTER — Ambulatory Visit: Payer: BC Managed Care – PPO

## 2020-05-16 ENCOUNTER — Ambulatory Visit: Payer: BC Managed Care – PPO | Admitting: Occupational Therapy

## 2020-05-16 DIAGNOSIS — R482 Apraxia: Secondary | ICD-10-CM | POA: Diagnosis not present

## 2020-05-16 DIAGNOSIS — M6281 Muscle weakness (generalized): Secondary | ICD-10-CM | POA: Diagnosis not present

## 2020-05-16 DIAGNOSIS — R2681 Unsteadiness on feet: Secondary | ICD-10-CM | POA: Diagnosis not present

## 2020-05-16 DIAGNOSIS — I69359 Hemiplegia and hemiparesis following cerebral infarction affecting unspecified side: Secondary | ICD-10-CM | POA: Diagnosis not present

## 2020-05-16 DIAGNOSIS — R278 Other lack of coordination: Secondary | ICD-10-CM | POA: Diagnosis not present

## 2020-05-16 DIAGNOSIS — R4701 Aphasia: Secondary | ICD-10-CM | POA: Diagnosis not present

## 2020-05-16 DIAGNOSIS — R269 Unspecified abnormalities of gait and mobility: Secondary | ICD-10-CM | POA: Diagnosis not present

## 2020-05-16 DIAGNOSIS — I63512 Cerebral infarction due to unspecified occlusion or stenosis of left middle cerebral artery: Secondary | ICD-10-CM | POA: Diagnosis not present

## 2020-05-16 NOTE — Therapy (Signed)
[place] MAIN [facility] SERVICES [address] [place], [place], [address] Phone: [phone]   Fax:  [phone]  Speech Language Pathology Treatment  Patient Details  Name: Cody Henson MRN: [id number] Date of Birth: Apr 21, 1975 Referring Provider (SLP): Reesa Chew, [place]   Encounter Date: 05/16/2020   End of Session - 05/16/20 1410    Visit Number 8    Number of Visits 25    Date for SLP Re-Evaluation 07/05/20    Authorization Type [employer]    Authorization Time Period 04/12/2020 thru 07/05/2020    Authorization - Visit Number 8    Progress Note Due on Visit 10    SLP Start Time 0900    SLP Stop Time  1000    SLP Time Calculation (min) 60 min    Activity Tolerance Patient tolerated treatment well           Past Medical History:  Diagnosis Date  . Allergy    Phreesia 04/10/2020  . Depressed   . Hyperlipidemia    Phreesia 04/10/2020  . Hypertension    Phreesia 04/10/2020  . Neuromuscular disorder ([place])    Phreesia 04/10/2020  . Stroke [facility])    Phreesia 04/10/2020    Past Surgical History:  Procedure Laterality Date  . BUBBLE STUDY  03/21/2020   Procedure: BUBBLE STUDY;  Surgeon: [name], MD;  Location: [place];  Service: Cardiology;;  . TEE WITHOUT CARDIOVERSION N/A 03/21/2020   Procedure: TRANSESOPHAGEAL ECHOCARDIOGRAM (TEE);  Surgeon: [name], MD;  Location: [place];  Service: Cardiology;  Laterality: N/A;    There were no vitals filed for this visit.   Subjective Assessment - 05/16/20 1408    Subjective pt pleasant, brought in completed homework    Currently in Pain? No/denies          Neuro   ST   TX   NOTE          Treatment Data and Patient's Response to Treatment   Skilled treatment session focused on pt's expressive language goals. Pt had completed homework with great language abilities. It is very difficulty to differentiate baseline dislike of writing vs possible acute  language deficits. At baseline, pt reports that he had great difficulty writing and "hated it." Prior to CVA, he reports needing to read information 3 times for through comprehension. Reading comprehension task provided with good ability demonstrated. He did provide another example, when preparing a meal at home, he needed to refer back to the directions for additional steps. This is likely a compensatory strategy that is very effective to aid in further progress. He reports that all of the above activities are getting better as he practices each one.   Pt is demonstrating great progress towards his goals and he is approaching the end of his course of ST services. Pt and his wife will diligently monitor all language abilities over the course of this week to assess need for further ST services.          SLP Education - 05/16/20 1410    Education Details reading comprehension, memory strategies    Person(s) Educated Patient    Methods Explanation;Demonstration;Verbal cues    Comprehension Verbalized understanding;Returned demonstration            SLP Short Term Goals - 04/12/20 1710      SLP SHORT TERM GOAL #1   Title Pt will utilize speech intelligibility strategies to produce speech intelligibility to >95% at the complex sentence  level.    Baseline Minimal assistance for basic sentences    Time 10    Period --   sessions   Status New      SLP SHORT TERM GOAL #2   Title With moderate cues, pt will utilize word finding strategies during a description task to effective produce 3 descriptive sentences with >75%.    Baseline expressive langauge contains lots of fillers    Time 10    Period --   sessions   Status New      SLP SHORT TERM GOAL #3   Title With moderate cues, pt will increase reading comprehension by answering [facility] questions after reading semi-complex information with 50% accuracy.    Baseline maximal for work related scientific papers    Time 10    Period --   sessions    Status New      SLP SHORT TERM GOAL #4   Title With minimal cues, pt will correctly spell functional phrases with > 90% accuracy.    Baseline unable to spell greetings for email responses    Time 10    Period --   sessions   Status New            SLP Long Term Goals - 04/12/20 1719      SLP LONG TERM GOAL #1   Title Pt will utilize word finding strategies to communicate/convey abstract complex information independently.    Baseline Moderate Assistance    Time 12    Period Weeks    Status New    Target Date 07/05/20      SLP LONG TERM GOAL #2   Title Pt will use speech intelligibility strategies to achieve >95% intelligibility when conveying complex abstract information.    Baseline moderate assistance    Time 12    Period Weeks    Status New    Target Date 07/05/20      SLP LONG TERM GOAL #3   Title Pt will read complex material with > 75% comprehension.    Baseline short simple paragraph level    Time 12    Period Weeks    Status New    Target Date 07/05/20      SLP LONG TERM GOAL #4   Title Pt will compose a semi-complex email with < 25% spelling errors.    Baseline unable to spell basic greetings    Time 12    Period Weeks    Status New    Target Date 07/05/20            Plan - 05/16/20 1410    Speech Therapy Frequency 1x /week    Duration 12 weeks    Treatment/Interventions Language facilitation;Compensatory techniques;SLP instruction and feedback;Patient/family education    Potential to Achieve Goals Good    SLP Home Exercise Plan provided    Consulted and Agree with Plan of Care Patient           Patient will benefit from skilled therapeutic intervention in order to improve the following deficits and impairments:   Left middle cerebral artery stroke [facility])    Problem List Patient Active Problem List   Diagnosis Date Noted  . Transaminitis   . Hemiparesis affecting dominant side as late effect of stroke ([place])   . Blood pressure increase  diastolic   . Left middle cerebral artery stroke ([place]) 03/23/2020  . Urinary retention   . Dyslipidemia   . Benign essential HTN   . Acute ischemic  stroke ([place])   . Global aphasia   . Depression   . Hyperglycemia   . Right hemiparesis ([place])   . Aphasia   . Elevated blood pressure reading   . Stroke (cerebrum) ([place]) 03/17/2020  . Enthesopathy of hip region on both sides 01/26/2019  . Chronic right shoulder pain 12/10/2014  . Scapular dyskinesis 07/29/2014  . Bursitis of right shoulder 03/01/2014   Truxton Stupka B. Rutherford Nail M.S., CCC-SLP, [place] Pathologist Rehabilitation Services Office [phone]  Stormy Fabian 05/16/2020, 2:11 PM  [place] MAIN [facility] SERVICES [address] [place], [place], [address] Phone: [phone]   Fax:  [phone]   Name: Cody Henson MRN: [id number] Date of Birth: 14-May-1975

## 2020-05-16 NOTE — Therapy (Signed)
[place] MAIN [facility] SERVICES [address] [place], [place], [address] Phone: [phone]   Fax:  [phone]  Occupational Therapy Treatment  Patient Details  Name: Cody Henson MRN: [id number] Date of Birth: 1975/10/07 No data recorded  Encounter Date: 05/16/2020   OT End of Session - 05/16/20 1012    Visit Number 11    Number of Visits 24    Date for OT Re-Evaluation 07/05/20    OT Start Time 1002    OT Stop Time 1048    OT Time Calculation (min) 46 min    Activity Tolerance Patient tolerated treatment well    Behavior During Therapy [facility] for tasks assessed/performed           Past Medical History:  Diagnosis Date  . Allergy    Phreesia 04/10/2020  . Depressed   . Hyperlipidemia    Phreesia 04/10/2020  . Hypertension    Phreesia 04/10/2020  . Neuromuscular disorder ([place])    Phreesia 04/10/2020  . Stroke [facility])    Phreesia 04/10/2020    Past Surgical History:  Procedure Laterality Date  . BUBBLE STUDY  03/21/2020   Procedure: BUBBLE STUDY;  Surgeon: [name], MD;  Location: [place];  Service: Cardiology;;  . TEE WITHOUT CARDIOVERSION N/A 03/21/2020   Procedure: TRANSESOPHAGEAL ECHOCARDIOGRAM (TEE);  Surgeon: [name], MD;  Location: [place];  Service: Cardiology;  Laterality: N/A;    There were no vitals filed for this visit.   Subjective Assessment - 05/16/20 1011    Subjective  Pt cooked a recipe for the first time - made beef stew and knicked his finger cuting onions.    Pertinent History Per medial records, Cody Henson is a 45 y.o. male with history of depression who was admitted on 03/16/20 with right sided weakness, facial weakness and aphasia. CT head showed evolving hypodensity in mid/posterior left frontal lobe and CTA was negative for LVO. UDS showed THC. He received tPA and follow up MRI showed patchy multifocal acute ischemic infarcts in left cerebral hemisphere without hemorrhagic  transformation.    Patient Stated Goals Pt would like to be as independent as possible, wants to drive and return to work.    Currently in Pain? No/denies            Neuromuscular Re-education Pt completed 3 sets threading x10 hollow beads onto toothpick using R hand, tip to tip pinch. Pt worked on [facility] skills grasping small 1/8" dowel and washers on Purdue pegboard, performing translatory movements moving each peg through the hand from the palm to the tip of the 2nd digit and thumb in preparation for placing them in the pegboard following a design pattern. Pt required cues to incorporate 3rd digit to manipulate orientation of 1/8" dowels, noted increased difficulty this date. Pt continues to demonstrate difficulty grasping smaller objects from flat surface - required increased time to bring washers/dowels to edge of table in order to grasp.                 OT Education - 05/16/20 1012    Education Details RUE functional use, HEP, safety with meal prep    Person(s) Educated Patient    Methods Explanation;Demonstration    Comprehension Verbalized understanding;Returned demonstration               OT Long Term Goals - 05/12/20 1215      OT LONG TERM GOAL #1   Title Pt will be independent with home  exercise program    Baseline no program at eval, 05/11/20: ongoing changes to HEP    Time 12    Period Weeks    Status On-going    Target Date 07/05/20      OT LONG TERM GOAL #2   Title Pt will complete dressing skills, UE and LE with modified independence    Baseline difficulty with socks and shoes at times, unable to tie laces.  05/11/2020: Progressing occasional difficulty with tying laces.    Time 6    Period Weeks    Status On-going    Target Date 05/24/20      OT LONG TERM GOAL #3   Title Pt will improve right grip strength by 15# to assist with cutting meat with modified independence.    Baseline Eval unable, 05/11/2020: Difficulty with cutting meat secondary to  decreased sensation however grip strength has improved dramatically.    Time 12    Period Weeks    Status Partially Met    Target Date 07/05/20      OT LONG TERM GOAL #4   Title Pt will improve right hand coordination to brush teeth without difficulty.    Baseline Eval:has to use left hand currently, 05/11/2020: Tasks still uncoordinated    Time 6    Period Weeks    Status On-going    Target Date 05/24/20      OT LONG TERM GOAL #5   Title Patient will improve typing speed greater than 12 WPM to answer emails and perform work tasks.    Baseline Eval:  adjust WPM 4.05/11/2020: Partially met is improved to 8 words a minute    Time 12    Period Weeks    Status Partially Met    Target Date 07/05/20      OT LONG TERM GOAL #6   Title Patient will improve strength in right hand to pick up 29 yo daughter from high chair.    Baseline unable at eval    Time 12    Period Weeks    Status Partially Met    Target Date 07/05/20      OT LONG TERM GOAL #7   Title Patient will improve hand function to write and sign his name on important papers with legibilty greater than 50%    Baseline Eval: 25%, poor legibility, 05/11/2020 still working on improving legibility    Time 12    Period Weeks    Status On-going    Target Date 07/05/20      OT LONG TERM GOAL #8   Title Pt will improve FOTO score to 65 or greater to show clinically relevant change to engage in ADL and IADL tasks with greater independence.    Baseline Eval 51    Time 12    Period Weeks    Status On-going    Target Date 07/05/20                 Plan - 05/16/20 1013    Clinical Impression Statement Patient requires cues for rest breaks but demonstrates improved [facility], completes Purdue pegboard x30 each 1/8" dowel and washers. Patient remains highly motivated and looks for opportunities and ways to incorporate activities from therapy into his daily exercises at home.  Continue to work towards refining hand skills for improved  independence at home and potential return to work possibilities.  Patient continues to benefit from skilled occupational therapy services to maximize safety and independence with ADL and IADL tasks.  OT Occupational Profile and History Detailed Assessment- Review of Records and additional review of physical, cognitive, psychosocial history related to current functional performance    Occupational performance deficits (Please refer to evaluation for details): ADL's;IADL's;Work;Leisure;Social Participation    Body Structure / Function / Physical Skills ADL;Dexterity;Strength;Balance;Coordination;FMC;IADL;Endurance;Sensation;UE functional use;Proprioception    Psychosocial Skills Environmental  Adaptations;Habits;Routines and Behaviors    Rehab Potential Good    Clinical Decision Making Several treatment options, min-mod task modification necessary    Comorbidities Affecting Occupational Performance: May have comorbidities impacting occupational performance    Modification or Assistance to Complete Evaluation  No modification of tasks or assist necessary to complete eval    OT Frequency 2x / week    OT Duration 12 weeks    OT Treatment/Interventions Self-care/ADL training;Cryotherapy;Therapeutic exercise;DME and/or AE instruction;Balance training;Neuromuscular education;Manual Therapy;Moist Heat;Contrast Bath;Therapeutic activities;Patient/family education    Consulted and Agree with Plan of Care Patient           Patient will benefit from skilled therapeutic intervention in order to improve the following deficits and impairments:   Body Structure / Function / Physical Skills: ADL,Dexterity,Strength,Balance,Coordination,FMC,IADL,Endurance,Sensation,UE functional use,Proprioception   Psychosocial Skills: Environmental  Adaptations,Habits,Routines and Behaviors   Visit Diagnosis: Other lack of coordination  Hemiparesis affecting dominant side as late effect of stroke [facility])    Problem  List Patient Active Problem List   Diagnosis Date Noted  . Transaminitis   . Hemiparesis affecting dominant side as late effect of stroke ([place])   . Blood pressure increase diastolic   . Left middle cerebral artery stroke ([place]) 03/23/2020  . Urinary retention   . Dyslipidemia   . Benign essential HTN   . Acute ischemic stroke ([place])   . Global aphasia   . Depression   . Hyperglycemia   . Right hemiparesis ([place])   . Aphasia   . Elevated blood pressure reading   . Stroke (cerebrum) ([place]) 03/17/2020  . Enthesopathy of hip region on both sides 01/26/2019  . Chronic right shoulder pain 12/10/2014  . Scapular dyskinesis 07/29/2014  . Bursitis of right shoulder 03/01/2014    Dessie Coma, M.S. OTR/L  05/16/20, 10:47 AM  ascom [phone]  [place] MAIN [facility] SERVICES [address] [place], [place], [address] Phone: [phone]   Fax:  [phone]  Name: Cody Henson MRN: [id number] Date of Birth: September 15, 1975

## 2020-05-17 ENCOUNTER — Encounter: Payer: Self-pay | Admitting: Family

## 2020-05-17 ENCOUNTER — Other Ambulatory Visit: Payer: Self-pay

## 2020-05-17 ENCOUNTER — Ambulatory Visit (INDEPENDENT_AMBULATORY_CARE_PROVIDER_SITE_OTHER): Payer: BC Managed Care – PPO | Admitting: Family

## 2020-05-17 VITALS — BP 126/86 | HR 60 | Ht 69.02 in | Wt 161.4 lb

## 2020-05-17 DIAGNOSIS — Z131 Encounter for screening for diabetes mellitus: Secondary | ICD-10-CM | POA: Diagnosis not present

## 2020-05-17 DIAGNOSIS — Z1329 Encounter for screening for other suspected endocrine disorder: Secondary | ICD-10-CM

## 2020-05-17 DIAGNOSIS — Z0000 Encounter for general adult medical examination without abnormal findings: Secondary | ICD-10-CM | POA: Diagnosis not present

## 2020-05-17 DIAGNOSIS — Z130 Encounter for screening for diseases of the blood and blood-forming organs and certain disorders involving the immune mechanism: Secondary | ICD-10-CM

## 2020-05-17 DIAGNOSIS — E785 Hyperlipidemia, unspecified: Secondary | ICD-10-CM

## 2020-05-17 DIAGNOSIS — Z1159 Encounter for screening for other viral diseases: Secondary | ICD-10-CM | POA: Diagnosis not present

## 2020-05-17 DIAGNOSIS — I63512 Cerebral infarction due to unspecified occlusion or stenosis of left middle cerebral artery: Secondary | ICD-10-CM | POA: Diagnosis not present

## 2020-05-17 DIAGNOSIS — Z13228 Encounter for screening for other metabolic disorders: Secondary | ICD-10-CM

## 2020-05-17 DIAGNOSIS — Z13220 Encounter for screening for lipoid disorders: Secondary | ICD-10-CM | POA: Diagnosis not present

## 2020-05-17 NOTE — Progress Notes (Signed)
Physical

## 2020-05-17 NOTE — Patient Instructions (Addendum)
Annual physical exam and labs today.   Continue Lisinopril for high blood pressure. Follow-up in 3 months or sooner if needed.  Preventive Care 73-43 Years Old, Male Preventive care refers to lifestyle choices and visits with your health care provider that can promote health and wellness. This includes:  A yearly physical exam. This is also called an annual wellness visit.  Regular dental and eye exams.  Immunizations.  Screening for certain conditions.  Healthy lifestyle choices, such as: ? Eating a healthy diet. ? Getting regular exercise. ? Not using drugs or products that contain nicotine and tobacco. ? Limiting alcohol use. What can I expect for my preventive care visit? Physical exam Your health care provider will check your:  Height and weight. These may be used to calculate your BMI (body mass index). BMI is a measurement that tells if you are at a healthy weight.  Heart rate and blood pressure.  Body temperature.  Skin for abnormal spots. Counseling Your health care provider may ask you questions about your:  Past medical problems.  Family's medical history.  Alcohol, tobacco, and drug use.  Emotional well-being.  Home life and relationship well-being.  Sexual activity.  Diet, exercise, and sleep habits.  Work and work Statistician.  Access to firearms. What immunizations do I need? Vaccines are usually given at various ages, according to a schedule. Your health care provider will recommend vaccines for you based on your age, medical history, and lifestyle or other factors, such as travel or where you work.   What tests do I need? Blood tests  Lipid and cholesterol levels. These may be checked every 5 years, or more often if you are over 16 years old.  Hepatitis C test.  Hepatitis B test. Screening  Lung cancer screening. You may have this screening every year starting at age 50 if you have a 30-pack-year history of smoking and currently smoke  or have quit within the past 15 years.  Prostate cancer screening. Recommendations will vary depending on your family history and other risks.  Genital exam to check for testicular cancer or hernias.  Colorectal cancer screening. ? All adults should have this screening starting at age 71 and continuing until age 38. ? Your health care provider may recommend screening at age 59 if you are at increased risk. ? You will have tests every 1-10 years, depending on your results and the type of screening test.  Diabetes screening. ? This is done by checking your blood sugar (glucose) after you have not eaten for a while (fasting). ? You may have this done every 1-3 years.  STD (sexually transmitted disease) testing, if you are at risk. Follow these instructions at home: Eating and drinking  Eat a diet that includes fresh fruits and vegetables, whole grains, lean protein, and low-fat dairy products.  Take vitamin and mineral supplements as recommended by your health care provider.  Do not drink alcohol if your health care provider tells you not to drink.  If you drink alcohol: ? Limit how much you have to 0-2 drinks a day. ? Be aware of how much alcohol is in your drink. In the [place] one drink equals one 12 oz bottle of beer (355 mL), one 5 oz glass of wine (148 mL), or one 1 oz glass of hard liquor (44 mL).   Lifestyle  Take daily care of your teeth and gums. Brush your teeth every morning and night with fluoride toothpaste. Floss one time each day.  Stay active. Exercise for at least 30 minutes 5 or more days each week.  Do not use any products that contain nicotine or tobacco, such as cigarettes, e-cigarettes, and chewing tobacco. If you need help quitting, ask your health care provider.  Do not use drugs.  If you are sexually active, practice safe sex. Use a condom or other form of protection to prevent STIs (sexually transmitted infections).  If told by your health care provider,  take low-dose aspirin daily starting at age 75.  Find healthy ways to cope with stress, such as: ? Meditation, yoga, or listening to music. ? Journaling. ? Talking to a trusted person. ? Spending time with friends and family. Safety  Always wear your seat belt while driving or riding in a vehicle.  Do not drive: ? If you have been drinking alcohol. Do not ride with someone who has been drinking. ? When you are tired or distracted. ? While texting.  Wear a helmet and other protective equipment during sports activities.  If you have firearms in your house, make sure you follow all gun safety procedures. What's next?  Go to your health care provider once a year for an annual wellness visit.  Ask your health care provider how often you should have your eyes and teeth checked.  Stay up to date on all vaccines. This information is not intended to replace advice given to you by your health care provider. Make sure you discuss any questions you have with your health care provider. Document Revised: 11/04/2018 Document Reviewed: 01/30/2018 Elsevier Patient Education  2021 [employer].

## 2020-05-17 NOTE — Progress Notes (Addendum)
Patient ID: Cody Henson, male    DOB: Dec 11, 1975  MRN: [id number]  CC: Annual Physical Exam  Subjective: Cody Henson is a 45 y.o. male who presents for annual physical exam. His concerns today include:   1. HYPERTENSION FOLLOW-UP: 04/11/2020: - Stable. - Continue Lisinopril as prescribed.   05/17/2020:  Currently taking: see medication list Have you taken your blood pressure medication today: [x]  Yes []  No  Med Adherence: [x]  Yes    []  No Medication side effects: []  Yes    [x]  No Adherence with salt restriction (low-salt diet): [x]  Yes    []  No Exercise: Yes [x]  physical therapy Home Monitoring?: [x]  Yes    []  No Monitoring Frequency: [x]  Yes    []  No Home BP results range: [x]  Yes, 120's/90's Smoking []  Yes [x]  No SOB? []  Yes    [x]  No Chest Pain?: []  Yes    [x]  No Leg swelling?: []  Yes    [x]  No Headaches?: []  Yes    [x]  No Dizziness? []  Yes    [x]  No Comments: Requesting special labs today homocysteine, NMR lipoprofile, and lipoprotein related to history of stroke. Still followed by Neurology and Cardiology.   Patient Active Problem List   Diagnosis Date Noted  . Transaminitis   . Hemiparesis affecting dominant side as late effect of stroke ([place])   . Blood pressure increase diastolic   . 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 Urinary retention   . Dyslipidemia   . Benign essential HTN   . Acute ischemic stroke ([place])   . Global aphasia   . Depression   . Hyperglycemia   . Right hemiparesis ([place])   . Aphasia   . Elevated blood pressure reading   . Stroke (cerebrum) ([place]) 03/17/2020  . Enthesopathy of hip region on both sides 01/26/2019  . Chronic right shoulder pain 12/10/2014  . Scapular dyskinesis 07/29/2014  . Bursitis of right shoulder 03/01/2014     Current Outpatient Medications on File Prior to Visit  Medication Sig Dispense Refill  . aspirin EC 81 MG tablet Take 1 tablet (81 mg total) by mouth daily. Swallow whole. 150 tablet 2  .  atorvastatin (LIPITOR) 80 MG tablet Take 1 tablet (80 mg total) by mouth daily. 30 tablet 1  . lisinopril (ZESTRIL) 10 MG tablet Take 1 tablet (10 mg total) by mouth daily. 30 tablet 0  . Multiple Vitamin (MULTIVITAMIN) tablet Take 1 tablet by mouth daily.    . sertraline (ZOLOFT) 25 MG tablet Take 25 mg by mouth daily.    Marland Kitchen vortioxetine HBr (TRINTELLIX) 10 MG TABS tablet      No current facility-administered medications on file prior to visit.    Allergies  Allergen Reactions  . Albumen, Egg Itching  . Other Hives    Chicken meat, [place]  . Eggs Or Egg-Derived Products Itching    Social History   Socioeconomic History  . Marital status: Married    Spouse name: Not on file  . Number of children: Not on file  . Years of education: Not on file  . Highest education level: Not on file  Occupational History  . Not on file  Tobacco Use  . Smoking status: Never Smoker  . Smokeless tobacco: Never Used  Substance and Sexual Activity  . Alcohol use: Not on file  . Drug use: Not on file  . Sexual activity: Not on file  Other Topics Concern  . Not on file  Social History  Narrative  . Not on file   Social Determinants of Health   Financial Resource Strain: Not on file  Food Insecurity: Not on file  Transportation Needs: Not on file  Physical Activity: Not on file  Stress: Not on file  Social Connections: Not on file  Intimate Partner Violence: Not on file    Family History  Problem Relation Age of Onset  . Stroke Paternal Grandfather   . Stroke Maternal Aunt     Past Surgical History:  Procedure Laterality Date  . BUBBLE STUDY  03/21/2020   Procedure: BUBBLE STUDY;  Surgeon: [name], MD;  Location: [place];  Service: Cardiology;;  . TEE WITHOUT CARDIOVERSION N/A 03/21/2020   Procedure: TRANSESOPHAGEAL ECHOCARDIOGRAM (TEE);  Surgeon: [name], MD;  Location: [facility];  Service: Cardiology;  Laterality: N/A;    ROS: Review of  Systems Negative except as stated above  PHYSICAL EXAM: BP 126/86 (BP Location: Left Arm, Patient Position: Sitting)   Pulse 60   Ht 5' 9.02" (1.753 m)   Wt 161 lb 6.4 oz (73.2 kg)   SpO2 98%   BMI 23.82 kg/m   Physical Exam Constitutional:      Appearance: He is normal weight.  HENT:     Head: Normocephalic and atraumatic.     Right Ear: Tympanic membrane, ear canal and external ear normal.     Left Ear: Tympanic membrane, ear canal and external ear normal.     Nose: Nose normal.     Mouth/Throat:     Mouth: Mucous membranes are moist.     Pharynx: Oropharynx is clear.  Eyes:     Extraocular Movements: Extraocular movements intact.     Conjunctiva/sclera: Conjunctivae normal.     Pupils: Pupils are equal, round, and reactive to light.  Cardiovascular:     Rate and Rhythm: Normal rate and regular rhythm.     Pulses: Normal pulses.     Heart sounds: Normal heart sounds.  Pulmonary:     Effort: Pulmonary effort is normal.     Breath sounds: Normal breath sounds.  Abdominal:     General: Abdomen is flat. Bowel sounds are normal.     Palpations: Abdomen is soft.  Genitourinary:    Comments: Patient declined examination. Musculoskeletal:        General: Normal range of motion.     Cervical back: Normal range of motion and neck supple.  Skin:    General: Skin is warm and dry.     Capillary Refill: Capillary refill takes less than 2 seconds.  Neurological:     Mental Status: He is alert.     Motor: Weakness present.     Comments: Right upper extremity and right lower extremity weakness.   Psychiatric:        Mood and Affect: Mood normal.        Behavior: Behavior normal.     ASSESSMENT AND PLAN: 1. Annual physical exam: - Counseled on 150 minutes of exercise per week as tolerated, healthy eating (including decreased daily intake of saturated fats, cholesterol, added sugars, sodium), STI prevention, and routine healthcare maintenance.  2. Screening for metabolic  disorder: - CMP last obtained 03/28/2020. - Hepatic function panel to screen liver function. - Hepatic Function Panel  3. Screening for deficiency anemia: - CBC last obtained 04/04/2020.  4. Diabetes mellitus screening: - Hemoglobin A1c last obtained 03/17/2020.  5. Thyroid disorder screen: - TSH to check thyroid function.  - TSH+T4F+T3Free  6. Need  for hepatitis B screening test: - Viral hepatitis to screen for hepatitis B. - Viral Hepatitis HBV, HCV  7. Need for hepatitis C screening test: - Viral hepatitis to screen for hepatitis C.  - Viral Hepatitis HBV, HCV  8. Dyslipidemia: - Lipid panel to screen for high cholesterol.  - Lipoprotein and NMR lipoprotein per patient request. - Lipid panel - Lipoprotein A (LPA) - NMR, lipoprofile  9. Left middle cerebral artery stroke [facility]): - Homocysteine per patient request. - Homocysteine   Patient was given the opportunity to ask questions.  Patient verbalized understanding of the plan and was able to repeat key elements of the plan. Patient was given clear instructions to go to Emergency Department or return to medical center if symptoms don't improve, worsen, or new problems develop.The patient verbalized understanding.   Orders Placed This Encounter  Procedures  . Viral Hepatitis HBV, HCV  . Hepatic Function Panel  . Lipid panel  . TSH+T4F+T3Free  . Lipoprotein A (LPA)  . NMR, lipoprofile  . Homocysteine     Requested Prescriptions    No prescriptions requested or ordered in this encounter    Return in about 3 months (around 08/17/2020) for Follow-Up hypertension.  Camillia Herter, NP

## 2020-05-18 ENCOUNTER — Ambulatory Visit: Payer: BC Managed Care – PPO

## 2020-05-18 ENCOUNTER — Ambulatory Visit: Payer: BC Managed Care – PPO | Admitting: Occupational Therapy

## 2020-05-18 ENCOUNTER — Encounter: Payer: BC Managed Care – PPO | Admitting: Speech Pathology

## 2020-05-18 ENCOUNTER — Encounter: Payer: Self-pay | Admitting: Occupational Therapy

## 2020-05-18 ENCOUNTER — Encounter: Payer: Self-pay | Admitting: Cardiovascular Disease

## 2020-05-18 DIAGNOSIS — R269 Unspecified abnormalities of gait and mobility: Secondary | ICD-10-CM

## 2020-05-18 DIAGNOSIS — R278 Other lack of coordination: Secondary | ICD-10-CM

## 2020-05-18 DIAGNOSIS — I63512 Cerebral infarction due to unspecified occlusion or stenosis of left middle cerebral artery: Secondary | ICD-10-CM | POA: Diagnosis not present

## 2020-05-18 DIAGNOSIS — R2681 Unsteadiness on feet: Secondary | ICD-10-CM | POA: Diagnosis not present

## 2020-05-18 DIAGNOSIS — I69359 Hemiplegia and hemiparesis following cerebral infarction affecting unspecified side: Secondary | ICD-10-CM

## 2020-05-18 DIAGNOSIS — R4701 Aphasia: Secondary | ICD-10-CM | POA: Diagnosis not present

## 2020-05-18 DIAGNOSIS — M6281 Muscle weakness (generalized): Secondary | ICD-10-CM

## 2020-05-18 DIAGNOSIS — R482 Apraxia: Secondary | ICD-10-CM | POA: Diagnosis not present

## 2020-05-18 LAB — NMR, LIPOPROFILE
Cholesterol, Total: 160 mg/dL (ref 100–199)
HDL Particle Number: 24.1 umol/L — ABNORMAL LOW (ref >=30.5)
HDL-C: 31 mg/dL — ABNORMAL LOW (ref >39)
LDL Particle Number: 1356 nmol/L — ABNORMAL HIGH (ref <1000)
LDL Size: 20.3 nm — ABNORMAL LOW (ref >20.5)
LDL-C (NIH Calc): 100 mg/dL — ABNORMAL HIGH (ref 0–99)
LP-IR Score: 75 — ABNORMAL HIGH (ref <=45)
Small LDL Particle Number: 789 nmol/L — ABNORMAL HIGH (ref <=527)
Triglycerides: 167 mg/dL — ABNORMAL HIGH (ref 0–149)

## 2020-05-18 LAB — HEPATIC FUNCTION PANEL
ALT: 41 IU/L (ref 0–44)
AST: 26 IU/L (ref 0–40)
Albumin: 5.3 g/dL — ABNORMAL HIGH (ref 4.0–5.0)
Alkaline Phosphatase: 83 IU/L (ref 44–121)
Bilirubin Total: 0.6 mg/dL (ref 0.0–1.2)
Bilirubin, Direct: 0.13 mg/dL (ref 0.00–0.40)
Total Protein: 8 g/dL (ref 6.0–8.5)

## 2020-05-18 LAB — LIPID PANEL
Chol/HDL Ratio: 4.9 ratio (ref 0.0–5.0)
Cholesterol, Total: 170 mg/dL (ref 100–199)
HDL: 35 mg/dL — ABNORMAL LOW (ref >39)
LDL Chol Calc (NIH): 105 mg/dL — ABNORMAL HIGH (ref 0–99)
Triglycerides: 169 mg/dL — ABNORMAL HIGH (ref 0–149)
VLDL Cholesterol Cal: 30 mg/dL (ref 5–40)

## 2020-05-18 LAB — HCV INTERPRETATION

## 2020-05-18 LAB — TSH+T4F+T3FREE
Free T4: 1.29 ng/dL (ref 0.82–1.77)
T3, Free: 3.7 pg/mL (ref 2.0–4.4)
TSH: 1.62 u[IU]/mL (ref 0.450–4.500)

## 2020-05-18 LAB — VIRAL HEPATITIS HBV, HCV
HCV Ab: <0.1 s/co ratio (ref 0.0–0.9)
Hep B Core Total Ab: NEGATIVE
Hep B Surface Ab, Qual: REACTIVE
Hepatitis B Surface Ag: NEGATIVE

## 2020-05-18 LAB — LIPOPROTEIN A (LPA): Lipoprotein (a): 156.4 nmol/L — ABNORMAL HIGH (ref <75.0)

## 2020-05-18 LAB — HOMOCYSTEINE: Homocysteine: 7.2 umol/L (ref 0.0–14.5)

## 2020-05-18 NOTE — Therapy (Signed)
[place] MAIN [facility] SERVICES [address] [place], [place], [address] Phone: [phone]   Fax:  [phone]  Physical Therapy Treatment  Patient Details  Name: Cody Henson MRN: [id number] Date of Birth: 06-29-1975 No data recorded  Encounter Date: 05/18/2020   PT End of Session - 05/18/20 0900    Visit Number 11    Number of Visits 25    Date for PT Re-Evaluation 07/05/20    PT Start Time 0900    PT Stop Time 0945    PT Time Calculation (min) 45 min    Equipment Utilized During Treatment Gait belt    Activity Tolerance Patient tolerated treatment well    Behavior During Therapy WFL for tasks assessed/performed           Past Medical History:  Diagnosis Date  . Allergy    Phreesia 04/10/2020  . Depressed   . Hyperlipidemia    Phreesia 04/10/2020  . Hypertension    Phreesia 04/10/2020  . Neuromuscular disorder ([place])    Phreesia 04/10/2020  . Stroke [facility])    Phreesia 04/10/2020    Past Surgical History:  Procedure Laterality Date  . BUBBLE STUDY  03/21/2020   Procedure: BUBBLE STUDY;  Surgeon: [name], MD;  Location: [place];  Service: Cardiology;;  . TEE WITHOUT CARDIOVERSION N/A 03/21/2020   Procedure: TRANSESOPHAGEAL ECHOCARDIOGRAM (TEE);  Surgeon: [name], MD;  Location: [place];  Service: Cardiology;  Laterality: N/A;    There were no vitals filed for this visit.   Subjective Assessment - 05/18/20 0859    Subjective Patient reports no changes since last visit. States he is doing well at home.    Pertinent History HTN    Limitations Walking;Standing    How long can you sit comfortably? unrestricted    How long can you stand comfortably? 15 mins    How long can you walk comfortably? 1 hour no AD    Patient Stated Goals be able to drive at type    Currently in Pain? No/denies                 Intervention:   Neuromuscular  Re-education:  Standing on a mini-tramp performing  ball toss with the following activities: Feet wide x 5 tosses Feet together x 5 tosses Feet staggered x 5 tosses (each LE) Feet Tandem x 10 tosses (each LE)- Mild unsteadiness yet able to maintain balance Single leg stance x 10 tosses (each LE) - Increased sway and unsteadiness but able to remain in position.    Clock-  Dynamic stepping (single leg standing) - PT called out "hours on clock- ex: 3 o'clock and patient would single leg stand and place opp LE on floor at desired location) x 1 min. Patient able to coordinate well with minimal delay and no loss of balance.   Standing - Single leg squat to pick up cone off floor  (Right then left) and then 2 lunges (one for each leg) forward to next cone (total of 8 single leg squats each leg) and 10 lunges each.   Gym equipment instruction:  Patient performed seated ham curl - Level 3 - 2 sets of 10 reps Patient performed seated knee ext- Level 4- 2 sets of 10 reps Patient performed seated Leg press- Level 4  2 sets of 10 reps Patient performed seated calf press- Level 4 2 sets of 10 reps Patient performed 5 min of elliptical machine with VC and visual demo  to perform correctly.  Education provided throughout session via VC/TC and demonstration to facilitate movement at target joints and correct muscle activation for all testing and exercises performed.   Clinical Impression: Patient performed well overall with higher level balance without significant difficulty- able to progress to various activities on mini- tramp without loss of balance. He was also educated in gym based equipment  And patient performed well with VC and demonstration with appropriate fatigue.  He will benefit from continued skilled PT services to progress his higher level balance and return to previous level of function.                  PT Education - 05/18/20 0950    Education Details Educated in higher level balance activities and gym based equipment     Person(s) Educated Patient    Methods Explanation;Demonstration;Tactile cues;Verbal cues;Handout    Comprehension Verbalized understanding;Returned demonstration;Verbal cues required;Tactile cues required;Need further instruction            PT Short Term Goals - 05/11/20 1642      PT SHORT TERM GOAL #1   Title Patient will be independent in home exercise program to improve strength/mobility for better functional independence with ADLs.    Baseline 05/11/2020. Patient presents with good working knowledge of home program but will continue to engage in progressive Home program desinged for strength/balance.    Status New    Target Date 07/05/20             PT Long Term Goals - 05/11/20 0937      PT LONG TERM GOAL #1   Title Patient will increase FOTO score to equal to or greater than 72% to demonstrate statistically significant improvement in mobility and quality of life.    Baseline 04/12/20 56%, 05/11/2020= 84%    Status Achieved      PT LONG TERM GOAL #2   Title Patient will increase Functional Gait Assessment score to >26/30 as to reduce fall risk and improve dynamic gait safety with community ambulation.    Baseline 04/12/20 FGA 22/30; 05/11/2020 FGA= 28/30    Time 0    Status On-going      PT LONG TERM GOAL #3   Title Patient will be able to navigate full flight of stairs independently with reciprocal pattern  to get to next level of home.    Baseline 04/12/20  step to pattern bilateral UE support. 05/11/2020- Patient able to demo independence with stair- recirpocal and no UE support for >15 steps.    Status Achieved    Target Date 07/05/20                 Plan - 05/18/20 0951    Clinical Impression Statement Patient performed well overall with higher level balance without significant difficulty- able to progress to various activities on mini- tramp without loss of balance. He was also educated in gym based equipment  And patient performed well with VC and demonstration  with appropriate fatigue.  He will benefit from continued skilled PT services to progress his higher level balance and return to previous level of function.    Personal Factors and Comorbidities Comorbidity 2    Comorbidities HTN, depression    Examination-Activity Limitations Locomotion Level;Stairs    Examination-Participation Restrictions Yard Work;Driving;Community Activity    Stability/Clinical Decision Making Evolving/Moderate complexity    Rehab Potential Good    PT Frequency 2x / week    PT Duration 12 weeks    PT  Treatment/Interventions ADLs/Self Care Home Management;Gait training;Stair training;Functional mobility training;Therapeutic activities;Therapeutic exercise;Balance training;Neuromuscular re-education;Patient/family education;Passive range of motion    PT Next Visit Plan Continue with progressive balance activities and resistive exercises    PT Home Exercise Plan no changes.    Consulted and Agree with Plan of Care Patient           Patient will benefit from skilled therapeutic intervention in order to improve the following deficits and impairments:  Abnormal gait,Decreased balance,Decreased endurance,Decreased mobility,Difficulty walking,Decreased range of motion,Improper body mechanics,Decreased activity tolerance,Decreased strength,Impaired flexibility,Postural dysfunction  Visit Diagnosis: Other lack of coordination  Hemiparesis affecting dominant side as late effect of stroke ([facility])  Muscle weakness (generalized)  Unsteadiness on feet  Abnormality of gait and mobility     Problem List Patient Active Problem List   Diagnosis Date Noted  . Transaminitis   . Hemiparesis affecting dominant side as late effect of stroke ([place])   . Blood pressure increase diastolic   . 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 Urinary retention   . Dyslipidemia   . Benign essential HTN   . Acute ischemic stroke ([place])   . Global aphasia   . Depression   .  Hyperglycemia   . Right hemiparesis ([place])   . Aphasia   . Elevated blood pressure reading   . Stroke (cerebrum) ([place]) 03/17/2020  . Enthesopathy of hip region on both sides 01/26/2019  . Chronic right shoulder pain 12/10/2014  . Scapular dyskinesis 07/29/2014  . Bursitis of right shoulder 03/01/2014    Lewis Moccasin, PT 05/18/2020, 10:14 AM  [place] MAIN [facility] SERVICES [address] [place], [place], [address] Phone: [phone]   Fax:  [phone]  Name: Cody Henson MRN: [id number] Date of Birth: 1976/01/13

## 2020-05-18 NOTE — Therapy (Signed)
[place] MAIN [facility] SERVICES [address] [place], [place], [address] Phone: [phone]   Fax:  [phone]  Occupational Therapy Treatment  Patient Details  Name: Cody Henson MRN: [id number] Date of Birth: 19-May-1975 No data recorded  Encounter Date: 05/18/2020   OT End of Session - 05/18/20 1054    Visit Number 12    Number of Visits 24    Date for OT Re-Evaluation 07/05/20    OT Start Time 1017    OT Stop Time 1100    OT Time Calculation (min) 43 min    Activity Tolerance Patient tolerated treatment well    Behavior During Therapy [facility] for tasks assessed/performed           Past Medical History:  Diagnosis Date  . Allergy    Phreesia 04/10/2020  . Depressed   . Hyperlipidemia    Phreesia 04/10/2020  . Hypertension    Phreesia 04/10/2020  . Neuromuscular disorder ([place])    Phreesia 04/10/2020  . Stroke [facility])    Phreesia 04/10/2020    Past Surgical History:  Procedure Laterality Date  . BUBBLE STUDY  03/21/2020   Procedure: BUBBLE STUDY;  Surgeon: [name], MD;  Location: [place];  Service: Cardiology;;  . TEE WITHOUT CARDIOVERSION N/A 03/21/2020   Procedure: TRANSESOPHAGEAL ECHOCARDIOGRAM (TEE);  Surgeon: [name], MD;  Location: [place];  Service: Cardiology;  Laterality: N/A;    There were no vitals filed for this visit.   Subjective Assessment - 05/18/20 1051    Subjective  Pt reports he fertilized the yard this week and has not attempted any further cooking with knives.    Pertinent History Per medial records, Cody Henson is a 45 y.o. male with history of depression who was admitted on 03/16/20 with right sided weakness, facial weakness and aphasia. CT head showed evolving hypodensity in mid/posterior left frontal lobe and CTA was negative for LVO. UDS showed THC. He received tPA and follow up MRI showed patchy multifocal acute ischemic infarcts in left cerebral hemisphere without hemorrhagic  transformation.    Patient Stated Goals Pt would like to be as independent as possible, wants to drive and return to work.    Currently in Pain? No/denies           Neuromuscular Re-education Patient requires increased time to thread thick string through 2 inch and 1 inch buttons utilizing L hand to hold button and R hand to thread. Pt utilizes scissors twice to cut string, requires L hand assist to place hands appropriately in loops 2/2 motor apraxia. Pt completed grooved pegboard and tweezer dexterity x6 pegs each, requires increased time and WBing rest breaks as pt fatigues. Pt continues to utilize R shoulder/wrist for positioning small pegs. Pt completed fisherman's knott utilizing thick string with MIN A from L hand to stabilize and increased time to complete.           OT Education - 05/18/20 1053    Education Details RUE functional use, HEP, [place] home exercises    Person(s) Educated Patient    Methods Explanation;Demonstration    Comprehension Verbalized understanding;Returned demonstration               OT Long Term Goals - 05/12/20 1215      OT LONG TERM GOAL #1   Title Pt will be independent with home exercise program    Baseline no program at eval, 05/11/20: ongoing changes to HEP    Time 12  Period Weeks    Status On-going    Target Date 07/05/20      OT LONG TERM GOAL #2   Title Pt will complete dressing skills, UE and LE with modified independence    Baseline difficulty with socks and shoes at times, unable to tie laces.  05/11/2020: Progressing occasional difficulty with tying laces.    Time 6    Period Weeks    Status On-going    Target Date 05/24/20      OT LONG TERM GOAL #3   Title Pt will improve right grip strength by 15# to assist with cutting meat with modified independence.    Baseline Eval unable, 05/11/2020: Difficulty with cutting meat secondary to decreased sensation however grip strength has improved dramatically.    Time 12    Period  Weeks    Status Partially Met    Target Date 07/05/20      OT LONG TERM GOAL #4   Title Pt will improve right hand coordination to brush teeth without difficulty.    Baseline Eval:has to use left hand currently, 05/11/2020: Tasks still uncoordinated    Time 6    Period Weeks    Status On-going    Target Date 05/24/20      OT LONG TERM GOAL #5   Title Patient will improve typing speed greater than 12 WPM to answer emails and perform work tasks.    Baseline Eval:  adjust WPM 4.05/11/2020: Partially met is improved to 8 words a minute    Time 12    Period Weeks    Status Partially Met    Target Date 07/05/20      OT LONG TERM GOAL #6   Title Patient will improve strength in right hand to pick up 7 yo daughter from high chair.    Baseline unable at eval    Time 12    Period Weeks    Status Partially Met    Target Date 07/05/20      OT LONG TERM GOAL #7   Title Patient will improve hand function to write and sign his name on important papers with legibilty greater than 50%    Baseline Eval: 25%, poor legibility, 05/11/2020 still working on improving legibility    Time 12    Period Weeks    Status On-going    Target Date 07/05/20      OT LONG TERM GOAL #8   Title Pt will improve FOTO score to 65 or greater to show clinically relevant change to engage in ADL and IADL tasks with greater independence.    Baseline Eval 51    Time 12    Period Weeks    Status On-going    Target Date 07/05/20                 Plan - 05/18/20 1055    Clinical Impression Statement Patient requires increased time to thread thick string through 2 inch and 1 inch buttons utilizing L hand to hold button and R hand to thread. Pt utilizes scissors twice to cut string, rewuires L hand assist to place hands appropriately in loops 2/2 motor apraxia. Pt copmlted grooved pegboard and tweezer dexterity for sustained pinch. Pt continues to utilize R shoulder/wrist for positioning small pegs. Patient remains  highly motivated and looks for opportunities and ways to incorporate activities from therapy into his daily exercises at home.  Continue to work towards refining hand skills for improved independence at home and potential  return to work possibilities.  Patient continues to benefit from skilled occupational therapy services to maximize safety and independence with ADL and IADL tasks.    OT Occupational Profile and History Detailed Assessment- Review of Records and additional review of physical, cognitive, psychosocial history related to current functional performance    Occupational performance deficits (Please refer to evaluation for details): ADL's;IADL's;Work;Leisure;Social Participation    Body Structure / Function / Physical Skills ADL;Dexterity;Strength;Balance;Coordination;FMC;IADL;Endurance;Sensation;UE functional use;Proprioception    Psychosocial Skills Environmental  Adaptations;Habits;Routines and Behaviors    Rehab Potential Good    Clinical Decision Making Several treatment options, min-mod task modification necessary    Comorbidities Affecting Occupational Performance: May have comorbidities impacting occupational performance    Modification or Assistance to Complete Evaluation  No modification of tasks or assist necessary to complete eval    OT Frequency 2x / week    OT Duration 12 weeks    OT Treatment/Interventions Self-care/ADL training;Cryotherapy;Therapeutic exercise;DME and/or AE instruction;Balance training;Neuromuscular education;Manual Therapy;Moist Heat;Contrast Bath;Therapeutic activities;Patient/family education    Consulted and Agree with Plan of Care Patient           Patient will benefit from skilled therapeutic intervention in order to improve the following deficits and impairments:   Body Structure / Function / Physical Skills: ADL,Dexterity,Strength,Balance,Coordination,FMC,IADL,Endurance,Sensation,UE functional use,Proprioception   Psychosocial Skills:  Environmental  Adaptations,Habits,Routines and Behaviors   Visit Diagnosis: Other lack of coordination  Muscle weakness (generalized)  Hemiparesis affecting dominant side as late effect of stroke [facility])    Problem List Patient Active Problem List   Diagnosis Date Noted  . Transaminitis   . Hemiparesis affecting dominant side as late effect of stroke ([place])   . Blood pressure increase diastolic   . 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 Urinary retention   . Dyslipidemia   . Benign essential HTN   . Acute ischemic stroke ([place])   . Global aphasia   . Depression   . Hyperglycemia   . Right hemiparesis ([place])   . Aphasia   . Elevated blood pressure reading   . Stroke (cerebrum) ([place]) 03/17/2020  . Enthesopathy of hip region on both sides 01/26/2019  . Chronic right shoulder pain 12/10/2014  . Scapular dyskinesis 07/29/2014  . Bursitis of right shoulder 03/01/2014    Dessie Coma, M.S. OTR/L  05/18/20, 11:09 AM  ascom [phone]  [place] MAIN [facility] SERVICES [address] [place], [place], [address] Phone: [phone]   Fax:  [phone]  Name: Cody Henson MRN: [id number] Date of Birth: 15-Jun-1975

## 2020-05-18 NOTE — Progress Notes (Signed)
Thyroid normal.   Liver function normal.   Hepatitis B negative.   Hepatitis C negative.   Cholesterol higher than expected. High cholesterol may increase risk of heart attack and/or stroke. Consider eating more fruits, vegetables, and lean baked meats such as chicken or fish. Moderate intensity exercise at least 150 minutes as tolerated per week may help as well.   Continue Atorvastatin as prescribed for high cholesterol.   Special labs per patient request: - Homocysteine normal.  - Lipoprotein A higher than expected. - NMR lipoprofile with significant elevations . - Patient encouraged to follow-up with his Cardiologist and Neurologist for next steps.

## 2020-05-19 ENCOUNTER — Other Ambulatory Visit: Payer: Self-pay

## 2020-05-19 ENCOUNTER — Ambulatory Visit (INDEPENDENT_AMBULATORY_CARE_PROVIDER_SITE_OTHER): Payer: BC Managed Care – PPO | Admitting: Otolaryngology

## 2020-05-19 ENCOUNTER — Encounter (INDEPENDENT_AMBULATORY_CARE_PROVIDER_SITE_OTHER): Payer: Self-pay | Admitting: Otolaryngology

## 2020-05-19 VITALS — Temp 96.6°F

## 2020-05-19 DIAGNOSIS — Z8673 Personal history of transient ischemic attack (TIA), and cerebral infarction without residual deficits: Secondary | ICD-10-CM | POA: Diagnosis not present

## 2020-05-19 DIAGNOSIS — H93291 Other abnormal auditory perceptions, right ear: Secondary | ICD-10-CM | POA: Diagnosis not present

## 2020-05-19 NOTE — Progress Notes (Signed)
HPI: Cody Henson is a 45 y.o. male who presents is referred by NP for rehab Allyson Sabal for evaluation of hearing loss in the right ear since he had a stroke 2 months ago secondary to a patent ductus ovale.  He is presently undergoing rehab because of extremity weakness.  But ever since he had a stroke he does not feel like he hears as well from his right ear.  He is referred here for audiologic testing..  Past Medical History:  Diagnosis Date  . Allergy    Phreesia 04/10/2020  . Depressed   . Hyperlipidemia    Phreesia 04/10/2020  . Hypertension    Phreesia 04/10/2020  . Neuromuscular disorder ([place])    Phreesia 04/10/2020  . Stroke [facility])    Phreesia 04/10/2020   Past Surgical History:  Procedure Laterality Date  . BUBBLE STUDY  03/21/2020   Procedure: BUBBLE STUDY;  Surgeon: [name], MD;  Location: [place];  Service: Cardiology;;  . TEE WITHOUT CARDIOVERSION N/A 03/21/2020   Procedure: TRANSESOPHAGEAL ECHOCARDIOGRAM (TEE);  Surgeon: [name], MD;  Location: [place];  Service: Cardiology;  Laterality: N/A;   Social History   Socioeconomic History  . Marital status: Married    Spouse name: Not on file  . Number of children: Not on file  . Years of education: Not on file  . Highest education level: Not on file  Occupational History  . Not on file  Tobacco Use  . Smoking status: Never Smoker  . Smokeless tobacco: Never Used  Substance and Sexual Activity  . Alcohol use: Not on file  . Drug use: Not on file  . Sexual activity: Not on file  Other Topics Concern  . Not on file  Social History Narrative  . Not on file   Social Determinants of Health   Financial Resource Strain: Not on file  Food Insecurity: Not on file  Transportation Needs: Not on file  Physical Activity: Not on file  Stress: Not on file  Social Connections: Not on file   Family History  Problem Relation Age of Onset  . Stroke Paternal Grandfather   . Stroke  Maternal Aunt    Allergies  Allergen Reactions  . Albumen, Egg Itching  . Other Hives    Chicken meat, [place]  . Eggs Or Egg-Derived Products Itching   Prior to Admission medications   Medication Sig Start Date End Date Taking? Authorizing Provider  aspirin EC 81 MG tablet Take 1 tablet (81 mg total) by mouth daily. Swallow whole. 03/23/20 03/23/21  Bailey-Modzik, Mayo Ao A, NP  atorvastatin (LIPITOR) 80 MG tablet Take 1 tablet (80 mg total) by mouth daily. 04/06/20   Love, Ivan Anchors, PA-C  lisinopril (ZESTRIL) 10 MG tablet Take 1 tablet (10 mg total) by mouth daily. 04/14/20   Camillia Herter, NP  Multiple Vitamin (MULTIVITAMIN) tablet Take 1 tablet by mouth daily.    [provider]  sertraline (ZOLOFT) 25 MG tablet Take 25 mg by mouth daily.    [provider]  vortioxetine HBr (TRINTELLIX) 10 MG TABS tablet  04/27/20   [provider]     Positive ROS: Otherwise negative  All other systems have been reviewed and were otherwise negative with the exception of those mentioned in the HPI and as above.  Physical Exam: Constitutional: Alert, well-appearing, no acute distress Ears: External ears without lesions or tenderness. Ear canals are clear bilaterally with minimal wax buildup.  TMs are clear bilaterally.  On  tuning fork testing he is hearing seemed reasonably good. Nasal: External nose without lesions. Septum with mild deformity.. Clear nasal passages Oral: Lips and gums without lesions. Tongue and palate mucosa without lesions. Posterior oropharynx clear. Neck: No palpable adenopathy or masses Respiratory: Breathing comfortably  Skin: No facial/neck lesions or rash noted.  Audiogram in the office today demonstrated normal hearing in both ears with SRT's of 0 and 5 dB with 100% word discrimination.  Procedures  Assessment:3: Normal hearing evaluation with no evidence of hearing loss and excellent word discrimination.  Plan: Reassured patient of normal  hearing evaluation. Since the stroke involved mostly function of his right side of his body I suspect this may be the reason he feels like he does not hear well on the right side but on audiologic testing he has normal hearing and symmetric hearing in both ears.   Radene Journey, MD   CC:

## 2020-05-19 NOTE — Progress Notes (Signed)
I agree with the above plan 

## 2020-05-23 ENCOUNTER — Ambulatory Visit: Payer: BC Managed Care – PPO

## 2020-05-23 ENCOUNTER — Ambulatory Visit: Payer: BC Managed Care – PPO | Attending: Physical Medicine and Rehabilitation | Admitting: Speech Pathology

## 2020-05-23 ENCOUNTER — Other Ambulatory Visit: Payer: Self-pay

## 2020-05-23 DIAGNOSIS — R4701 Aphasia: Secondary | ICD-10-CM | POA: Diagnosis not present

## 2020-05-23 DIAGNOSIS — R2681 Unsteadiness on feet: Secondary | ICD-10-CM | POA: Diagnosis not present

## 2020-05-23 DIAGNOSIS — R269 Unspecified abnormalities of gait and mobility: Secondary | ICD-10-CM | POA: Diagnosis not present

## 2020-05-23 DIAGNOSIS — R262 Difficulty in walking, not elsewhere classified: Secondary | ICD-10-CM | POA: Insufficient documentation

## 2020-05-23 DIAGNOSIS — M6281 Muscle weakness (generalized): Secondary | ICD-10-CM | POA: Insufficient documentation

## 2020-05-23 DIAGNOSIS — I63512 Cerebral infarction due to unspecified occlusion or stenosis of left middle cerebral artery: Secondary | ICD-10-CM | POA: Diagnosis not present

## 2020-05-23 DIAGNOSIS — R482 Apraxia: Secondary | ICD-10-CM

## 2020-05-23 DIAGNOSIS — R278 Other lack of coordination: Secondary | ICD-10-CM | POA: Diagnosis not present

## 2020-05-23 NOTE — Patient Instructions (Signed)
Take reflective pauses when fatigued to gather thoughts

## 2020-05-23 NOTE — Therapy (Signed)
[place] MAIN [facility] SERVICES [address] [place], [place], [address] Phone: [phone]   Fax:  [phone]  Speech Language Pathology Treatment DISCHARGE SUMMARY  Patient Details  Name: Cody Henson MRN: [id number] Date of Birth: [birth year] Referring Provider (SLP): Reesa Chew, [place]   Encounter Date: 05/23/2020   End of Session - 05/23/20 0920    Visit Number 9    Number of Visits 25    Date for SLP Re-Evaluation 07/05/20    Authorization Type [employer]    Authorization Time Period 04/12/2020 thru 07/05/2020    Authorization - Visit Number 9    Progress Note Due on Visit 10    SLP Start Time 0900    SLP Stop Time  1000    SLP Time Calculation (min) 60 min    Activity Tolerance Patient tolerated treatment well           Past Medical History:  Diagnosis Date  . Allergy    Phreesia 04/10/2020  . Depressed   . Hyperlipidemia    Phreesia 04/10/2020  . Hypertension    Phreesia 04/10/2020  . Neuromuscular disorder ([place])    Phreesia 04/10/2020  . Stroke [facility])    Phreesia 04/10/2020    Past Surgical History:  Procedure Laterality Date  . BUBBLE STUDY  03/21/2020   Procedure: BUBBLE STUDY;  Surgeon: [name], MD;  Location: [place];  Service: Cardiology;;  . TEE WITHOUT CARDIOVERSION N/A 03/21/2020   Procedure: TRANSESOPHAGEAL ECHOCARDIOGRAM (TEE);  Surgeon: [name], MD;  Location: [place];  Service: Cardiology;  Laterality: N/A;    There were no vitals filed for this visit.   Subjective Assessment - 05/23/20 0918    Subjective Pt pleasant, made decision to have cardiac procedure    Currently in Pain? No/denies          Neuro   ST   TX   NOTE          Treatment Data and Patient's Response to Treatment   Skilled treatment session focused on completing education with pt. Pt reports that he consulted with his wife and they both only see increases in his word finding deficits when pt is  very tired. In collaboration with pt, SLP offered several strategies to promote increased word finding abilities during moments of fatigue. All goals were reviewed with pt and at this time, all education has been completed and pt is appropriate for discharge from skilled ST intervention.          SLP Education - 05/23/20 0919    Education Details ways to increase language abilities when fatigued    Person(s) Educated Patient    Methods Explanation;Demonstration;Verbal cues    Comprehension Verbalized understanding;Returned demonstration            SLP Short Term Goals - 05/23/20 0921      SLP SHORT TERM GOAL #1   Title Pt will utilize speech intelligibility strategies to produce speech intelligibility to >95% at the complex sentence level.    Status Achieved      SLP SHORT TERM GOAL #2   Title With moderate cues, pt will utilize word finding strategies during a description task to effective produce 3 descriptive sentences with >75%.    Status Achieved      SLP SHORT TERM GOAL #3   Title With moderate cues, pt will increase reading comprehension by answering [facility] questions after reading semi-complex information with 50% accuracy.    Status Achieved  SLP SHORT TERM GOAL #4   Title With minimal cues, pt will correctly spell functional phrases with > 90% accuracy.    Status Achieved            SLP Long Term Goals - 05/23/20 1540      SLP LONG TERM GOAL #1   Title Pt will utilize word finding strategies to communicate/convey abstract complex information independently.    Status Achieved      SLP LONG TERM GOAL #2   Title Pt will use speech intelligibility strategies to achieve >95% intelligibility when conveying complex abstract information.    Status Achieved      SLP LONG TERM GOAL #3   Title Pt will read complex material with > 75% comprehension.    Status Achieved      SLP LONG TERM GOAL #4   Title Pt will compose a semi-complex email with < 25% spelling  errors.    Status Achieved            Plan - 05/23/20 0921    Treatment/Interventions Language facilitation;Compensatory techniques;SLP instruction and feedback;Patient/family education    Potential to Achieve Goals Good    SLP Home Exercise Plan provided    Consulted and Agree with Plan of Care Patient           Patient will benefit from skilled therapeutic intervention in order to improve the following deficits and impairments:   Aphasia  Apraxia  Left middle cerebral artery stroke [facility])    Problem List Patient Active Problem List   Diagnosis Date Noted  . Transaminitis   . Hemiparesis affecting dominant side as late effect of stroke ([place])   . Blood pressure increase diastolic   . 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 Urinary retention   . Dyslipidemia   . Benign essential HTN   . Acute ischemic stroke ([place])   . Global aphasia   . Depression   . Hyperglycemia   . Right hemiparesis ([place])   . Aphasia   . Elevated blood pressure reading   . Stroke (cerebrum) ([place]) 03/17/2020  . Enthesopathy of hip region on both sides 01/26/2019  . Chronic right shoulder pain 12/10/2014  . Scapular dyskinesis 07/29/2014  . Bursitis of right shoulder 03/01/2014   Danyale Ridinger B. Rutherford Nail M.S., CCC-SLP, [name] Pathologist Rehabilitation Services Office [phone]  Stormy Fabian 05/23/2020, 9:30 AM  [place] MAIN [facility] SERVICES [address] [place], [place], [address] Phone: [phone]   Fax:  [phone]   Name: Cody Henson MRN: [id number] Date of Birth: [birth year]

## 2020-05-24 ENCOUNTER — Encounter: Payer: Self-pay | Admitting: Adult Health

## 2020-05-25 ENCOUNTER — Other Ambulatory Visit: Payer: Self-pay

## 2020-05-25 ENCOUNTER — Ambulatory Visit: Payer: BC Managed Care – PPO

## 2020-05-25 ENCOUNTER — Encounter: Payer: BC Managed Care – PPO | Admitting: Speech Pathology

## 2020-05-25 DIAGNOSIS — M6281 Muscle weakness (generalized): Secondary | ICD-10-CM | POA: Diagnosis not present

## 2020-05-25 DIAGNOSIS — R4701 Aphasia: Secondary | ICD-10-CM | POA: Diagnosis not present

## 2020-05-25 DIAGNOSIS — I63512 Cerebral infarction due to unspecified occlusion or stenosis of left middle cerebral artery: Secondary | ICD-10-CM | POA: Diagnosis not present

## 2020-05-25 DIAGNOSIS — R262 Difficulty in walking, not elsewhere classified: Secondary | ICD-10-CM | POA: Diagnosis not present

## 2020-05-25 DIAGNOSIS — R269 Unspecified abnormalities of gait and mobility: Secondary | ICD-10-CM

## 2020-05-25 DIAGNOSIS — R2681 Unsteadiness on feet: Secondary | ICD-10-CM | POA: Diagnosis not present

## 2020-05-25 DIAGNOSIS — R278 Other lack of coordination: Secondary | ICD-10-CM | POA: Diagnosis not present

## 2020-05-25 DIAGNOSIS — R482 Apraxia: Secondary | ICD-10-CM | POA: Diagnosis not present

## 2020-05-25 NOTE — Therapy (Signed)
[place] MAIN [facility] SERVICES [address] [place], [place], [address] Phone: [phone]   Fax:  [phone]  Physical Therapy Treatment  Patient Details  Name: Cody Henson MRN: [id number] Date of Birth: 1976-02-01 No data recorded  Encounter Date: 05/25/2020   PT End of Session - 05/25/20 0806    Visit Number 12    Number of Visits 25    Date for PT Re-Evaluation 07/05/20    PT Start Time 0802    PT Stop Time 0845    PT Time Calculation (min) 43 min    Equipment Utilized During Treatment Gait belt    Activity Tolerance Patient tolerated treatment well    Behavior During Therapy WFL for tasks assessed/performed           Past Medical History:  Diagnosis Date  . Allergy    Phreesia 04/10/2020  . Depressed   . Hyperlipidemia    Phreesia 04/10/2020  . Hypertension    Phreesia 04/10/2020  . Neuromuscular disorder ([place])    Phreesia 04/10/2020  . Stroke [facility])    Phreesia 04/10/2020    Past Surgical History:  Procedure Laterality Date  . BUBBLE STUDY  03/21/2020   Procedure: BUBBLE STUDY;  Surgeon: [name], MD;  Location: [place];  Service: Cardiology;;  . TEE WITHOUT CARDIOVERSION N/A 03/21/2020   Procedure: TRANSESOPHAGEAL ECHOCARDIOGRAM (TEE);  Surgeon: [name], MD;  Location: [place];  Service: Cardiology;  Laterality: N/A;    There were no vitals filed for this visit.   Subjective Assessment - 05/25/20 0805    Subjective Patient denies any issues at home- denies any pain or falls.    Pertinent History HTN    Limitations Walking;Standing    How long can you sit comfortably? unrestricted    How long can you stand comfortably? 15 mins    How long can you walk comfortably? 1 hour no AD    Patient Stated Goals be able to drive at type    Currently in Pain? No/denies              In Wellness zone Gym:  Gym equipment instruction:  Patient performed seated ham curl - Level 4 - 2 sets of  10 reps (patient reported "medium" ) Min assist with equipment negotiation  Patient performed seated knee ext- Level 5- 2 sets of 10 reps "Medium"  Patient performed seated Leg press- Level 6  2 sets of 10 reps "Challenging"   Patient performed seated calf press- Level 6 2 sets of 10 reps "Medium"  Patient performed 5 min of elliptical machine= 0.67 mi at level 5 with VC and visual demo to perform correctly. "Easy"  Sit to stand without UE support from bench- with alt LE out in front x 10 reps BLE "medium"  Lunge squat BLE x 10 reps " Hard"  Single leg calf raises x 10 reps BLE "Hard"  Side squat BLE x 10 reps "Medium"   In Rehab gym:  Matrix cable system:  Stand hip ext (7.5 lb) BLE 2 x 10 reps "Medium"  Standing Hip abd (7.5 lb) BLE 2 x 10 reps "Medium"  Education provided throughout session via VC/TC and demonstration to facilitate movement at target joints and correct muscle activation for all testing and exercises performed. Patient required less assistance/cues to operate machines today with good carryover.    Clinical impresson: Patient continues to progress overall with LE strengthening and able to perform well with more resistive  and progressive LE strengthening. Patient reported he was very fatigued at end of session. He will benefit from continued skilled PT services to progress his higher level balance and return to previous level of function.                           PT Education - 05/25/20 1640    Education Details specific gym equipment    Person(s) Educated Patient    Methods Explanation;Demonstration;Tactile cues;Verbal cues    Comprehension Verbalized understanding;Returned demonstration;Verbal cues required;Tactile cues required;Need further instruction            PT Short Term Goals - 05/11/20 1642      PT SHORT TERM GOAL #1   Title Patient will be independent in home exercise program to improve strength/mobility for better  functional independence with ADLs.    Baseline 05/11/2020. Patient presents with good working knowledge of home program but will continue to engage in progressive Home program desinged for strength/balance.    Status New    Target Date 07/05/20             PT Long Term Goals - 05/11/20 0937      PT LONG TERM GOAL #1   Title Patient will increase FOTO score to equal to or greater than 72% to demonstrate statistically significant improvement in mobility and quality of life.    Baseline 04/12/20 56%, 05/11/2020= 84%    Status Achieved      PT LONG TERM GOAL #2   Title Patient will increase Functional Gait Assessment score to >26/30 as to reduce fall risk and improve dynamic gait safety with community ambulation.    Baseline 04/12/20 FGA 22/30; 05/11/2020 FGA= 28/30    Time 0    Status On-going      PT LONG TERM GOAL #3   Title Patient will be able to navigate full flight of stairs independently with reciprocal pattern  to get to next level of home.    Baseline 04/12/20  step to pattern bilateral UE support. 05/11/2020- Patient able to demo independence with stair- recirpocal and no UE support for >15 steps.    Status Achieved    Target Date 07/05/20                 Plan - 05/25/20 0806    Clinical Impression Statement Patient continues to progress overall with LE strengthening and able to perform well with more resistive and progressive LE strengthening. Patient reported he was very fatigued at end of session. He will benefit from continued skilled PT services to progress his higher level balance and return to previous level of function.    Personal Factors and Comorbidities Comorbidity 2    Comorbidities HTN, depression    Examination-Activity Limitations Locomotion Level;Stairs    Examination-Participation Restrictions Yard Work;Driving;Community Activity    Stability/Clinical Decision Making Evolving/Moderate complexity    Rehab Potential Good    PT Frequency 2x / week    PT  Duration 12 weeks    PT Treatment/Interventions ADLs/Self Care Home Management;Gait training;Stair training;Functional mobility training;Therapeutic activities;Therapeutic exercise;Balance training;Neuromuscular re-education;Patient/family education;Passive range of motion    PT Next Visit Plan Continue with progressive balance activities and resistive exercises    PT Home Exercise Plan no changes.    Consulted and Agree with Plan of Care Patient           Patient will benefit from skilled therapeutic intervention in order to improve the following deficits  and impairments:  Abnormal gait,Decreased balance,Decreased endurance,Decreased mobility,Difficulty walking,Decreased range of motion,Improper body mechanics,Decreased activity tolerance,Decreased strength,Impaired flexibility,Postural dysfunction  Visit Diagnosis: Other lack of coordination  Muscle weakness (generalized)  Unsteadiness on feet  Abnormality of gait and mobility     Problem List Patient Active Problem List   Diagnosis Date Noted  . Transaminitis   . Hemiparesis affecting dominant side as late effect of stroke ([place])   . Blood pressure increase diastolic   . Left middle cerebral artery stroke ([place]) 03/23/2020  . Urinary retention   . Dyslipidemia   . Benign essential HTN   . Acute ischemic stroke ([place])   . Global aphasia   . Depression   . Hyperglycemia   . Right hemiparesis ([place])   . Aphasia   . Elevated blood pressure reading   . Stroke (cerebrum) ([place]) 03/17/2020  . Enthesopathy of hip region on both sides 01/26/2019  . Chronic right shoulder pain 12/10/2014  . Scapular dyskinesis 07/29/2014  . Bursitis of right shoulder 03/01/2014    Lewis Moccasin, PT 05/25/2020, 4:45 PM  [place] MAIN [facility] SERVICES [address] [place], [place], [address] Phone: [phone]   Fax:  [phone]  Name: Cody Henson MRN: [id number] Date of Birth: August 17, 1975

## 2020-05-30 ENCOUNTER — Encounter: Payer: BC Managed Care – PPO | Admitting: Speech Pathology

## 2020-05-30 ENCOUNTER — Ambulatory Visit: Payer: BC Managed Care – PPO | Admitting: Physical Therapy

## 2020-05-30 ENCOUNTER — Other Ambulatory Visit: Payer: Self-pay

## 2020-06-01 ENCOUNTER — Encounter: Payer: BC Managed Care – PPO | Admitting: Speech Pathology

## 2020-06-01 ENCOUNTER — Encounter: Payer: BC Managed Care – PPO | Admitting: Occupational Therapy

## 2020-06-01 ENCOUNTER — Ambulatory Visit: Payer: BC Managed Care – PPO

## 2020-06-02 ENCOUNTER — Other Ambulatory Visit: Payer: Self-pay

## 2020-06-02 DIAGNOSIS — I10 Essential (primary) hypertension: Secondary | ICD-10-CM

## 2020-06-02 MED ORDER — LISINOPRIL 10 MG PO TABS: 10.0000 mg | ORAL_TABLET | Freq: Every day | ORAL | 1 refills | Status: DC

## 2020-06-02 NOTE — Progress Notes (Signed)
Lisinopril 10 mg refilled

## 2020-06-06 ENCOUNTER — Encounter: Payer: BC Managed Care – PPO | Admitting: Speech Pathology

## 2020-06-06 ENCOUNTER — Ambulatory Visit: Payer: BC Managed Care – PPO

## 2020-06-08 ENCOUNTER — Encounter (INDEPENDENT_AMBULATORY_CARE_PROVIDER_SITE_OTHER): Payer: Self-pay

## 2020-06-08 ENCOUNTER — Encounter: Payer: BC Managed Care – PPO | Admitting: Occupational Therapy

## 2020-06-08 ENCOUNTER — Ambulatory Visit: Payer: BC Managed Care – PPO

## 2020-06-10 ENCOUNTER — Encounter: Payer: Self-pay | Admitting: Adult Health

## 2020-06-13 ENCOUNTER — Encounter: Payer: BC Managed Care – PPO | Admitting: Speech Pathology

## 2020-06-13 ENCOUNTER — Ambulatory Visit: Payer: BC Managed Care – PPO

## 2020-06-15 ENCOUNTER — Ambulatory Visit: Payer: BC Managed Care – PPO

## 2020-06-15 ENCOUNTER — Other Ambulatory Visit: Payer: Self-pay

## 2020-06-15 ENCOUNTER — Encounter: Payer: Self-pay | Admitting: Occupational Therapy

## 2020-06-15 ENCOUNTER — Encounter: Payer: BC Managed Care – PPO | Attending: Registered Nurse | Admitting: Physical Medicine & Rehabilitation

## 2020-06-15 ENCOUNTER — Ambulatory Visit: Payer: BC Managed Care – PPO | Admitting: Occupational Therapy

## 2020-06-15 ENCOUNTER — Encounter: Payer: Self-pay | Admitting: Physical Medicine & Rehabilitation

## 2020-06-15 ENCOUNTER — Encounter: Payer: BC Managed Care – PPO | Admitting: Speech Pathology

## 2020-06-15 VITALS — BP 157/99 | HR 58 | Temp 98.1°F | Ht 69.0 in | Wt 156.6 lb

## 2020-06-15 DIAGNOSIS — M6281 Muscle weakness (generalized): Secondary | ICD-10-CM | POA: Diagnosis not present

## 2020-06-15 DIAGNOSIS — R482 Apraxia: Secondary | ICD-10-CM | POA: Diagnosis not present

## 2020-06-15 DIAGNOSIS — R269 Unspecified abnormalities of gait and mobility: Secondary | ICD-10-CM | POA: Diagnosis not present

## 2020-06-15 DIAGNOSIS — R278 Other lack of coordination: Secondary | ICD-10-CM

## 2020-06-15 DIAGNOSIS — R4701 Aphasia: Secondary | ICD-10-CM | POA: Diagnosis not present

## 2020-06-15 DIAGNOSIS — I63512 Cerebral infarction due to unspecified occlusion or stenosis of left middle cerebral artery: Secondary | ICD-10-CM | POA: Diagnosis not present

## 2020-06-15 DIAGNOSIS — R262 Difficulty in walking, not elsewhere classified: Secondary | ICD-10-CM | POA: Diagnosis not present

## 2020-06-15 DIAGNOSIS — R2681 Unsteadiness on feet: Secondary | ICD-10-CM | POA: Diagnosis not present

## 2020-06-15 NOTE — Patient Instructions (Addendum)
PLEASE FEEL FREE TO CALL OUR OFFICE WITH ANY PROBLEMS OR QUESTIONS ([phone])  2-3 MONTH TIME FRAME TO RETURN TO WORK WITH FURTHER WORK CONDITIONING IN THE MEAN TIME. ALSO AEROBIC CONDITIONING WOULD BE IMPORTANT.

## 2020-06-15 NOTE — Progress Notes (Signed)
Subjective:    Patient ID: Cody Henson, male    DOB: [birth year], 45 y.o.   MRN: [id number]  HPI   Cody Henson is here in follow up of his left MCA infarct. I last saw him in [place]. He is wrapping up therapies at [facility]. He has a few visits left with PT, OT.He has ongoing sensory loss in the RUE/RLE although his coordination is better. Strength is improving too. He works on Visual merchandiser exercises at home. He is walking without a device. He hasn't fallen. He does chores at home.  He recently replaced an axle on his car with the assistance of his mother.  He had questions about return to work. 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 A lot of fine motor coordination required.   He also had questions about reasonable expectations for neurological recovery.  Pain Inventory Average Pain 0 Pain Right Now 0 My pain is no pain  In the last 24 hours, has pain interfered with the following? General activity 0 Relation with others 0 Enjoyment of life 0 What TIME of day is your pain at its worst? no pain Sleep (in general) Fair  Pain is worse with: no pain Pain improves with: no pain Relief from Meds: no pain  Family History  Problem Relation Age of Onset  . Stroke Paternal Grandfather   . Stroke Maternal Aunt    Social History   Socioeconomic History  . Marital status: Married    Spouse name: Not on file  . Number of children: Not on file  . Years of education: Not on file  . Highest education level: Not on file  Occupational History  . Not on file  Tobacco Use  . Smoking status: Never Smoker  . Smokeless tobacco: Never Used  Substance and Sexual Activity  . Alcohol use: Not on file  . Drug use: Not on file  . Sexual activity: Not on file  Other Topics Concern  . Not on file  Social History Narrative  . Not on file   Social Determinants of Health   Financial Resource Strain: Not on file  Food Insecurity: Not  on file  Transportation Needs: Not on file  Physical Activity: Not on file  Stress: Not on file  Social Connections: Not on file   Past Surgical History:  Procedure Laterality Date  . BUBBLE STUDY  03/21/2020   Procedure: BUBBLE STUDY;  Surgeon: [name], MD;  Location: [place];  Service: Cardiology;;  . TEE WITHOUT CARDIOVERSION N/A 03/21/2020   Procedure: TRANSESOPHAGEAL ECHOCARDIOGRAM (TEE);  Surgeon: [name], MD;  Location: [place];  Service: Cardiology;  Laterality: N/A;   Past Surgical History:  Procedure Laterality Date  . BUBBLE STUDY  03/21/2020   Procedure: BUBBLE STUDY;  Surgeon: [name], MD;  Location: [place];  Service: Cardiology;;  . TEE WITHOUT CARDIOVERSION N/A 03/21/2020   Procedure: TRANSESOPHAGEAL ECHOCARDIOGRAM (TEE);  Surgeon: [name], MD;  Location: [place];  Service: Cardiology;  Laterality: N/A;   Past Medical History:  Diagnosis Date  . Allergy    Phreesia 04/10/2020  . Depressed   . Hyperlipidemia    Phreesia 04/10/2020  . Hypertension    Phreesia 04/10/2020  . Neuromuscular disorder ([place])    Phreesia 04/10/2020  . Stroke ([place])    Phreesia 04/10/2020   BP (!) 157/99   Pulse (!) 58   Temp 98.1 F (36.7 C)  Ht 5\' 9"  (1.753 m)   Wt 156 lb 9.6 oz (71 kg)   SpO2 98%   BMI 23.13 kg/m   Opioid Risk Score:   Fall Risk Score:  `1  Depression screen PHQ 2/9  Depression screen [facility] 2/9 05/17/2020 04/19/2020  Decreased Interest 0 0  Down, Depressed, Hopeless 1 1  PHQ - 2 Score 1 1  Altered sleeping 1 0  Tired, decreased energy 3 3  Change in appetite 0 0  Feeling bad or failure about yourself  0 0  Trouble concentrating 0 0  Moving slowly or fidgety/restless 1 0  Suicidal thoughts 0 0  PHQ-9 Score 6 4  Difficult doing work/chores Somewhat difficult Not difficult at all     Review of Systems  All other systems reviewed and are negative.      Objective:   Physical Exam  Gen:  no distress, normal appearing HEENT: oral mucosa pink and moist, NCAT Cardio: Reg rate Chest: normal effort, normal rate of breathing Abd: soft, non-distended Ext: no edema Psych: pleasant, normal affect Skin: intact Neuro: word finding deficits, non-fluent, able to convey thoughts.  Speech became more fluent the longer we talked.  No focal cranial nerve abnormalities.  Strength was 4+ to 5 out of 5 in the right arm and right leg.  He is 5 out of 5 on the left.  He did lack fine motor coordination of the right hand and had troubles with fine motor tasks but was able to accomplish them with extra time.  Balance was good.  He is able to walk heel-to-toe without loss of balance. Musculoskeletal: Full ROM, No pain with AROM or PROM in the neck, trunk, or extremities. Posture appropriate       Assessment & Plan:  Medical Problem List and Plan: 1.  Right hemiparesis with aphasia secondary to acute ischemic infarct left MCA distribution.  Status post TPA             -complete outpt therapies  -"work" conditioning to address fine motor tasks if he wants to get back into the lab.  I think a reasonable projection to return to sedentary work would be 2 to 3 months.  He can use that time to improve his coordination to see if he is able to return to a job similar to what he was doing prior to the stroke  -We discussed neurological return after stroke.  While he is through the acute phase of recovery he should continue to see improvement with his speech and fine motor coordination.  He will need to continue working on improving the quality of his language as well as his fine motor coordination over the next several months to year ahead. 2.  Antithrombotics: -baby aspirin alone               3. Pain Management: Tylenol as needed 4. Mood/depression: Zoloft 25 mg daily.  We will continue this for now             -on adderall pta  5. PFO identified on TEE.  Cardiac cath next week.        Fifteen minutes  of face to face patient care time were spent during this visit. All questions were encouraged and answered.  Follow up with me as needed.

## 2020-06-15 NOTE — Therapy (Signed)
[place] MAIN [facility] SERVICES [address] [place], [place], [address] Phone: [phone]   Fax:  [phone]  Occupational Therapy Treatment  Patient Details  Name: Cody Henson MRN: [id number] Date of Birth: Nov 15, 1975 No data recorded  Encounter Date: 06/15/2020   OT End of Session - 06/15/20 1316    Visit Number 13    Date for OT Re-Evaluation 07/05/20    OT Start Time 1017    OT Stop Time 1100    OT Time Calculation (min) 43 min    Activity Tolerance Patient tolerated treatment well    Behavior During Therapy [facility] for tasks assessed/performed           Past Medical History:  Diagnosis Date  . Allergy    Phreesia 04/10/2020  . Depressed   . Hyperlipidemia    Phreesia 04/10/2020  . Hypertension    Phreesia 04/10/2020  . Neuromuscular disorder ([place])    Phreesia 04/10/2020  . Stroke [facility])    Phreesia 04/10/2020    Past Surgical History:  Procedure Laterality Date  . BUBBLE STUDY  03/21/2020   Procedure: BUBBLE STUDY;  Surgeon: [name], MD;  Location: [place];  Service: Cardiology;;  . TEE WITHOUT CARDIOVERSION N/A 03/21/2020   Procedure: TRANSESOPHAGEAL ECHOCARDIOGRAM (TEE);  Surgeon: [name], MD;  Location: [place];  Service: Cardiology;  Laterality: N/A;    There were no vitals filed for this visit.   OT TREATMENT    Neuro muscular re-education:  Pt. performed right [facility] tasks using the Grooved pegboard. Pt. worked on grasping the grooved pegs from a horizontal position, and moving the pegs to a vertical position in the hand to prepare for placing them in the grooved slot. Pt. worked on moving the objects through his right hand from his palm to the tip of his 2nd digit and thumb.   Therapeutic Exercise:  Pt. worked on [employer], and reciprocal motion using the UBE in standing for 8 min. with moderate resistance. Constant monitoring was provided. Pt. performed gross gripping with a  handheld grip strengthener. Pt. worked on sustaining grip while grasping pegs and reaching at various heights. The Gripper was set at 23.4# Pt was able to clear the board x's 2.  Pt. has returned to therapy following family visits to [place] followed by a trip to [place] to visit family. Pt. reports that he has begun driving a little with his wife, and has started driving on roadways. Pt. had difficulty performing translatory movements sliding the grooved pegs through his right hand. Pt. was able to place the grooved pegs into each slot, however presented with increased compensation during the task. Pt. continues to work on improving RUE strength, motor control, and [facility] skills in order to work towards improving, and maximizing independence with daily ADLs, IADLs, and work related tasks.                              OT Education - 06/15/20 1316    Education Details RUE functional use, HEP, Swift home exercises    Person(s) Educated Patient    Methods Explanation;Demonstration    Comprehension Verbalized understanding;Returned demonstration               OT Long Term Goals - 05/12/20 1215      OT LONG TERM GOAL #1   Title Pt will be independent with home exercise program    Baseline no  program at eval, 05/11/20: ongoing changes to HEP    Time 12    Period Weeks    Status On-going    Target Date 07/05/20      OT LONG TERM GOAL #2   Title Pt will complete dressing skills, UE and LE with modified independence    Baseline difficulty with socks and shoes at times, unable to tie laces.  05/11/2020: Progressing occasional difficulty with tying laces.    Time 6    Period Weeks    Status On-going    Target Date 05/24/20      OT LONG TERM GOAL #3   Title Pt will improve right grip strength by 15# to assist with cutting meat with modified independence.    Baseline Eval unable, 05/11/2020: Difficulty with cutting meat secondary to decreased sensation however grip strength has  improved dramatically.    Time 12    Period Weeks    Status Partially Met    Target Date 07/05/20      OT LONG TERM GOAL #4   Title Pt will improve right hand coordination to brush teeth without difficulty.    Baseline Eval:has to use left hand currently, 05/11/2020: Tasks still uncoordinated    Time 6    Period Weeks    Status On-going    Target Date 05/24/20      OT LONG TERM GOAL #5   Title Patient will improve typing speed greater than 12 WPM to answer emails and perform work tasks.    Baseline Eval:  adjust WPM 4.05/11/2020: Partially met is improved to 8 words a minute    Time 12    Period Weeks    Status Partially Met    Target Date 07/05/20      OT LONG TERM GOAL #6   Title Patient will improve strength in right hand to pick up 1 yo daughter from high chair.    Baseline unable at eval    Time 12    Period Weeks    Status Partially Met    Target Date 07/05/20      OT LONG TERM GOAL #7   Title Patient will improve hand function to write and sign his name on important papers with legibilty greater than 50%    Baseline Eval: 25%, poor legibility, 05/11/2020 still working on improving legibility    Time 12    Period Weeks    Status On-going    Target Date 07/05/20      OT LONG TERM GOAL #8   Title Pt will improve FOTO score to 65 or greater to show clinically relevant change to engage in ADL and IADL tasks with greater independence.    Baseline Eval 51    Time 12    Period Weeks    Status On-going    Target Date 07/05/20                 Plan - 06/15/20 1316    Clinical Impression Statement Pt. has returned to therapy following family visits to [place] followed by a trip to [place] to visit family. Pt. reports that he has begun driving a little with his wife, and has started driving on roadways. Pt. had difficulty performing translatory movements sliding the grooved pegs through his right hand. Pt. was able to place the grooved pegs into each slot, however presented  with increased compensation during the task. Pt. continues to work on improving RUE strength, motor control, and [facility] skills in order to  work towards improving, and maximizing independence with daily ADLs, IADLs, and work related tasks.    OT Occupational Profile and History Detailed Assessment- Review of Records and additional review of physical, cognitive, psychosocial history related to current functional performance    Occupational performance deficits (Please refer to evaluation for details): ADL's;IADL's;Work;Leisure;Social Participation    Body Structure / Function / Physical Skills ADL;Dexterity;Strength;Balance;Coordination;FMC;IADL;Endurance;Sensation;UE functional use;Proprioception    Psychosocial Skills Environmental  Adaptations;Habits;Routines and Behaviors    Rehab Potential Good    Clinical Decision Making Several treatment options, min-mod task modification necessary    Comorbidities Affecting Occupational Performance: May have comorbidities impacting occupational performance    Modification or Assistance to Complete Evaluation  No modification of tasks or assist necessary to complete eval    OT Frequency 2x / week    OT Duration 12 weeks    OT Treatment/Interventions Self-care/ADL training;Cryotherapy;Therapeutic exercise;DME and/or AE instruction;Balance training;Neuromuscular education;Manual Therapy;Moist Heat;Contrast Bath;Therapeutic activities;Patient/family education    Consulted and Agree with Plan of Care Patient           Patient will benefit from skilled therapeutic intervention in order to improve the following deficits and impairments:   Body Structure / Function / Physical Skills: ADL,Dexterity,Strength,Balance,Coordination,FMC,IADL,Endurance,Sensation,UE functional use,Proprioception   Psychosocial Skills: Environmental  Adaptations,Habits,Routines and Behaviors   Visit Diagnosis: Muscle weakness (generalized)  Other lack of coordination    Problem  List Patient Active Problem List   Diagnosis Date Noted  . Transaminitis   . Hemiparesis affecting dominant side as late effect of stroke ([place])   . Blood pressure increase diastolic   . Left middle cerebral artery stroke ([place]) 03/23/2020  . Urinary retention   . Dyslipidemia   . Benign essential HTN   . Acute ischemic stroke ([place])   . Global aphasia   . Depression   . Hyperglycemia   . Right hemiparesis ([place])   . Aphasia   . Elevated blood pressure reading   . Stroke (cerebrum) ([place]) 03/17/2020  . Enthesopathy of hip region on both sides 01/26/2019  . Chronic right shoulder pain 12/10/2014  . Scapular dyskinesis 07/29/2014  . Bursitis of right shoulder 03/01/2014    Harrel Carina, MS, OTR/L 06/15/2020, 1:18 PM  [place] MAIN [facility] SERVICES [address] [place], [place], [address] Phone: [phone]   Fax:  [phone]  Name: Cody Henson MRN: [id number] Date of Birth: December 08, 1975

## 2020-06-16 ENCOUNTER — Ambulatory Visit: Payer: BC Managed Care – PPO

## 2020-06-16 ENCOUNTER — Encounter: Payer: Self-pay | Admitting: Physician Assistant

## 2020-06-16 ENCOUNTER — Other Ambulatory Visit: Payer: Self-pay

## 2020-06-16 ENCOUNTER — Ambulatory Visit: Payer: BC Managed Care – PPO | Admitting: Physician Assistant

## 2020-06-16 VITALS — BP 118/60 | HR 52 | Ht 69.0 in | Wt 155.0 lb

## 2020-06-16 DIAGNOSIS — R278 Other lack of coordination: Secondary | ICD-10-CM | POA: Diagnosis not present

## 2020-06-16 DIAGNOSIS — Q2112 Patent foramen ovale: Secondary | ICD-10-CM

## 2020-06-16 DIAGNOSIS — R262 Difficulty in walking, not elsewhere classified: Secondary | ICD-10-CM

## 2020-06-16 DIAGNOSIS — I63512 Cerebral infarction due to unspecified occlusion or stenosis of left middle cerebral artery: Secondary | ICD-10-CM | POA: Diagnosis not present

## 2020-06-16 DIAGNOSIS — R269 Unspecified abnormalities of gait and mobility: Secondary | ICD-10-CM | POA: Diagnosis not present

## 2020-06-16 DIAGNOSIS — M6281 Muscle weakness (generalized): Secondary | ICD-10-CM

## 2020-06-16 DIAGNOSIS — R2681 Unsteadiness on feet: Secondary | ICD-10-CM | POA: Diagnosis not present

## 2020-06-16 DIAGNOSIS — Q211 Atrial septal defect: Secondary | ICD-10-CM

## 2020-06-16 DIAGNOSIS — R482 Apraxia: Secondary | ICD-10-CM | POA: Diagnosis not present

## 2020-06-16 DIAGNOSIS — R4701 Aphasia: Secondary | ICD-10-CM | POA: Diagnosis not present

## 2020-06-16 MED ORDER — CLOPIDOGREL BISULFATE 75 MG PO TABS: 75.0000 mg | ORAL_TABLET | Freq: Every day | ORAL | 1 refills | Status: AC

## 2020-06-16 NOTE — Progress Notes (Signed)
HEART AND [place]                                     Cardiology Office Note:    Date:  06/16/2020   ID:  [name], DOB 13-Jul-1975, MRN [id number]  PCP:  Camillia Herter, NP  [facility] Cardiologist:  No primary care provider on file.  [facility] Electrophysiologist:  None   Referring MD: No ref. provider found   Set up PFO closure   History of Present Illness:    Cody Henson is a 45 y.o. male with a hx of cryptogenic CVA and PFO who presents to clinic for follow up.   On March 16, 2020, he developed acute onset of right hemiparesis, facial droop, and aphasia.  He was found to have an acute left MCA stroke without hemorrhage.  The patient was treated with TPA.  Follow-up MRI study showed mild scattered petechial hemorrhage but no frank hemorrhagic transformation.  CTA study showed no evidence of large vessel vascular disease.  Bilateral venous Doppler studies were negative.  Hypercoagulability panel was negative.  Transesophageal echo study demonstrated PFO with right to left shunting confirmed by agitated saline study. TCD bubble study was positive. He was seen by Dr. Burt Knack in 04/2020 for evaluation of percutaneous PFO closure. He was felt to be a good candidate for this.   Today he presents to clinic to get PFO closure set up. Here with wife. No CP or SOB. No LE edema, orthopnea or PND. No dizziness or syncope. No blood in stool or urine. No palpitations. No neuro symptoms.  Past Medical History:  Diagnosis Date  . Allergy    Phreesia 04/10/2020  . Depressed   . Hyperlipidemia    Phreesia 04/10/2020  . Hypertension    Phreesia 04/10/2020  . Neuromuscular disorder ([place])    Phreesia 04/10/2020  . Stroke [facility])    Phreesia 04/10/2020    Past Surgical History:  Procedure Laterality Date  . BUBBLE STUDY  03/21/2020   Procedure: BUBBLE STUDY;  Surgeon: [name], MD;  Location: [place];  Service: Cardiology;;   . TEE WITHOUT CARDIOVERSION N/A 03/21/2020   Procedure: TRANSESOPHAGEAL ECHOCARDIOGRAM (TEE);  Surgeon: [name], MD;  Location: [place];  Service: Cardiology;  Laterality: N/A;    Current Medications: Current Meds  Medication Sig  . aspirin EC 81 MG tablet Take 1 tablet (81 mg total) by mouth daily. Swallow whole.  Marland Kitchen atorvastatin (LIPITOR) 80 MG tablet Take 1 tablet (80 mg total) by mouth daily.  . clopidogrel (PLAVIX) 75 MG tablet Take 1 tablet (75 mg total) by mouth daily.  Marland Kitchen lisinopril (ZESTRIL) 10 MG tablet Take 1 tablet (10 mg total) by mouth daily.  . Multiple Vitamin (MULTIVITAMIN) tablet Take 1 tablet by mouth daily.  . sertraline (ZOLOFT) 25 MG tablet Take 25 mg by mouth daily.  Marland Kitchen vortioxetine HBr (TRINTELLIX) 10 MG TABS tablet      Allergies:   Albumen, egg; Other; and Eggs or egg-derived products   Social History   Socioeconomic History  . Marital status: Married    Spouse name: Not on file  . Number of children: Not on file  . Years of education: Not on file  . Highest education level: Not on file  Occupational History  . Not on file  Tobacco Use  . Smoking status: Never Smoker  .  Smokeless tobacco: Never Used  Substance and Sexual Activity  . Alcohol use: Not on file  . Drug use: Not on file  . Sexual activity: Not on file  Other Topics Concern  . Not on file  Social History Narrative  . Not on file   Social Determinants of Health   Financial Resource Strain: Not on file  Food Insecurity: Not on file  Transportation Needs: Not on file  Physical Activity: Not on file  Stress: Not on file  Social Connections: Not on file     Family History: The patient's family history includes Stroke in his maternal aunt and paternal grandfather.  ROS:   Please see the history of present illness.    All other systems reviewed and are negative.  EKGs/Labs/Other Studies Reviewed:    The following studies were reviewed today:  The following  studies were reviewed today: TCD Bubble Study: Between 10 to 15 HITS (High Intensity Transient Signals) were heard at  rest, indicating a Spencer Grade 2 PFO (Patent Foramen Ovale) at rest.  Between 15 to 50 HITS were heard during valsalva, indicating a Spencer  Grade 3 PFO with valsalva maneuver.   __________________   TEE: IMPRESSIONS  1. Evidence of atrial level shunting detected by color flow Doppler.  Agitated saline contrast bubble study was positive with shunting observed  within 3-6 cardiac cycles suggestive of interatrial shunt.  2. Left ventricular ejection fraction, by estimation, is 65 to 70%. The  left ventricle has normal function. The left ventricle has no regional  wall motion abnormalities.  3. Right ventricular systolic function is normal. The right ventricular  size is normal.  4. No left atrial/left atrial appendage thrombus was detected.  5. The mitral valve is normal in structure. Trivial mitral valve  regurgitation. No evidence of mitral stenosis.  6. The aortic valve is normal in structure. Aortic valve regurgitation is  not visualized. No aortic stenosis is present.  7. Aortic dilatation noted. There is borderline dilatation of the aortic  root, measuring 38 mm.   Conclusion(s)/Recommendation(s): Normal biventricular function without  evidence of hemodynamically significant valvular heart disease. PFO  present with right to left shunt.   EKG:  EKG is  ordered today.  The ekg ordered today demonstrates sinus brady HR 52  Recent Labs: 03/28/2020: BUN 15; Creatinine, Ser 1.04; Potassium 3.9; Sodium 141 04/04/2020: Hemoglobin 15.4; Platelets 358 05/17/2020: ALT 41; TSH 1.620  Recent Lipid Panel    Component Value Date/Time   CHOL 170 05/17/2020 1025   TRIG 169 (H) 05/17/2020 1025   HDL 35 (L) 05/17/2020 1025   CHOLHDL 4.9 05/17/2020 1025   CHOLHDL 5.3 03/17/2020 0344   VLDL 23 03/17/2020 0344   LDLCALC 105 (H) 05/17/2020 1025     Risk  Assessment/Calculations:       Physical Exam:    VS:  BP 118/60   Pulse (!) 52   Ht 5\' 9"  (1.753 m)   Wt 155 lb (70.3 kg)   SpO2 98%   BMI 22.89 kg/m     Wt Readings from Last 3 Encounters:  06/16/20 155 lb (70.3 kg)  06/15/20 156 lb 9.6 oz (71 kg)  05/17/20 161 lb 6.4 oz (73.2 kg)     GEN:  Well nourished, well developed in no acute distress HEENT: Normal NECK: No JVD; No carotid bruits LYMPHATICS: No lymphadenopathy CARDIAC: RRR, no murmurs, rubs, gallops RESPIRATORY:  Clear to auscultation without rales, wheezing or rhonchi  ABDOMEN: Soft, non-tender, non-distended MUSCULOSKELETAL:  No edema; No deformity  SKIN: Warm and dry NEUROLOGIC:  Alert and oriented x 3 PSYCHIATRIC:  Normal affect   ASSESSMENT:    1. PFO (patent foramen ovale)    PLAN:    In order of problems listed above:  Patent foramen ovale: set up for PFO closure with Dr. Burt Knack on 06/22/20. Will start on plavix load now. Continue aspirin. He understands the need for SBE prophylaxis x 6 months. Pre op ecg and labs done today.  The patient is counseled about the association of PFO and cryptogenic stroke. Available clinical trial data is reviewed, specifically those trials comparing transcatheter PFO closure and medical therapy with antiplatelet drugs. The patient understands the potential benefit of PFO closure with respect to secondary stroke reduction compared with medical therapy alone. Specific risks of transcatheter PFO closure are reviewed with the patient. These risks include bleeding, infection, device embolization, stroke, cardiac perforation, tamponade, arrhythmia, MI, and late device erosion. He understands these serious risks occur at low incidence of < 1%.  The patient understands the need to take dual antiplatelet therapy with aspirin and clopidogrel together for a minimum period of 3 months following transcatheter PFO closure.  They understand the need to follow SBE prophylaxis per guidelines for a  period of 6 months following PFO closure.  The patient provides full informed consent for the procedure.    Medication Adjustments/Labs and Tests Ordered: Current medicines are reviewed at length with the patient today.  Concerns regarding medicines are outlined above.  Orders Placed This Encounter  Procedures  . Basic metabolic panel  . CBC with Differential/Platelet  . EKG 12-Lead   Meds ordered this encounter  Medications  . clopidogrel (PLAVIX) 75 MG tablet    Sig: Take 1 tablet (75 mg total) by mouth daily.    Dispense:  90 tablet    Refill:  1    Patient Instructions  Medication Instructions:  1) START PLAVIX 75 mg daily *If you need a refill on your cardiac medications before your next appointment, please call your pharmacy*   COVID SCREENING INFORMATION (06/20/20): You are scheduled for your drive-thru COVID screening on 5/2 between 9:30AM and 10:30AM. Pre-Procedural COVID-19 Testing Site [address] [place], [place] [address] You will need to go home after your screening and quarantine until your procedure.   PFO CLOSURE INSTRUCTIONS (06/22/2020): You are scheduled for a PFO CLOSURE on Wednesday, May 4 with Dr. Sherren Mocha.  1. Please arrive at the [facility] (Main Entrance A) at [facility]: [address] [place],  [address] at 6:30 AM (This time is two hours before your procedure to ensure your preparation). Free valet parking service is available. You are allowed ONE visitor in the waiting room during your procedure. Both you and your guest must wear masks. Special note: Every effort is made to have your procedure done on time. Please understand that emergencies sometimes delay scheduled procedures.  2. Diet: Stay well hydrated the day before your procedure! Do not eat solid foods after midnight.  You may have clear liquids until 5am upon the day of the procedure.  3. Labs: TODAY! BMET, CBC  4. Medication instructions in preparation for your  procedure:  1) MAKE SURE TO TAKE YOUR ASPIRIN AND PLAVIX the morning of your procedure  2) You may take your other medications as directed with sips of water   5. Plan for one night stay--bring personal belongings. 6. Bring a current list of your medications and current insurance  cards. 7. You MUST have a responsible person to drive you home. 8. Someone MUST be with you the first 24 hours after you arrive home or your discharge will be delayed. 9. Please wear clothes that are easy to get on and off and wear slip-on shoes.   FOLLOW-UP APPOINTMENT (07/20/20): You are scheduled for your 1 month follow-up on 07/20/2020 at 1:30PM with Nell Range, [place].    Signed, Angelena Form, PA-C  06/16/2020 2:52 PM    [facility]

## 2020-06-16 NOTE — H&P (View-Only) (Signed)
HEART AND [place]                                     Cardiology Office Note:    Date:  06/16/2020   ID:  [name], DOB 13-Jul-1975, MRN [id number]  PCP:  Camillia Herter, NP  [facility] Cardiologist:  No primary care provider on file.  [facility] Electrophysiologist:  None   Referring MD: No ref. provider found   Set up PFO closure   History of Present Illness:    Cody Henson is a 45 y.o. male with a hx of cryptogenic CVA and PFO who presents to clinic for follow up.   On March 16, 2020, he developed acute onset of right hemiparesis, facial droop, and aphasia.  He was found to have an acute left MCA stroke without hemorrhage.  The patient was treated with TPA.  Follow-up MRI study showed mild scattered petechial hemorrhage but no frank hemorrhagic transformation.  CTA study showed no evidence of large vessel vascular disease.  Bilateral venous Doppler studies were negative.  Hypercoagulability panel was negative.  Transesophageal echo study demonstrated PFO with right to left shunting confirmed by agitated saline study. TCD bubble study was positive. He was seen by Dr. Burt Knack in 04/2020 for evaluation of percutaneous PFO closure. He was felt to be a good candidate for this.   Today he presents to clinic to get PFO closure set up. Here with wife. No CP or SOB. No LE edema, orthopnea or PND. No dizziness or syncope. No blood in stool or urine. No palpitations. No neuro symptoms.  Past Medical History:  Diagnosis Date  . Allergy    Phreesia 04/10/2020  . Depressed   . Hyperlipidemia    Phreesia 04/10/2020  . Hypertension    Phreesia 04/10/2020  . Neuromuscular disorder ([place])    Phreesia 04/10/2020  . Stroke [facility])    Phreesia 04/10/2020    Past Surgical History:  Procedure Laterality Date  . BUBBLE STUDY  03/21/2020   Procedure: BUBBLE STUDY;  Surgeon: [name], MD;  Location: [place];  Service: Cardiology;;   . TEE WITHOUT CARDIOVERSION N/A 03/21/2020   Procedure: TRANSESOPHAGEAL ECHOCARDIOGRAM (TEE);  Surgeon: [name], MD;  Location: [place];  Service: Cardiology;  Laterality: N/A;    Current Medications: Current Meds  Medication Sig  . aspirin EC 81 MG tablet Take 1 tablet (81 mg total) by mouth daily. Swallow whole.  Marland Kitchen atorvastatin (LIPITOR) 80 MG tablet Take 1 tablet (80 mg total) by mouth daily.  . clopidogrel (PLAVIX) 75 MG tablet Take 1 tablet (75 mg total) by mouth daily.  Marland Kitchen lisinopril (ZESTRIL) 10 MG tablet Take 1 tablet (10 mg total) by mouth daily.  . Multiple Vitamin (MULTIVITAMIN) tablet Take 1 tablet by mouth daily.  . sertraline (ZOLOFT) 25 MG tablet Take 25 mg by mouth daily.  Marland Kitchen vortioxetine HBr (TRINTELLIX) 10 MG TABS tablet      Allergies:   Albumen, egg; Other; and Eggs or egg-derived products   Social History   Socioeconomic History  . Marital status: Married    Spouse name: Not on file  . Number of children: Not on file  . Years of education: Not on file  . Highest education level: Not on file  Occupational History  . Not on file  Tobacco Use  . Smoking status: Never Smoker  .  Smokeless tobacco: Never Used  Substance and Sexual Activity  . Alcohol use: Not on file  . Drug use: Not on file  . Sexual activity: Not on file  Other Topics Concern  . Not on file  Social History Narrative  . Not on file   Social Determinants of Health   Financial Resource Strain: Not on file  Food Insecurity: Not on file  Transportation Needs: Not on file  Physical Activity: Not on file  Stress: Not on file  Social Connections: Not on file     Family History: The patient's family history includes Stroke in his maternal aunt and paternal grandfather.  ROS:   Please see the history of present illness.    All other systems reviewed and are negative.  EKGs/Labs/Other Studies Reviewed:    The following studies were reviewed today:  The following  studies were reviewed today: TCD Bubble Study: Between 10 to 15 HITS (High Intensity Transient Signals) were heard at  rest, indicating a Spencer Grade 2 PFO (Patent Foramen Ovale) at rest.  Between 15 to 50 HITS were heard during valsalva, indicating a Spencer  Grade 3 PFO with valsalva maneuver.   __________________   TEE: IMPRESSIONS  1. Evidence of atrial level shunting detected by color flow Doppler.  Agitated saline contrast bubble study was positive with shunting observed  within 3-6 cardiac cycles suggestive of interatrial shunt.  2. Left ventricular ejection fraction, by estimation, is 65 to 70%. The  left ventricle has normal function. The left ventricle has no regional  wall motion abnormalities.  3. Right ventricular systolic function is normal. The right ventricular  size is normal.  4. No left atrial/left atrial appendage thrombus was detected.  5. The mitral valve is normal in structure. Trivial mitral valve  regurgitation. No evidence of mitral stenosis.  6. The aortic valve is normal in structure. Aortic valve regurgitation is  not visualized. No aortic stenosis is present.  7. Aortic dilatation noted. There is borderline dilatation of the aortic  root, measuring 38 mm.   Conclusion(s)/Recommendation(s): Normal biventricular function without  evidence of hemodynamically significant valvular heart disease. PFO  present with right to left shunt.   EKG:  EKG is  ordered today.  The ekg ordered today demonstrates sinus brady HR 52  Recent Labs: 03/28/2020: BUN 15; Creatinine, Ser 1.04; Potassium 3.9; Sodium 141 04/04/2020: Hemoglobin 15.4; Platelets 358 05/17/2020: ALT 41; TSH 1.620  Recent Lipid Panel    Component Value Date/Time   CHOL 170 05/17/2020 1025   TRIG 169 (H) 05/17/2020 1025   HDL 35 (L) 05/17/2020 1025   CHOLHDL 4.9 05/17/2020 1025   CHOLHDL 5.3 03/17/2020 0344   VLDL 23 03/17/2020 0344   LDLCALC 105 (H) 05/17/2020 1025     Risk  Assessment/Calculations:       Physical Exam:    VS:  BP 118/60   Pulse (!) 52   Ht 5\' 9"  (1.753 m)   Wt 155 lb (70.3 kg)   SpO2 98%   BMI 22.89 kg/m     Wt Readings from Last 3 Encounters:  06/16/20 155 lb (70.3 kg)  06/15/20 156 lb 9.6 oz (71 kg)  05/17/20 161 lb 6.4 oz (73.2 kg)     GEN:  Well nourished, well developed in no acute distress HEENT: Normal NECK: No JVD; No carotid bruits LYMPHATICS: No lymphadenopathy CARDIAC: RRR, no murmurs, rubs, gallops RESPIRATORY:  Clear to auscultation without rales, wheezing or rhonchi  ABDOMEN: Soft, non-tender, non-distended MUSCULOSKELETAL:  No edema; No deformity  SKIN: Warm and dry NEUROLOGIC:  Alert and oriented x 3 PSYCHIATRIC:  Normal affect   ASSESSMENT:    1. PFO (patent foramen ovale)    PLAN:    In order of problems listed above:  Patent foramen ovale: set up for PFO closure with Dr. Burt Knack on 06/22/20. Will start on plavix load now. Continue aspirin. He understands the need for SBE prophylaxis x 6 months. Pre op ecg and labs done today.  The patient is counseled about the association of PFO and cryptogenic stroke. Available clinical trial data is reviewed, specifically those trials comparing transcatheter PFO closure and medical therapy with antiplatelet drugs. The patient understands the potential benefit of PFO closure with respect to secondary stroke reduction compared with medical therapy alone. Specific risks of transcatheter PFO closure are reviewed with the patient. These risks include bleeding, infection, device embolization, stroke, cardiac perforation, tamponade, arrhythmia, MI, and late device erosion. He understands these serious risks occur at low incidence of < 1%.  The patient understands the need to take dual antiplatelet therapy with aspirin and clopidogrel together for a minimum period of 3 months following transcatheter PFO closure.  They understand the need to follow SBE prophylaxis per guidelines for a  period of 6 months following PFO closure.  The patient provides full informed consent for the procedure.    Medication Adjustments/Labs and Tests Ordered: Current medicines are reviewed at length with the patient today.  Concerns regarding medicines are outlined above.  Orders Placed This Encounter  Procedures  . Basic metabolic panel  . CBC with Differential/Platelet  . EKG 12-Lead   Meds ordered this encounter  Medications  . clopidogrel (PLAVIX) 75 MG tablet    Sig: Take 1 tablet (75 mg total) by mouth daily.    Dispense:  90 tablet    Refill:  1    Patient Instructions  Medication Instructions:  1) START PLAVIX 75 mg daily *If you need a refill on your cardiac medications before your next appointment, please call your pharmacy*   COVID SCREENING INFORMATION (06/20/20): You are scheduled for your drive-thru COVID screening on 5/2 between 9:30AM and 10:30AM. Pre-Procedural COVID-19 Testing Site [address] [place], [place] [address] You will need to go home after your screening and quarantine until your procedure.   PFO CLOSURE INSTRUCTIONS (06/22/2020): You are scheduled for a PFO CLOSURE on Wednesday, May 4 with Dr. Sherren Mocha.  1. Please arrive at the [facility] (Main Entrance A) at [facility]: [address] [place],  [address] at 6:30 AM (This time is two hours before your procedure to ensure your preparation). Free valet parking service is available. You are allowed ONE visitor in the waiting room during your procedure. Both you and your guest must wear masks. Special note: Every effort is made to have your procedure done on time. Please understand that emergencies sometimes delay scheduled procedures.  2. Diet: Stay well hydrated the day before your procedure! Do not eat solid foods after midnight.  You may have clear liquids until 5am upon the day of the procedure.  3. Labs: TODAY! BMET, CBC  4. Medication instructions in preparation for your  procedure:  1) MAKE SURE TO TAKE YOUR ASPIRIN AND PLAVIX the morning of your procedure  2) You may take your other medications as directed with sips of water   5. Plan for one night stay--bring personal belongings. 6. Bring a current list of your medications and current insurance  cards. 7. You MUST have a responsible person to drive you home. 8. Someone MUST be with you the first 24 hours after you arrive home or your discharge will be delayed. 9. Please wear clothes that are easy to get on and off and wear slip-on shoes.   FOLLOW-UP APPOINTMENT (07/20/20): You are scheduled for your 1 month follow-up on 07/20/2020 at 1:30PM with Nell Range, [place].    Signed, Angelena Form, PA-C  06/16/2020 2:52 PM    [facility]

## 2020-06-16 NOTE — Therapy (Signed)
[place] MAIN [facility] SERVICES [address] [place], [place], [address] Phone: [phone]   Fax:  [phone]  Physical Therapy Treatment  Patient Details  Name: Cody Henson MRN: [id number] Date of Birth: 10/02/1975 No data recorded  Encounter Date: 06/16/2020   PT End of Session - 06/16/20 0810    Visit Number 13    Number of Visits 25    Date for PT Re-Evaluation 07/05/20    PT Start Time 0803    PT Stop Time 0844    PT Time Calculation (min) 41 min    Equipment Utilized During Treatment Gait belt    Activity Tolerance Patient tolerated treatment well    Behavior During Therapy [facility] for tasks assessed/performed           Past Medical History:  Diagnosis Date  . Allergy    Phreesia 04/10/2020  . Depressed   . Hyperlipidemia    Phreesia 04/10/2020  . Hypertension    Phreesia 04/10/2020  . Neuromuscular disorder ([place])    Phreesia 04/10/2020  . Stroke [facility])    Phreesia 04/10/2020    Past Surgical History:  Procedure Laterality Date  . BUBBLE STUDY  03/21/2020   Procedure: BUBBLE STUDY;  Surgeon: [name], MD;  Location: [place];  Service: Cardiology;;  . TEE WITHOUT CARDIOVERSION N/A 03/21/2020   Procedure: TRANSESOPHAGEAL ECHOCARDIOGRAM (TEE);  Surgeon: [name], MD;  Location: [place];  Service: Cardiology;  Laterality: N/A;    There were no vitals filed for this visit.   Subjective Assessment - 06/16/20 0809    Subjective Patient reports doing well- "I feel like I am back approx 90% as far as physical abilities."    Pertinent History HTN    Limitations Walking;Standing    How long can you sit comfortably? unrestricted    How long can you stand comfortably? 15 mins    How long can you walk comfortably? 1 hour no AD    Patient Stated Goals be able to drive at type    Currently in Pain? No/denies            Interventions: Reviewed home program as seen below:       Access Code:  [email or web address] URL: [email or web address] Date: 06/16/2020 Prepared by: Sande Brothers  Exercises Standing March with Counter Support - 1 x daily - 3 x weekly - 3 sets - 10 reps Standing Hip Abduction with Resistance at Ankles and Counter Support - 1 x daily - 3 x weekly - 3 sets - 10 reps Standing Hip Extension with Resistance at Ankles and Counter Support - 1 x daily - 3 x weekly - 3 sets - 10 reps Standing Knee Flexion with Counter Support - 1 x daily - 3 x weekly - 3 sets - 10 reps Mini Squat - 1 x daily - 3 x weekly - 3 sets - 10 reps Single Leg Heel Raise with Unilateral Counter Support - 1 x daily - 3 x weekly - 3 sets - 10 reps Toe Raises with Counter Support - 1 x daily - 3 x weekly - 3 sets - 10 reps Single Leg Stance - 1 x daily - 3 x weekly - 3 sets - 20 hold Walking Tandem Stance - 1 x daily - 3 x weekly - 3 sets   Reassessed FGA=28/30   Clinical impression: Patient able to verbalize and demonstrate good understanding of strengthening and balance exercises to be performed in home program. He is demonstrating  improving overall balance as seen by 28/30 on FGA and reporting no falls. He is appropriate today for discharge with all goals met. Patient is in agreement with discharge at this time.                 PT Education - 06/16/20 2154    Education Details Review of home program    Person(s) Educated Patient    Methods Explanation;Demonstration;Handout    Comprehension Verbalized understanding;Returned demonstration            PT Short Term Goals - 06/16/20 2158      PT SHORT TERM GOAL #1   Title Patient will be independent in home exercise program to improve strength/mobility for better functional independence with ADLs.    Baseline 05/11/2020. Patient presents with good working knowledge of home program but will continue to engage in progressive Home program desinged for strength/balance. 06/16/2020- Patient demo good understanding of  Strengthening and balance exercises at this time with no questions or concerns.    Status Achieved    Target Date 07/05/20             PT Long Term Goals - 06/16/20 2207      PT LONG TERM GOAL #1   Title Patient will increase FOTO score to equal to or greater than 72% to demonstrate statistically significant improvement in mobility and quality of life.    Baseline 04/12/20 56%, 05/11/2020= 84%, 06/16/2020=86%    Status Achieved      PT LONG TERM GOAL #2   Title Patient will increase Functional Gait Assessment score to >26/30 as to reduce fall risk and improve dynamic gait safety with community ambulation.    Baseline 04/12/20 FGA 22/30; 05/11/2020 FGA= 28/30; 06/16/2020=28/30    Time 0    Status Achieved      PT LONG TERM GOAL #3   Title Patient will be able to navigate full flight of stairs independently with reciprocal pattern  to get to next level of home.    Baseline 04/12/20  step to pattern bilateral UE support. 05/11/2020- Patient able to demo independence with stair- recirpocal and no UE support for >15 steps.    Status Achieved                 Plan - 06/16/20 0811    Clinical Impression Statement Patient able to verbalize and demonstrate good understanding of strengthening and balance exercises to be performed in home program. He is demonstrating improving overall balance as seen by 28/30 on FGA and reporting no falls. He is appropriate today for discharge with all goals met. Patient is in agreement with discharge at this time.    Personal Factors and Comorbidities Comorbidity 2    Comorbidities HTN, depression    Examination-Activity Limitations Locomotion Level;Stairs    Examination-Participation Restrictions Yard Work;Driving;Community Activity    Stability/Clinical Decision Making Evolving/Moderate complexity    Rehab Potential Good    PT Frequency 2x / week    PT Duration 12 weeks    PT Treatment/Interventions ADLs/Self Care Home Management;Gait training;Stair  training;Functional mobility training;Therapeutic activities;Therapeutic exercise;Balance training;Neuromuscular re-education;Patient/family education;Passive range of motion    PT Next Visit Plan Discharge today with goals met    PT Home Exercise Plan Access Code: [email or web address]  URL: [email or web address]    Consulted and Agree with Plan of Care Patient           Patient will benefit from skilled therapeutic intervention in order to improve the following deficits and impairments:  Abnormal gait,Decreased balance,Decreased endurance,Decreased mobility,Difficulty walking,Decreased range of motion,Improper body mechanics,Decreased activity tolerance,Decreased strength,Impaired flexibility,Postural dysfunction  Visit Diagnosis: Abnormality of gait and mobility  Difficulty in walking, not elsewhere classified  Muscle weakness (generalized)  Other lack of coordination     Problem List Patient Active Problem List   Diagnosis Date Noted  . Transaminitis   . Hemiparesis affecting dominant side as late effect of stroke ([place])   . Blood pressure increase diastolic   . Left middle cerebral artery stroke ([place]) 03/23/2020  . Urinary retention   . Dyslipidemia   . Benign essential HTN   . Acute ischemic stroke ([place])   . Global aphasia   . Depression   . Hyperglycemia   . Right hemiparesis ([place])   . Aphasia   . Elevated blood pressure reading   . Stroke (cerebrum) ([place]) 03/17/2020  . Enthesopathy of hip region on both sides 01/26/2019  . Chronic right shoulder pain 12/10/2014  . Scapular dyskinesis 07/29/2014  . Bursitis of right shoulder 03/01/2014    Lewis Moccasin, PT 06/16/2020, 10:20 PM  [place] MAIN [facility] SERVICES [address] [place], [place], [address] Phone: [phone]   Fax:  [phone]  Name: Ruvim Risko MRN: [id number] Date of Birth: August 05, 1975

## 2020-06-16 NOTE — Patient Instructions (Addendum)
Medication Instructions:  1) START PLAVIX 75 mg daily *If you need a refill on your cardiac medications before your next appointment, please call your pharmacy*   COVID SCREENING INFORMATION (06/20/20): You are scheduled for your drive-thru COVID screening on 5/2 between 9:30AM and 10:30AM. Pre-Procedural COVID-19 Testing Site [address] [place], [place] [address] You will need to go home after your screening and quarantine until your procedure.   PFO CLOSURE INSTRUCTIONS (06/22/2020): You are scheduled for a PFO CLOSURE on Wednesday, May 4 with Dr. Sherren Mocha.  1. Please arrive at the [facility] (Main Entrance A) at [facility]: [address] [place], [place] [address] at 6:30 AM (This time is two hours before your procedure to ensure your preparation). Free valet parking service is available. You are allowed ONE visitor in the waiting room during your procedure. Both you and your guest must wear masks. Special note: Every effort is made to have your procedure done on time. Please understand that emergencies sometimes delay scheduled procedures.  2. Diet: Stay well hydrated the day before your procedure! Do not eat solid foods after midnight.  You may have clear liquids until 5am upon the day of the procedure.  3. Labs: TODAY! BMET, CBC  4. Medication instructions in preparation for your procedure:  1) MAKE SURE TO TAKE YOUR ASPIRIN AND PLAVIX the morning of your procedure  2) You may take your other medications as directed with sips of water   5. Plan for one night stay--bring personal belongings. 6. Bring a current list of your medications and current insurance cards. 7. You MUST have a responsible person to drive you home. 8. Someone MUST be with you the first 24 hours after you arrive home or your discharge will be delayed. 9. Please wear clothes that are easy to get on and off and wear slip-on shoes.   FOLLOW-UP APPOINTMENT (07/20/20): You are scheduled for your 1  month follow-up on 07/20/2020 at 1:30PM with Nell Range, [place].

## 2020-06-17 LAB — CBC WITH DIFFERENTIAL/PLATELET
Basophils Absolute: 0.1 10*3/uL (ref 0.0–0.2)
Basos: 1 %
EOS (ABSOLUTE): 0.2 10*3/uL (ref 0.0–0.4)
Eos: 3 %
Hematocrit: 45 % (ref 37.5–51.0)
Hemoglobin: 15.2 g/dL (ref 13.0–17.7)
Immature Grans (Abs): 0 10*3/uL (ref 0.0–0.1)
Immature Granulocytes: 0 %
Lymphocytes Absolute: 1.9 10*3/uL (ref 0.7–3.1)
Lymphs: 33 %
MCH: 29.3 pg (ref 26.6–33.0)
MCHC: 33.8 g/dL (ref 31.5–35.7)
MCV: 87 fL (ref 79–97)
Monocytes Absolute: 0.4 10*3/uL (ref 0.1–0.9)
Monocytes: 7 %
Neutrophils Absolute: 3.3 10*3/uL (ref 1.4–7.0)
Neutrophils: 56 %
Platelets: 271 10*3/uL (ref 150–450)
RBC: 5.19 x10E6/uL (ref 4.14–5.80)
RDW: 12.6 % (ref 11.6–15.4)
WBC: 5.8 10*3/uL (ref 3.4–10.8)

## 2020-06-17 LAB — BASIC METABOLIC PANEL
BUN/Creatinine Ratio: 11 (ref 9–20)
BUN: 13 mg/dL (ref 6–24)
CO2: 20 mmol/L (ref 20–29)
Calcium: 9.6 mg/dL (ref 8.7–10.2)
Chloride: 104 mmol/L (ref 96–106)
Creatinine, Ser: 1.15 mg/dL (ref 0.76–1.27)
Glucose: 94 mg/dL (ref 65–99)
Potassium: 4.7 mmol/L (ref 3.5–5.2)
Sodium: 142 mmol/L (ref 134–144)
eGFR: 81 mL/min/{1.73_m2} (ref >59)

## 2020-06-20 ENCOUNTER — Encounter: Payer: BC Managed Care – PPO | Admitting: Speech Pathology

## 2020-06-20 ENCOUNTER — Other Ambulatory Visit (HOSPITAL_COMMUNITY)
Admission: RE | Admit: 2020-06-20 | Discharge: 2020-06-20 | Disposition: A | Discharge status: Home / Self Care | Payer: BC Managed Care – PPO | Source: Ambulatory Visit | Attending: Cardiovascular Disease | Admitting: Cardiovascular Disease

## 2020-06-20 ENCOUNTER — Ambulatory Visit: Payer: BC Managed Care – PPO

## 2020-06-20 ENCOUNTER — Telehealth: Payer: Self-pay | Admitting: *Deleted

## 2020-06-20 DIAGNOSIS — Z7902 Long term (current) use of antithrombotics/antiplatelets: Secondary | ICD-10-CM | POA: Diagnosis not present

## 2020-06-20 DIAGNOSIS — Z20822 Contact with and (suspected) exposure to covid-19: Secondary | ICD-10-CM | POA: Insufficient documentation

## 2020-06-20 DIAGNOSIS — Z7982 Long term (current) use of aspirin: Secondary | ICD-10-CM | POA: Diagnosis not present

## 2020-06-20 DIAGNOSIS — Z01812 Encounter for preprocedural laboratory examination: Secondary | ICD-10-CM | POA: Insufficient documentation

## 2020-06-20 DIAGNOSIS — Z91012 Allergy to eggs: Secondary | ICD-10-CM | POA: Diagnosis not present

## 2020-06-20 DIAGNOSIS — Q211 Atrial septal defect: Secondary | ICD-10-CM | POA: Diagnosis not present

## 2020-06-20 DIAGNOSIS — Z888 Allergy status to other drugs, medicaments and biological substances status: Secondary | ICD-10-CM | POA: Diagnosis not present

## 2020-06-20 DIAGNOSIS — Z79899 Other long term (current) drug therapy: Secondary | ICD-10-CM | POA: Diagnosis not present

## 2020-06-20 NOTE — Telephone Encounter (Signed)
Pt contacted pre-PFO closure scheduled at [facility] for: Wednesday Jun 22, 2020 8:30 AM Verified arrival time and place: [facility]) at: 6:30 AM   No solid food after midnight prior to cath, clear liquids until 5 AM day of procedure.   AM meds can be  taken pre-cath with sips of water including: ASA 81 mg Plavix 75 mg  Confirmed patient has responsible adult to drive home post procedure and be with patient first 24 hours after arriving home:  You are allowed ONE visitor in the waiting room during the time you are at the hospital for your procedure. Both you and your visitor must wear a mask once you enter the hospital.    [facility] to review procedure instructions with patient.

## 2020-06-21 LAB — SARS CORONAVIRUS 2 (TAT 6-24 HRS): SARS Coronavirus 2: NEGATIVE

## 2020-06-21 NOTE — Telephone Encounter (Signed)
No answer, voicemail message. 

## 2020-06-21 NOTE — Telephone Encounter (Signed)
LMTCB to review procedure instructions 

## 2020-06-22 ENCOUNTER — Other Ambulatory Visit: Payer: Self-pay

## 2020-06-22 ENCOUNTER — Ambulatory Visit (HOSPITAL_COMMUNITY)
Admission: RE | Admit: 2020-06-22 | Discharge: 2020-06-22 | Disposition: A | Discharge status: Home / Self Care | Payer: BC Managed Care – PPO | Source: Ambulatory Visit | Attending: Cardiovascular Disease | Admitting: Cardiovascular Disease

## 2020-06-22 ENCOUNTER — Ambulatory Visit (HOSPITAL_COMMUNITY)
Admission: RE | Disposition: A | Discharge status: Home / Self Care | Payer: BC Managed Care – PPO | Source: Ambulatory Visit | Attending: Cardiovascular Disease

## 2020-06-22 ENCOUNTER — Ambulatory Visit (HOSPITAL_BASED_OUTPATIENT_CLINIC_OR_DEPARTMENT_OTHER): Payer: BC Managed Care – PPO

## 2020-06-22 DIAGNOSIS — Z888 Allergy status to other drugs, medicaments and biological substances status: Secondary | ICD-10-CM | POA: Insufficient documentation

## 2020-06-22 DIAGNOSIS — Z7982 Long term (current) use of aspirin: Secondary | ICD-10-CM | POA: Insufficient documentation

## 2020-06-22 DIAGNOSIS — Q211 Atrial septal defect: Secondary | ICD-10-CM | POA: Insufficient documentation

## 2020-06-22 DIAGNOSIS — Z91012 Allergy to eggs: Secondary | ICD-10-CM | POA: Diagnosis not present

## 2020-06-22 DIAGNOSIS — Z7902 Long term (current) use of antithrombotics/antiplatelets: Secondary | ICD-10-CM | POA: Diagnosis not present

## 2020-06-22 DIAGNOSIS — Z79899 Other long term (current) drug therapy: Secondary | ICD-10-CM | POA: Diagnosis not present

## 2020-06-22 DIAGNOSIS — Z20822 Contact with and (suspected) exposure to covid-19: Secondary | ICD-10-CM | POA: Insufficient documentation

## 2020-06-22 DIAGNOSIS — Q2112 Patent foramen ovale: Secondary | ICD-10-CM

## 2020-06-22 HISTORY — PX: PATENT FORAMEN OVALE(PFO) CLOSURE: CATH118300

## 2020-06-22 LAB — ECHOCARDIOGRAM LIMITED
Height: 69 in
Weight: 2480 oz

## 2020-06-22 LAB — POCT ACTIVATED CLOTTING TIME: Activated Clotting Time: 243 seconds

## 2020-06-22 SURGERY — PATENT FORAMEN OVALE (PFO) CLOSURE
Anesthesia: LOCAL

## 2020-06-22 MED ORDER — MIDAZOLAM HCL 2 MG/2ML IJ SOLN
INTRAMUSCULAR | Status: DC | PRN
  Administered 2020-06-22 (×2): 1 mg via INTRAVENOUS
  Administered 2020-06-22: 2 mg via INTRAVENOUS

## 2020-06-22 MED ORDER — SODIUM CHLORIDE 0.9% FLUSH: 3.0000 mL | Freq: Two times a day (BID) | INTRAVENOUS | Status: DC

## 2020-06-22 MED ORDER — SODIUM CHLORIDE 0.9 % IV SOLN: 250.0000 mL | INTRAVENOUS | Status: DC | PRN

## 2020-06-22 MED ORDER — ASPIRIN 81 MG PO CHEW: 81.0000 mg | CHEWABLE_TABLET | ORAL | Status: DC

## 2020-06-22 MED ORDER — HEPARIN SODIUM (PORCINE) 1000 UNIT/ML IJ SOLN
INTRAMUSCULAR | Status: AC
  Filled 2020-06-22: qty 1

## 2020-06-22 MED ORDER — HEPARIN (PORCINE) IN NACL 1000-0.9 UT/500ML-% IV SOLN
INTRAVENOUS | Status: AC
  Filled 2020-06-22: qty 1000

## 2020-06-22 MED ORDER — ACETAMINOPHEN 325 MG PO TABS: 650.0000 mg | ORAL_TABLET | ORAL | Status: DC | PRN

## 2020-06-22 MED ORDER — CEFAZOLIN SODIUM-DEXTROSE 2-4 GM/100ML-% IV SOLN
INTRAVENOUS | Status: AC
  Filled 2020-06-22: qty 100

## 2020-06-22 MED ORDER — CLOPIDOGREL BISULFATE 75 MG PO TABS: 75.0000 mg | ORAL_TABLET | ORAL | Status: DC

## 2020-06-22 MED ORDER — HEPARIN SODIUM (PORCINE) 1000 UNIT/ML IJ SOLN
INTRAMUSCULAR | Status: DC | PRN
  Administered 2020-06-22: 6000 [IU] via INTRAVENOUS
  Administered 2020-06-22: 2000 [IU] via INTRAVENOUS

## 2020-06-22 MED ORDER — SODIUM CHLORIDE 0.9% FLUSH: 3.0000 mL | INTRAVENOUS | Status: DC | PRN

## 2020-06-22 MED ORDER — FENTANYL CITRATE (PF) 100 MCG/2ML IJ SOLN
INTRAMUSCULAR | Status: AC
  Filled 2020-06-22: qty 2

## 2020-06-22 MED ORDER — SODIUM CHLORIDE 0.9 % WEIGHT BASED INFUSION: 1.0000 mL/kg/h | INTRAVENOUS | Status: DC

## 2020-06-22 MED ORDER — ONDANSETRON HCL 4 MG/2ML IJ SOLN
4.0000 mg | Freq: Four times a day (QID) | INTRAMUSCULAR | Status: DC | PRN
Start: 2020-06-22 — End: 2020-06-22

## 2020-06-22 MED ORDER — MIDAZOLAM HCL 2 MG/2ML IJ SOLN
INTRAMUSCULAR | Status: AC
  Filled 2020-06-22: qty 2

## 2020-06-22 MED ORDER — LABETALOL HCL 5 MG/ML IV SOLN
10.0000 mg | INTRAVENOUS | Status: DC | PRN
Start: 2020-06-22 — End: 2020-06-22

## 2020-06-22 MED ORDER — LIDOCAINE HCL (PF) 1 % IJ SOLN
INTRAMUSCULAR | Status: DC | PRN
  Administered 2020-06-22: 15 mL

## 2020-06-22 MED ORDER — LIDOCAINE-EPINEPHRINE 1 %-1:100000 IJ SOLN
INTRAMUSCULAR | Status: AC
  Filled 2020-06-22: qty 1

## 2020-06-22 MED ORDER — LIDOCAINE HCL (PF) 1 % IJ SOLN
INTRAMUSCULAR | Status: AC
  Filled 2020-06-22: qty 30

## 2020-06-22 MED ORDER — SODIUM CHLORIDE 0.9 % WEIGHT BASED INFUSION
3.0000 mL/kg/h | INTRAVENOUS | Status: AC
  Administered 2020-06-22: 3 mL/kg/h via INTRAVENOUS

## 2020-06-22 MED ORDER — CEFAZOLIN SODIUM-DEXTROSE 2-3 GM-%(50ML) IV SOLR
INTRAVENOUS | Status: AC | PRN
  Administered 2020-06-22: 2 g via INTRAVENOUS

## 2020-06-22 MED ORDER — LIDOCAINE-EPINEPHRINE 1 %-1:100000 IJ SOLN
3.0000 mL | Freq: Once | INTRAMUSCULAR | Status: AC
  Administered 2020-06-22: 3 mL via INTRADERMAL

## 2020-06-22 MED ORDER — HEPARIN (PORCINE) IN NACL 1000-0.9 UT/500ML-% IV SOLN
INTRAVENOUS | Status: DC | PRN
  Administered 2020-06-22 (×2): 500 mL

## 2020-06-22 MED ORDER — CEFAZOLIN SODIUM-DEXTROSE 2-4 GM/100ML-% IV SOLN: 2.0000 g | INTRAVENOUS | Status: DC

## 2020-06-22 MED ORDER — FENTANYL CITRATE (PF) 100 MCG/2ML IJ SOLN
INTRAMUSCULAR | Status: DC | PRN
  Administered 2020-06-22: 25 ug via INTRAVENOUS
  Administered 2020-06-22: 50 ug via INTRAVENOUS
  Administered 2020-06-22: 25 ug via INTRAVENOUS

## 2020-06-22 MED ORDER — HYDRALAZINE HCL 20 MG/ML IJ SOLN: 10.0000 mg | INTRAMUSCULAR | Status: DC | PRN

## 2020-06-22 SURGICAL SUPPLY — 19 items
BALLN SIZING AMPLATZER 24 (BALLOONS) ×2
BALLOON SIZING AMPLATZER 24 (BALLOONS) ×1 IMPLANT
CATH ACUNAV 8FR 90CM (CATHETERS) ×2 IMPLANT
CATH SUPER TORQUE PLUS 6F MPA1 (CATHETERS) ×2 IMPLANT
CLOSURE PERCLOSE PROSTYLE (VASCULAR PRODUCTS) ×4 IMPLANT
COVER SWIFTLINK CONNECTOR (BAG) ×2 IMPLANT
GUIDEWIRE AMPLATZER 1.5JX260 (WIRE) ×2 IMPLANT
OCCLUDER AMPLATZER SEPTAL 14MM (Prosthesis & Implant Heart) ×2 IMPLANT
PACK CARDIAC CATHETERIZATION (CUSTOM PROCEDURE TRAY) ×2 IMPLANT
PROTECTION STATION PRESSURIZED (MISCELLANEOUS) ×2
SHEATH DELIVERY TALISMAN 8F 80 (SHEATH) ×1 IMPLANT
SHEATH INTROD W/O MIN 9FR 25CM (SHEATH) ×2 IMPLANT
SHEATH PINNACLE 5F 10CM (SHEATH) ×2 IMPLANT
SHEATH PINNACLE 8F 10CM (SHEATH) ×2 IMPLANT
STATION PROTECTION PRESSURIZED (MISCELLANEOUS) ×1 IMPLANT
SYS DELIVER AMP TREVISIO 8FR (SHEATH) ×2
SYSTEM DELIVER AMP TREVIS 8FR (SHEATH) ×1 IMPLANT
TALISMAN DELIVERY SHEATH 8F 80 (SHEATH) ×2
WIRE EMERALD 3MM-J .035X150CM (WIRE) ×2 IMPLANT

## 2020-06-22 NOTE — Interval H&P Note (Signed)
History and Physical Interval Note:  06/22/2020 8:23 AM  Cody Henson  has presented today for surgery, with the diagnosis of pfo.  The various methods of treatment have been discussed with the patient and family. After consideration of risks, benefits and other options for treatment, the patient has consented to  Procedure(s): PATENT FORAMEN OVALE (PFO) CLOSURE (N/A) as a surgical intervention.  The patient's history has been reviewed, patient examined, no change in status, stable for surgery.  I have reviewed the patient's chart and labs.  Questions were answered to the patient's satisfaction.     Sherren Mocha

## 2020-06-22 NOTE — Discharge Instructions (Signed)
You will require antibiotics prior to any dental work, including cleanings, for 6 months after your PFO/ASD closure. This is to protect the device from potentially getting infected from bacteria in your mouth entering your bloodstream. The medication will be called into your pharmacy at your 1 month follow up appointment. If you require dental work before that time, please call the office and it will be called into your pharmacy on file. Please pick this up to have ready before any scheduled dental work. Instructions will be outlined on the bottle.   Groin Site Care Refer to this sheet in the next few weeks. These instructions provide you with information on caring for yourself after your procedure. Your caregiver may also give you more specific instructions. Your treatment has been planned according to current medical practices, but problems sometimes occur. Call your caregiver if you have any problems or questions after your procedure. HOME CARE INSTRUCTIONS  You may shower 24 hours after the procedure. Remove the bandage (dressing) and gently wash the site with plain soap and water. Gently pat the site dry.   Do not apply powder or lotion to the site.   Do not sit in a bathtub, swimming pool, or whirlpool for 5 to 7 days.   No bending, squatting, or lifting anything over 10 pounds (4.5 kg) for 1 week  Inspect the site at least twice daily.   Do not drive home if you are discharged the same day of the procedure. Have someone else drive you.   You may drive 24 hours after the procedure unless otherwise instructed by your caregiver.  What to expect:  Any bruising will usually fade within 1 to 2 weeks.   Blood that collects in the tissue (hematoma) may be painful to the touch. It should usually decrease in size and tenderness within 1 to 2 weeks.  SEEK IMMEDIATE MEDICAL CARE IF:  You have unusual pain at the groin site or down the affected leg.   You have redness, warmth, swelling, or pain  at the groin site.   You have drainage (other than a small amount of blood on the dressing).   You have chills.   You have a fever or persistent symptoms for more than 72 hours.   You have a fever and your symptoms suddenly get worse.   Your leg becomes pale, cool, tingly, or numb.   You have heavy bleeding from the site. Hold pressure on the site.    Femoral Site Care  This sheet gives you information about how to care for yourself after your procedure. Your health care provider may also give you more specific instructions. If you have problems or questions, contact your health care provider. What can I expect after the procedure? After the procedure, it is common to have:  Bruising that usually fades within 1-2 weeks.  Tenderness at the site. Follow these instructions at home: Wound care  Follow instructions from your health care provider about how to take care of your insertion site. Make sure you: ? Wash your hands with soap and water before you change your bandage (dressing). If soap and water are not available, use hand sanitizer. ? Change your dressing as told by your health care provider. ? Leave stitches (sutures), skin glue, or adhesive strips in place. These skin closures may need to stay in place for 2 weeks or longer. If adhesive strip edges start to loosen and curl up, you may trim the loose edges. Do not remove adhesive  strips completely unless your health care provider tells you to do that.  Do not take baths, swim, or use a hot tub until your health care provider approves.  You may shower 24-48 hours after the procedure or as told by your health care provider. ? Gently wash the site with plain soap and water. ? Pat the area dry with a clean towel. ? Do not rub the site. This may cause bleeding.  Do not apply powder or lotion to the site. Keep the site clean and dry.  Check your femoral site every day for signs of infection. Check for: ? Redness, swelling,  or pain. ? Fluid or blood. ? Warmth. ? Pus or a bad smell. Activity  For the first 2-3 days after your procedure, or as long as directed: ? Avoid climbing stairs as much as possible. ? Do not squat.  Do not lift anything that is heavier than 10 lb (4.5 kg), or the limit that you are told, until your health care provider says that it is safe.  Rest as directed. ? Avoid sitting for a long time without moving. Get up to take short walks every 1-2 hours.  Do not drive for 24 hours if you were given a medicine to help you relax (sedative). General instructions  Take over-the-counter and prescription medicines only as told by your health care provider.  Keep all follow-up visits as told by your health care provider. This is important. Contact a health care provider if you have:  A fever or chills.  You have redness, swelling, or pain around your insertion site. Get help right away if:  The catheter insertion area swells very fast.  You pass out.  You suddenly start to sweat or your skin gets clammy.  The catheter insertion area is bleeding, and the bleeding does not stop when you hold steady pressure on the area.  The area near or just beyond the catheter insertion site becomes pale, cool, tingly, or numb. These symptoms may represent a serious problem that is an emergency. Do not wait to see if the symptoms will go away. Get medical help right away. Call your local emergency services (911 in the [place] Do not drive yourself to the hospital. Summary  After the procedure, it is common to have bruising that usually fades within 1-2 weeks.  Check your femoral site every day for signs of infection.  Do not lift anything that is heavier than 10 lb (4.5 kg), or the limit that you are told, until your health care provider says that it is safe. This information is not intended to replace advice given to you by your health care provider. Make sure you discuss any questions you have with  your health care provider. Document Revised: 10/09/2019 Document Reviewed: 10/09/2019 Elsevier Patient Education  [place].

## 2020-06-22 NOTE — Progress Notes (Signed)
Patient's right femoral vein site oozing when checked at 1011. Pressure initiated to site at time of bleed discovery (1011). Patient still oozing when pressure released 15 minutes later at 1025. Lisabeth Pick, RN took over holding pressure at this time and primary RN paged Dr. Burt Knack. Dr. Burt Knack instructed primary RN to continue holding pressure. Patient still oozing at 1035. Continuous pressure maintained and patient vitals stable at this time.

## 2020-06-22 NOTE — Progress Notes (Signed)
Discharge instructions reviewed with pt and his wife. Both voice understanding. 

## 2020-06-22 NOTE — Progress Notes (Incomplete)
  Echocardiogram 2D Echocardiogram has been performed.  Cody Henson 06/22/2020, 1:45 PM

## 2020-06-22 NOTE — Telephone Encounter (Signed)
Patient at hospital for procedure

## 2020-06-22 NOTE — Progress Notes (Signed)
Dr. Burt Knack paged again at 1055 due to patient continuously oozing from femoral site. Tessa cath lab staff holding continuous pressure. Dr. Burt Knack ordered lidocaine with epinephrine and instructed primary RN to page structural heart PA. Angelena Form, PA-C paged and arrived to the unit at 1120. Lidocaine with epinephrine obtained. 67mLs lido-epi injected into proximal puncture site by [employer]. Pressure held for 5 minutes post injection. Proximal site stopped oozing after injection. Distal puncture site injected with 36ml of lidocaine by [facility]. Pressure held for another 5 minutes and oozing stopped.   Dr. Burt Knack arrived at the bedside at 1130 to check on patient. Patient remains stable and denies tenderness at site at this time. Bedrest re-started per Dr. Burt Knack order and will end at 1345.

## 2020-06-23 ENCOUNTER — Ambulatory Visit: Payer: BC Managed Care – PPO | Admitting: Occupational Therapy

## 2020-06-23 ENCOUNTER — Encounter (HOSPITAL_COMMUNITY): Payer: Self-pay | Admitting: Cardiovascular Disease

## 2020-06-23 MED FILL — Cefazolin Sodium-Dextrose IV Solution 2 GM/100ML-4%: INTRAVENOUS | Qty: 100 | Status: AC

## 2020-06-27 ENCOUNTER — Encounter: Payer: BC Managed Care – PPO | Admitting: Speech Pathology

## 2020-06-27 ENCOUNTER — Ambulatory Visit: Payer: BC Managed Care – PPO

## 2020-06-29 ENCOUNTER — Other Ambulatory Visit: Payer: Self-pay

## 2020-06-29 ENCOUNTER — Encounter: Payer: Self-pay | Admitting: Occupational Therapy

## 2020-06-29 ENCOUNTER — Ambulatory Visit: Payer: BC Managed Care – PPO | Attending: Physical Medicine and Rehabilitation | Admitting: Occupational Therapy

## 2020-06-29 DIAGNOSIS — R278 Other lack of coordination: Secondary | ICD-10-CM | POA: Insufficient documentation

## 2020-06-29 DIAGNOSIS — M6281 Muscle weakness (generalized): Secondary | ICD-10-CM | POA: Diagnosis not present

## 2020-06-29 DIAGNOSIS — I69359 Hemiplegia and hemiparesis following cerebral infarction affecting unspecified side: Secondary | ICD-10-CM | POA: Diagnosis not present

## 2020-06-29 NOTE — Patient Outreach (Signed)
[facility]) Care Management  06/29/2020  Autry Prust [birth year] [phone]   First telephone outreach attempt to obtain mRS. No answer. Left message for returned call.  Philmore Pali [facility] Management Assistant [phone]

## 2020-06-29 NOTE — Therapy (Signed)
[place] MAIN [facility] SERVICES [address] [place], [place], [address] Phone: [phone]   Fax:  [phone]  Occupational Therapy Treatment  Patient Details  Name: Cody Henson MRN: [id number] Date of Birth: 08-20-1975 No data recorded  Encounter Date: 06/29/2020   OT End of Session - 07/01/20 1415    Visit Number 14    Number of Visits 24    Date for OT Re-Evaluation 07/05/20    OT Start Time 0931    OT Stop Time 1015    OT Time Calculation (min) 44 min    Activity Tolerance Patient tolerated treatment well    Behavior During Therapy [facility] for tasks assessed/performed           Past Medical History:  Diagnosis Date  . Allergy    Phreesia 04/10/2020  . Depressed   . Hyperlipidemia    Phreesia 04/10/2020  . Hypertension    Phreesia 04/10/2020  . Neuromuscular disorder ([place])    Phreesia 04/10/2020  . Stroke [facility])    Phreesia 04/10/2020    Past Surgical History:  Procedure Laterality Date  . BUBBLE STUDY  03/21/2020   Procedure: BUBBLE STUDY;  Surgeon: [name], MD;  Location: [facility] ENDOSCOPY;  Service: Cardiology;;  . PATENT FORAMEN OVALE(PFO) CLOSURE N/A 06/22/2020   Procedure: PATENT FORAMEN OVALE (PFO) CLOSURE;  Surgeon: Sherren Mocha, MD;  Location: [place] CV LAB;  Service: Cardiovascular;  Laterality: N/A;  . TEE WITHOUT CARDIOVERSION N/A 03/21/2020   Procedure: TRANSESOPHAGEAL ECHOCARDIOGRAM (TEE);  Surgeon: [name], MD;  Location: [place];  Service: Cardiology;  Laterality: N/A;    There were no vitals filed for this visit.   Subjective Assessment - 06/30/20 1415    Subjective  Pt reports he has been busy and was out of town, had the procedure to close the hole in his heart, on May 4, went well but he did have some hives for a few days after, not sure if it was from anesthesia.    Pertinent History Per medial records, Cody Henson is a 45 y.o. male with history of depression who was admitted on  03/16/20 with right sided weakness, facial weakness and aphasia. CT head showed evolving hypodensity in mid/posterior left frontal lobe and CTA was negative for LVO. UDS showed THC. He received tPA and follow up MRI showed patchy multifocal acute ischemic infarcts in left cerebral hemisphere without hemorrhagic transformation.    Patient Stated Goals Pt would like to be as independent as possible, wants to drive and return to work.    Currently in Pain? No/denies    Pain Score 0-No pain          Neuromuscular reeducation:  Patient seen for manipulation of [place] discs, focus on R UE for flipping and turning, use of bilateral UE to attempt simultaneous performance.   Manipulation of small Glass beads from flat surface, difficulty with using hand for storage and utilizing thumb to move to finger tips. Increased focus on thumb movements for manipulation skills.   Use of Golf ball with focus to push across the MP joints with emphasis on thumb ABD/ADD, oppositional movements, move ball up and down fingers, radial side and ulnar side.  Pt. making perch with hand to hold ball (hand is like a golf tee to place ball on), added placing on a wrist bracelet/cuff which requires similar hand positioning, therapist demo and cues for each movement pattern.     Response to tx: Pt continues to  demonstrate improvements in all areas, right hand improving with coordination skills, decreased apraxia with manipulation skills.  Difficulty with tasks involving right using hand for storage and with translatory skills of the hand.  Difficulty with separation of hand skills from ulnar and radial side of the hand.  Instructed on exercises with use of golf ball or ping pong ball for hand skills above.  Continue OT to maximize safety and independence in necessary daily tasks.                 OT Education - 06/30/20 1415    Education Details RUE functional use, HEP, [place] home exercises    Person(s) Educated  Patient    Methods Explanation;Demonstration    Comprehension Verbalized understanding;Returned demonstration               OT Long Term Goals - 05/12/20 1215      OT LONG TERM GOAL #1   Title Pt will be independent with home exercise program    Baseline no program at eval, 05/11/20: ongoing changes to HEP    Time 12    Period Weeks    Status On-going    Target Date 07/05/20      OT LONG TERM GOAL #2   Title Pt will complete dressing skills, UE and LE with modified independence    Baseline difficulty with socks and shoes at times, unable to tie laces.  05/11/2020: Progressing occasional difficulty with tying laces.    Time 6    Period Weeks    Status On-going    Target Date 05/24/20      OT LONG TERM GOAL #3   Title Pt will improve right grip strength by 15# to assist with cutting meat with modified independence.    Baseline Eval unable, 05/11/2020: Difficulty with cutting meat secondary to decreased sensation however grip strength has improved dramatically.    Time 12    Period Weeks    Status Partially Met    Target Date 07/05/20      OT LONG TERM GOAL #4   Title Pt will improve right hand coordination to brush teeth without difficulty.    Baseline Eval:has to use left hand currently, 05/11/2020: Tasks still uncoordinated    Time 6    Period Weeks    Status On-going    Target Date 05/24/20      OT LONG TERM GOAL #5   Title Patient will improve typing speed greater than 12 WPM to answer emails and perform work tasks.    Baseline Eval:  adjust WPM 4.05/11/2020: Partially met is improved to 8 words a minute    Time 12    Period Weeks    Status Partially Met    Target Date 07/05/20      OT LONG TERM GOAL #6   Title Patient will improve strength in right hand to pick up 24 yo daughter from high chair.    Baseline unable at eval    Time 12    Period Weeks    Status Partially Met    Target Date 07/05/20      OT LONG TERM GOAL #7   Title Patient will improve hand  function to write and sign his name on important papers with legibilty greater than 50%    Baseline Eval: 25%, poor legibility, 05/11/2020 still working on improving legibility    Time 12    Period Weeks    Status On-going    Target Date 07/05/20  OT LONG TERM GOAL #8   Title Pt will improve FOTO score to 65 or greater to show clinically relevant change to engage in ADL and IADL tasks with greater independence.    Baseline Eval 51    Time 12    Period Weeks    Status On-going    Target Date 07/05/20                 Plan - 06/30/20 1416    Clinical Impression Statement Pt continues to demonstrate improvements in all areas, right hand improving with coordination skills, decreased apraxia with manipulation skills.  Difficulty with tasks involving right using hand for storage and with translatory skills of the hand.  Difficulty with separation of hand skills from ulnar and radial side of the hand.  Instructed on exercises with use of golf ball or ping pong ball for hand skills above.  Continue OT to maximize safety and independence in necessary daily tasks.    OT Occupational Profile and History Detailed Assessment- Review of Records and additional review of physical, cognitive, psychosocial history related to current functional performance    Occupational performance deficits (Please refer to evaluation for details): ADL's;IADL's;Work;Leisure;Social Participation    Body Structure / Function / Physical Skills ADL;Dexterity;Strength;Balance;Coordination;FMC;IADL;Endurance;Sensation;UE functional use;Proprioception    Psychosocial Skills Environmental  Adaptations;Habits;Routines and Behaviors    Rehab Potential Good    Clinical Decision Making Several treatment options, min-mod task modification necessary    Comorbidities Affecting Occupational Performance: May have comorbidities impacting occupational performance    Modification or Assistance to Complete Evaluation  No modification  of tasks or assist necessary to complete eval    OT Frequency 2x / week    OT Duration 12 weeks    OT Treatment/Interventions Self-care/ADL training;Cryotherapy;Therapeutic exercise;DME and/or AE instruction;Balance training;Neuromuscular education;Manual Therapy;Moist Heat;Contrast Bath;Therapeutic activities;Patient/family education    Consulted and Agree with Plan of Care Patient           Patient will benefit from skilled therapeutic intervention in order to improve the following deficits and impairments:   Body Structure / Function / Physical Skills: ADL,Dexterity,Strength,Balance,Coordination,FMC,IADL,Endurance,Sensation,UE functional use,Proprioception   Psychosocial Skills: Environmental  Adaptations,Habits,Routines and Behaviors   Visit Diagnosis: Muscle weakness (generalized)  Other lack of coordination  Hemiparesis affecting dominant side as late effect of stroke [facility])    Problem List Patient Active Problem List   Diagnosis Date Noted  . PFO (patent foramen ovale) 06/22/2020  . Transaminitis   . Hemiparesis affecting dominant side as late effect of stroke ([place])   . Blood pressure increase diastolic   . Left middle cerebral artery stroke ([place]) 03/23/2020  . Urinary retention   . Dyslipidemia   . Benign essential HTN   . Acute ischemic stroke ([place])   . Global aphasia   . Depression   . Hyperglycemia   . Right hemiparesis ([place])   . Aphasia   . Elevated blood pressure reading   . Stroke (cerebrum) ([place]) 03/17/2020  . Enthesopathy of hip region on both sides 01/26/2019  . Chronic right shoulder pain 12/10/2014  . Scapular dyskinesis 07/29/2014  . Bursitis of right shoulder 03/01/2014   Lamaria Hildebrandt T Tomasita Morrow, OTR/L, CLT [place] 07/01/2020, 5:21 PM  [place] MAIN [facility] SERVICES [address] [place], [place], [address] Phone: [phone]   Fax:  [phone]  Name: Davante Gerke MRN: [id number] Date of Birth: 1975/11/19

## 2020-07-04 ENCOUNTER — Encounter: Payer: BC Managed Care – PPO | Admitting: Speech Pathology

## 2020-07-04 ENCOUNTER — Ambulatory Visit: Payer: BC Managed Care – PPO

## 2020-07-04 ENCOUNTER — Other Ambulatory Visit: Payer: Self-pay

## 2020-07-04 NOTE — Patient Outreach (Signed)
[place] [facility]) Care Management  07/04/2020  Cody Henson [birth year] [phone]   3 outreach attempts were completed to obtain mRs. mRs could not be obtained because patient never returned my calls. mRs=7    [place] Management Assistant [phone]

## 2020-07-06 ENCOUNTER — Other Ambulatory Visit: Payer: Self-pay

## 2020-07-06 ENCOUNTER — Ambulatory Visit: Payer: BC Managed Care – PPO | Admitting: Occupational Therapy

## 2020-07-06 ENCOUNTER — Encounter: Payer: Self-pay | Admitting: Occupational Therapy

## 2020-07-06 DIAGNOSIS — M6281 Muscle weakness (generalized): Secondary | ICD-10-CM | POA: Diagnosis not present

## 2020-07-06 DIAGNOSIS — R278 Other lack of coordination: Secondary | ICD-10-CM | POA: Diagnosis not present

## 2020-07-06 DIAGNOSIS — I69359 Hemiplegia and hemiparesis following cerebral infarction affecting unspecified side: Secondary | ICD-10-CM | POA: Diagnosis not present

## 2020-07-06 NOTE — Therapy (Signed)
[place] MAIN [facility] SERVICES [address] [place], [place], [address] Phone: [phone]   Fax:  [phone]  Occupational Therapy Treatment/Recertification Note  Patient Details  Name: Cody Henson MRN: [id number] Date of Birth: 1975-08-02 No data recorded  Encounter Date: 07/06/2020   OT End of Session - 07/06/20 0937    Visit Number 15    Number of Visits 24    Date for OT Re-Evaluation 09/28/20    OT Start Time 0930    OT Stop Time 1015    OT Time Calculation (min) 45 min    Activity Tolerance Patient tolerated treatment well    Behavior During Therapy [facility] for tasks assessed/performed           Past Medical History:  Diagnosis Date  . Allergy    Phreesia 04/10/2020  . Depressed   . Hyperlipidemia    Phreesia 04/10/2020  . Hypertension    Phreesia 04/10/2020  . Neuromuscular disorder ([place])    Phreesia 04/10/2020  . Stroke [facility])    Phreesia 04/10/2020    Past Surgical History:  Procedure Laterality Date  . BUBBLE STUDY  03/21/2020   Procedure: BUBBLE STUDY;  Surgeon: [name], MD;  Location: [facility] ENDOSCOPY;  Service: Cardiology;;  . PATENT FORAMEN OVALE(PFO) CLOSURE N/A 06/22/2020   Procedure: PATENT FORAMEN OVALE (PFO) CLOSURE;  Surgeon: Sherren Mocha, MD;  Location: [place] CV LAB;  Service: Cardiovascular;  Laterality: N/A;  . TEE WITHOUT CARDIOVERSION N/A 03/21/2020   Procedure: TRANSESOPHAGEAL ECHOCARDIOGRAM (TEE);  Surgeon: [name], MD;  Location: [place];  Service: Cardiology;  Laterality: N/A;    There were no vitals filed for this visit.   Subjective Assessment - 07/06/20 0935    Subjective  Pt reports he has been busy and was out of town, had the procedure to close the hole in his heart, on May 4, went well but he did have some hives for a few days after, not sure if it was from anesthesia.    Pertinent History Per medial records, Cody Henson is a 45 y.o. male with history of depression  who was admitted on 03/16/20 with right sided weakness, facial weakness and aphasia. CT head showed evolving hypodensity in mid/posterior left frontal lobe and CTA was negative for LVO. UDS showed THC. He received tPA and follow up MRI showed patchy multifocal acute ischemic infarcts in left cerebral hemisphere without hemorrhagic transformation.    Patient Stated Goals Pt would like to be as independent as possible, wants to drive and return to work.    Currently in Pain? No/denies              [facility] OT Assessment - 07/06/20 0001      Sensation   Light Touch Impaired by gross assessment    Stereognosis Impaired by gross assessment   Pt. unable to identify 2/2 objects with card cues.     Coordination   Right 9 Hole Peg Test 59 sec.      Strength   Overall Strength Comments 4+/5      Hand Function   Right Hand Grip (lbs) 118    Right Hand Lateral Pinch 26 lbs    Right Hand 3 Point Pinch 28 lbs          Measurements were obtained, and goals were reviewed with the pt. Pt. is making progress with RUE strength, grip strength, pinch strength, and [place] skills. Pt. is now able to perform self dressing skills  with increased time required. Pt. is able to lift his child out of her high chair, and brush his teeth. Pt. continues to present with decreased sensation in the right hand, impaired motor control, and [facility] skills. Pt. continues to work on improving left hand functioning in order to improve ADL, and IADL performance, and to be able to write legibly, type efficiently for work related tasks, and tie a knot in fishing line.                  OT Education - 07/06/20 0936    Education Details RUE functional use, HEP, [place] home exercises    Person(s) Educated Patient    Methods Explanation;Demonstration    Comprehension Verbalized understanding;Returned demonstration               OT Long Term Goals - 07/06/20 1001      OT LONG TERM GOAL #1   Title Pt will be  independent with home exercise program    Baseline no program at eval, 05/11/20: ongoing changes to HEP 07/06/2020: Independent with Home exercises. Exercises to be added in an ongoing basis.    Time 12    Period Weeks    Status On-going    Target Date 09/28/20      OT LONG TERM GOAL #2   Title Pt will complete dressing skills, UE and LE with modified independence    Baseline difficulty with socks and shoes at times, unable to tie laces.  05/11/2020: Progressing occasional difficulty with tying laces. 07/06/2020: Pt. is able to dress himself, and tie his shoes, however the process slow, and requires increased time.    Time 12    Target Date 09/28/20      OT LONG TERM GOAL #3   Title Pt will improve right grip strength by 15# to assist with cutting meat with modified independence.    Baseline Eval unable, 05/11/2020: Difficulty with cutting meat secondary to decreased sensation however grip strength has improved dramatically. 07/06/2020: Pt. is steadily progressing with grip strength.  Pt. is able to cutt meat now, however has difficulty cutting boxes.    Time 12    Period Weeks    Status Partially Met    Target Date 09/28/20      OT LONG TERM GOAL #4   Title Pt will improve right hand coordination to brush teeth without difficulty.    Baseline Eval:has to use left hand currently, 05/11/2020: Tasks still uncoordinated 07/06/2020: Pt. is independent with brushing his teeth.    Time 6    Period Weeks    Status Achieved      OT LONG TERM GOAL #5   Title Patient will improve typing speed greater than 12 WPM to answer emails and perform work tasks.    Baseline Eval:  adjust WPM 4.05/11/2020: Partially met is improved to 8 words a minute 07/06/2020: Pt. continues to present with limited typing speed.    Time 12    Period Weeks    Status Partially Met    Target Date 09/28/20      OT LONG TERM GOAL #6   Title Patient will improve strength in right hand to pick up 14 yo daughter from high chair.     Baseline unable at eval 07/06/2020: pt. is able to pick up his daughter from a high chair.    Time 12    Period Weeks    Status Achieved      OT LONG TERM GOAL #7  Title Patient will improve hand function to write and sign his name on important papers with legibilty greater than 50%    Baseline Eval: 25%, poor legibility, 05/11/2020 still working on improving legibility 07/06/2020: pt. continues to work on writing legibility    Time 12    Period Weeks    Status On-going    Target Date 09/28/20      OT LONG TERM GOAL #8   Title Pt will improve FOTO score to 65 or greater to show clinically relevant change to engage in ADL and IADL tasks with greater independence.    Baseline Eval 51; 07/05/2020: 59    Time 12    Period Weeks    Status On-going    Target Date 09/28/20      OT LONG TERM GOAL  #9   TITLE Pt. will be able to independently tie knot in fishing line.    Baseline 07/06/2020: Pt. is unable to tie a knot in fishing line.    Time 12    Period Weeks    Status New    Target Date 09/28/20                 Plan - 07/06/20 4401    Clinical Impression Statement Measurements were obtained, and goals were reviewed with the pt. Pt. is making progress with RUE strength, grip strength, pinch strength, and [place] skills. Pt. is now able to perform self dressing skills with increased time required. Pt. is able to lift his child out of her high chair, and brush his teeth. Pt. continues to present with decreased sensation in the right hand, impaired motor control, and [facility] skills. Pt. continues to work on improving left hand functioning in order to improve ADL, and IADL performance, and to be able to write legibly, type efficiently for work related tasks, and tie a knot in fishing line.   OT Occupational Profile and History Detailed Assessment- Review of Records and additional review of physical, cognitive, psychosocial history related to current functional performance    Occupational  performance deficits (Please refer to evaluation for details): ADL's;IADL's;Work;Leisure;Social Participation    Body Structure / Function / Physical Skills ADL;Dexterity;Strength;Balance;Coordination;FMC;IADL;Endurance;Sensation;UE functional use;Proprioception    Psychosocial Skills Environmental  Adaptations;Habits;Routines and Behaviors    Rehab Potential Good    Clinical Decision Making Several treatment options, min-mod task modification necessary    Comorbidities Affecting Occupational Performance: May have comorbidities impacting occupational performance    Modification or Assistance to Complete Evaluation  No modification of tasks or assist necessary to complete eval    OT Frequency 2x / week    OT Duration 12 weeks    OT Treatment/Interventions Self-care/ADL training;Cryotherapy;Therapeutic exercise;DME and/or AE instruction;Balance training;Neuromuscular education;Manual Therapy;Moist Heat;Contrast Bath;Therapeutic activities;Patient/family education    Consulted and Agree with Plan of Care Patient           Patient will benefit from skilled therapeutic intervention in order to improve the following deficits and impairments:   Body Structure / Function / Physical Skills: ADL,Dexterity,Strength,Balance,Coordination,FMC,IADL,Endurance,Sensation,UE functional use,Proprioception   Psychosocial Skills: Environmental  Adaptations,Habits,Routines and Behaviors   Visit Diagnosis: Muscle weakness (generalized)  Other lack of coordination    Problem List Patient Active Problem List   Diagnosis Date Noted  . PFO (patent foramen ovale) 06/22/2020  . Transaminitis   . Hemiparesis affecting dominant side as late effect of stroke ([place])   . Blood pressure increase diastolic   . Left middle cerebral artery stroke ([place]) 03/23/2020  . Urinary retention   .  Dyslipidemia   . Benign essential HTN   . Acute ischemic stroke ([place])   . Global aphasia   . Depression   . Hyperglycemia   .  Right hemiparesis ([place])   . Aphasia   . Elevated blood pressure reading   . Stroke (cerebrum) ([place]) 03/17/2020  . Enthesopathy of hip region on both sides 01/26/2019  . Chronic right shoulder pain 12/10/2014  . Scapular dyskinesis 07/29/2014  . Bursitis of right shoulder 03/01/2014    Harrel Carina, MS, OTR/L 07/06/2020, 12:22 PM  [place] MAIN [facility] SERVICES [address] [place], [place], [address] Phone: [phone]   Fax:  [phone]  Name: Cody Henson MRN: [id number] Date of Birth: 01/03/1976

## 2020-07-11 ENCOUNTER — Ambulatory Visit: Payer: BC Managed Care – PPO

## 2020-07-11 ENCOUNTER — Encounter: Payer: BC Managed Care – PPO | Admitting: Speech Pathology

## 2020-07-13 ENCOUNTER — Encounter: Payer: Self-pay | Admitting: Occupational Therapy

## 2020-07-13 ENCOUNTER — Other Ambulatory Visit: Payer: Self-pay

## 2020-07-13 ENCOUNTER — Ambulatory Visit: Payer: BC Managed Care – PPO | Admitting: Occupational Therapy

## 2020-07-13 DIAGNOSIS — R278 Other lack of coordination: Secondary | ICD-10-CM | POA: Diagnosis not present

## 2020-07-13 DIAGNOSIS — I69359 Hemiplegia and hemiparesis following cerebral infarction affecting unspecified side: Secondary | ICD-10-CM | POA: Diagnosis not present

## 2020-07-13 DIAGNOSIS — M6281 Muscle weakness (generalized): Secondary | ICD-10-CM

## 2020-07-13 NOTE — Therapy (Signed)
[place] MAIN [facility] SERVICES [address] [place], [place], [address] Phone: [phone]   Fax:  [phone]  Occupational Therapy Treatment  Patient Details  Name: Cody Henson MRN: [id number] Date of Birth: 27-May-1975 No data recorded  Encounter Date: 07/13/2020   OT End of Session - 07/13/20 0950    Visit Number 16    Number of Visits 24    Date for OT Re-Evaluation 09/28/20    OT Start Time 0929    OT Stop Time 1014    OT Time Calculation (min) 45 min    Activity Tolerance Patient tolerated treatment well    Behavior During Therapy [facility] for tasks assessed/performed           Past Medical History:  Diagnosis Date  . Allergy    Phreesia 04/10/2020  . Depressed   . Hyperlipidemia    Phreesia 04/10/2020  . Hypertension    Phreesia 04/10/2020  . Neuromuscular disorder ([place])    Phreesia 04/10/2020  . Stroke [facility])    Phreesia 04/10/2020    Past Surgical History:  Procedure Laterality Date  . BUBBLE STUDY  03/21/2020   Procedure: BUBBLE STUDY;  Surgeon: [name], MD;  Location: [facility] ENDOSCOPY;  Service: Cardiology;;  . PATENT FORAMEN OVALE(PFO) CLOSURE N/A 06/22/2020   Procedure: PATENT FORAMEN OVALE (PFO) CLOSURE;  Surgeon: Sherren Mocha, MD;  Location: [place] CV LAB;  Service: Cardiovascular;  Laterality: N/A;  . TEE WITHOUT CARDIOVERSION N/A 03/21/2020   Procedure: TRANSESOPHAGEAL ECHOCARDIOGRAM (TEE);  Surgeon: [name], MD;  Location: [place];  Service: Cardiology;  Laterality: N/A;    There were no vitals filed for this visit.   Subjective Assessment - 07/13/20 0932    Subjective  Pt reports difficulty opening ice cream containers.    Pertinent History Per medial records, Cody Henson is a 45 y.o. male with history of depression who was admitted on 03/16/20 with right sided weakness, facial weakness and aphasia. CT head showed evolving hypodensity in mid/posterior left frontal lobe and CTA was  negative for LVO. UDS showed THC. He received tPA and follow up MRI showed patchy multifocal acute ischemic infarcts in left cerebral hemisphere without hemorrhagic transformation.    Patient Stated Goals Pt would like to be as independent as possible, wants to drive and return to work.    Currently in Pain? No/denies              Self Care Pt worked on Media planner and speed. Pt was able to write 58 letter sentence in 1'28", 53 letter sentence in 1'05", 33 letter sentence in 41", and 31 letter sentence in 36". Pt used a mature hand grasp on the pen using large "the pencil grip." Pt was able to hold the pen for the entire exercise. Fair letter spacing, good line spacing, and 25% legibility.    Neuromuscular Re-educatoin Pt worked on [facility] skills using tweezers to place 1/4" hollow beads onto pegs. Pt worked on sustaining grasp on the resistive tweezers while  moving the beads from a horizontal position to a vertical position to prepare for placing them into the pegboard. Pt required verbal cues, and cues for visual demonstration for wrist position, and hand pattern when placing them into the pegboard.                   OT Education - 07/13/20 0936    Education Details RUE functional use, HEP, Numa home exercises    Person(s)  Educated Patient    Methods Explanation;Demonstration    Comprehension Verbalized understanding;Returned demonstration               OT Long Term Goals - 07/06/20 1001      OT LONG TERM GOAL #1   Title Pt will be independent with home exercise program    Baseline no program at eval, 05/11/20: ongoing changes to HEP 07/06/2020: Independent with Home exercises. Exercises to be added in an ongoing basis.    Time 12    Period Weeks    Status On-going    Target Date 09/28/20      OT LONG TERM GOAL #2   Title Pt will complete dressing skills, UE and LE with modified independence    Baseline difficulty with socks and shoes at times, unable to  tie laces.  05/11/2020: Progressing occasional difficulty with tying laces. 07/06/2020: Pt. is able to dress himself, and tie his shoes, however the process slow, and requires increased time.    Time 12    Target Date 09/28/20      OT LONG TERM GOAL #3   Title Pt will improve right grip strength by 15# to assist with cutting meat with modified independence.    Baseline Eval unable, 05/11/2020: Difficulty with cutting meat secondary to decreased sensation however grip strength has improved dramatically. 07/06/2020: Pt. is steadily progressing with grip strength.  Pt. is able to cutt meat now, however has difficulty cutting boxes.    Time 12    Period Weeks    Status Partially Met    Target Date 09/28/20      OT LONG TERM GOAL #4   Title Pt will improve right hand coordination to brush teeth without difficulty.    Baseline Eval:has to use left hand currently, 05/11/2020: Tasks still uncoordinated 07/06/2020: Pt. is independent with brushing his teeth.    Time 6    Period Weeks    Status Achieved      OT LONG TERM GOAL #5   Title Patient will improve typing speed greater than 12 WPM to answer emails and perform work tasks.    Baseline Eval:  adjust WPM 4.05/11/2020: Partially met is improved to 8 words a minute 07/06/2020: Pt. continues to present with limited typing speed.    Time 12    Period Weeks    Status Partially Met    Target Date 09/28/20      OT LONG TERM GOAL #6   Title Patient will improve strength in right hand to pick up 3 yo daughter from high chair.    Baseline unable at eval 07/06/2020: pt. is able to pick up his daughter from a high chair.    Time 12    Period Weeks    Status Achieved      OT LONG TERM GOAL #7   Title Patient will improve hand function to write and sign his name on important papers with legibilty greater than 50%    Baseline Eval: 25%, poor legibility, 05/11/2020 still working on improving legibility 07/06/2020: pt. continues to work on writing legibility     Time 12    Period Weeks    Status On-going    Target Date 09/28/20      OT LONG TERM GOAL #8   Title Pt will improve FOTO score to 65 or greater to show clinically relevant change to engage in ADL and IADL tasks with greater independence.    Baseline Eval 51; 07/05/2020: 59  Time 12    Period Weeks    Status On-going    Target Date 09/28/20      OT LONG TERM GOAL  #9   TITLE Pt. will be able to independently tie knot in fishing line.    Baseline 07/06/2020: Pt. is unable to tie a knot in fishing line.    Time 12    Period Weeks    Status New    Target Date 09/28/20                 Plan - 07/13/20 0955    Clinical Impression Statement Pt continues to demonstrate improvements in all areas, right hand improving with coordination skills, decreased apraxia with manipulation skills. Pt demonstrates improve writing speed but continues to demonstrate ~25% legibility. Pt continues to work on improving left hand functioning in order to improve ADL, and IADL performance, and to be able to write legibly, type efficiently for work related tasks, and tie a knot in fishing line.    OT Occupational Profile and History Detailed Assessment- Review of Records and additional review of physical, cognitive, psychosocial history related to current functional performance    Occupational performance deficits (Please refer to evaluation for details): ADL's;IADL's;Work;Leisure;Social Participation    Body Structure / Function / Physical Skills ADL;Dexterity;Strength;Balance;Coordination;FMC;IADL;Endurance;Sensation;UE functional use;Proprioception    Psychosocial Skills Environmental  Adaptations;Habits;Routines and Behaviors    Rehab Potential Good    Clinical Decision Making Several treatment options, min-mod task modification necessary    Comorbidities Affecting Occupational Performance: May have comorbidities impacting occupational performance    Modification or Assistance to Complete Evaluation  No  modification of tasks or assist necessary to complete eval    OT Frequency 2x / week    OT Duration 12 weeks    OT Treatment/Interventions Self-care/ADL training;Cryotherapy;Therapeutic exercise;DME and/or AE instruction;Balance training;Neuromuscular education;Manual Therapy;Moist Heat;Contrast Bath;Therapeutic activities;Patient/family education    Consulted and Agree with Plan of Care Patient           Patient will benefit from skilled therapeutic intervention in order to improve the following deficits and impairments:   Body Structure / Function / Physical Skills: ADL,Dexterity,Strength,Balance,Coordination,FMC,IADL,Endurance,Sensation,UE functional use,Proprioception   Psychosocial Skills: Environmental  Adaptations,Habits,Routines and Behaviors   Visit Diagnosis: Muscle weakness (generalized)  Other lack of coordination    Problem List Patient Active Problem List   Diagnosis Date Noted  . PFO (patent foramen ovale) 06/22/2020  . Transaminitis   . Hemiparesis affecting dominant side as late effect of stroke ([place])   . Blood pressure increase diastolic   . Left middle cerebral artery stroke ([place]) 03/23/2020  . Urinary retention   . Dyslipidemia   . Benign essential HTN   . Acute ischemic stroke ([place])   . Global aphasia   . Depression   . Hyperglycemia   . Right hemiparesis ([place])   . Aphasia   . Elevated blood pressure reading   . Stroke (cerebrum) ([place]) 03/17/2020  . Enthesopathy of hip region on both sides 01/26/2019  . Chronic right shoulder pain 12/10/2014  . Scapular dyskinesis 07/29/2014  . Bursitis of right shoulder 03/01/2014     Dessie Coma, M.S. OTR/L  07/13/20, 10:09 AM  ascom [phone]  [place] MAIN [facility] SERVICES [address] [place], [place], [address] Phone: [phone]   Fax:  [phone]  Name: Cody Henson MRN: [id number] Date of Birth: 1975/11/13

## 2020-07-19 ENCOUNTER — Encounter: Payer: BC Managed Care – PPO | Admitting: Speech Pathology

## 2020-07-19 NOTE — Progress Notes (Deleted)
HEART AND [place]                                     Cardiology Office Note:    Date:  07/19/2020   ID:  Cody Henson, DOB 01-22-1976, MRN [id number]  PCP:  Cody Herter, NP  [facility] Cardiologist:  None  [facility] Electrophysiologist:  None   Referring MD: Cody Herter, NP   1 month s/p PFO closure   History of Present Illness:    Cody Henson is a 44 y.o. male with a hx of cryptogenic CVA and PFO s/p PFO closure (06/22/20) who presents to clinic for follow up.   On March 16, 2020, he developed acute onset of right hemiparesis, facial droop, and aphasia.  He was found to have an acute left MCA stroke without hemorrhage. The patient was treated with TPA. Follow-up MRI study showed mild scattered petechial hemorrhage but no frank hemorrhagic transformation. CTA study showed no evidence of large vessel vascular disease.  Bilateral venous Doppler studies were negative. Hypercoagulability panel was negative.  Transesophageal echo study demonstrated PFO with right to left shunting confirmed by agitated saline study. TCD bubble study was positive. He was seen by Dr. Burt Henson in 04/2020 for evaluation of percutaneous PFO closure. He was felt to be a good candidate for this.    He underwent successful transcatheter PFO closure using a 14 mm Amplatzer atrial septal occluder pn 06/22/20. Post op echo showed stable  position with no residual PFO/ASD.   Today he presents to clinic for follow up.    Past Medical History:  Diagnosis Date  . Allergy    Phreesia 04/10/2020  . Depressed   . Hyperlipidemia    Phreesia 04/10/2020  . Hypertension    Phreesia 04/10/2020  . Neuromuscular disorder ([place])    Phreesia 04/10/2020  . Stroke [facility])    Phreesia 04/10/2020    Past Surgical History:  Procedure Laterality Date  . BUBBLE STUDY  03/21/2020   Procedure: BUBBLE STUDY;  Surgeon: Cody Munroe, MD;  Location: [facility] ENDOSCOPY;  Service:  Cardiology;;  . PATENT FORAMEN OVALE(PFO) CLOSURE N/A 06/22/2020   Procedure: PATENT FORAMEN OVALE (PFO) CLOSURE;  Surgeon: Cody Mocha, MD;  Location: [place] CV LAB;  Service: Cardiovascular;  Laterality: N/A;  . TEE WITHOUT CARDIOVERSION N/A 03/21/2020   Procedure: TRANSESOPHAGEAL ECHOCARDIOGRAM (TEE);  Surgeon: Cody Munroe, MD;  Location: [place];  Service: Cardiology;  Laterality: N/A;    Current Medications: No outpatient medications have been marked as taking for the 07/20/20 encounter (Appointment) with Cody Stanford, PA-C.     Allergies:   Albumen, egg; Other; and Eggs or egg-derived products   Social History   Socioeconomic History  . Marital status: Married    Spouse name: Not on file  . Number of children: Not on file  . Years of education: Not on file  . Highest education level: Not on file  Occupational History  . Not on file  Tobacco Use  . Smoking status: Never Smoker  . Smokeless tobacco: Never Used  Substance and Sexual Activity  . Alcohol use: Not on file  . Drug use: Not on file  . Sexual activity: Not on file  Other Topics Concern  . Not on file  Social History Narrative  . Not on file   Social Determinants of Health  Financial Resource Strain: Not on file  Food Insecurity: Not on file  Transportation Needs: Not on file  Physical Activity: Not on file  Stress: Not on file  Social Connections: Not on file     Family History: The patient's family history includes Stroke in his maternal aunt and paternal grandfather.  ROS:   Please see the history of present illness.    All other systems reviewed and are negative.  EKGs/Labs/Other Studies Reviewed:    The following studies were reviewed today:   TCD Bubble Study: Between 10 to 15 HITS (High Intensity Transient Signals) were heard at  rest, indicating a Cody Henson Grade 2 PFO (Patent Foramen Ovale) at rest.  Between 15 to 50 HITS were heard during valsalva, indicating a  Cody Henson  Grade 3 PFO with valsalva maneuver.   __________________  03/21/20 TEE: IMPRESSIONS  1. Evidence of atrial level shunting detected by color flow Doppler.  Agitated saline contrast bubble study was positive with shunting observed  within 3-6 cardiac cycles suggestive of interatrial shunt.  2. Left ventricular ejection fraction, by estimation, is 65 to 70%. The  left ventricle has normal function. The left ventricle has no regional  wall motion abnormalities.  3. Right ventricular systolic function is normal. The right ventricular  size is normal.  4. No left atrial/left atrial appendage thrombus was detected.  5. The mitral valve is normal in structure. Trivial mitral valve  regurgitation. No evidence of mitral stenosis.  6. The aortic valve is normal in structure. Aortic valve regurgitation is  not visualized. No aortic stenosis is present.  7. Aortic dilatation noted. There is borderline dilatation of the aortic  root, measuring 38 mm.   Conclusion(s)/Recommendation(s): Normal biventricular function without  evidence of hemodynamically significant valvular heart disease. PFO  present with right to left shunt.   _________________________   06/22/20 PATENT FORAMEN OVALE (PFO) CLOSURE  Conclusion Successful transcatheter PFO closure using a 14 mm Amplatzer atrial septal occluder under intracardiac echo and fluoroscopic guidance, guided with balloon-sizing  _________________________  06/22/20 IMPRESSIONS  1. Limited study: Post PFO closure with 14 mm [place] Occluder. Stable  position with no residual PFO/ASD.    EKG:  EKG is NOT ordered today  Recent Labs: 05/17/2020: ALT 41; TSH 1.620 06/16/2020: BUN 13; Creatinine, Ser 1.15; Hemoglobin 15.2; Platelets 271; Potassium 4.7; Sodium 142  Recent Lipid Panel    Component Value Date/Time   CHOL 170 05/17/2020 1025   TRIG 169 (H) 05/17/2020 1025   HDL 35 (L) 05/17/2020 1025   CHOLHDL 4.9 05/17/2020 1025    CHOLHDL 5.3 03/17/2020 0344   VLDL 23 03/17/2020 0344   LDLCALC 105 (H) 05/17/2020 1025     Risk Assessment/Calculations:       Physical Exam:    VS:  There were no vitals taken for this visit.    Wt Readings from Last 3 Encounters:  06/22/20 155 lb (70.3 kg)  06/16/20 155 lb (70.3 kg)  06/15/20 156 lb 9.6 oz (71 kg)     GEN:  Well nourished, well developed in no acute distress HEENT: Normal NECK: No JVD; No carotid bruits LYMPHATICS: No lymphadenopathy CARDIAC: RRR, no murmurs, rubs, gallops RESPIRATORY:  Clear to auscultation without rales, wheezing or rhonchi  ABDOMEN: Soft, non-tender, non-distended MUSCULOSKELETAL:  No edema; No deformity  SKIN: Warm and dry NEUROLOGIC:  Alert and oriented x 3 PSYCHIATRIC:  Normal affect   ASSESSMENT:    1. S/P percutaneous patent foramen ovale closure    PLAN:  In order of problems listed above:  S/p PFO s/p PFO closure:     Medication Adjustments/Labs and Tests Ordered: Current medicines are reviewed at length with the patient today.  Concerns regarding medicines are outlined above.  No orders of the defined types were placed in this encounter.  No orders of the defined types were placed in this encounter.   There are no Patient Instructions on file for this visit.   Signed, Angelena Form, PA-C  07/19/2020 12:48 PM    [facility]

## 2020-07-20 ENCOUNTER — Ambulatory Visit: Payer: BC Managed Care – PPO | Admitting: Physician Assistant

## 2020-07-20 ENCOUNTER — Ambulatory Visit: Payer: BC Managed Care – PPO | Admitting: Occupational Therapy

## 2020-07-20 ENCOUNTER — Ambulatory Visit: Payer: BC Managed Care – PPO

## 2020-07-27 ENCOUNTER — Other Ambulatory Visit: Payer: Self-pay

## 2020-07-27 ENCOUNTER — Encounter: Payer: Self-pay | Admitting: Occupational Therapy

## 2020-07-27 ENCOUNTER — Ambulatory Visit: Payer: BC Managed Care – PPO | Attending: Physical Medicine and Rehabilitation | Admitting: Occupational Therapy

## 2020-07-27 DIAGNOSIS — R278 Other lack of coordination: Secondary | ICD-10-CM

## 2020-07-27 DIAGNOSIS — R482 Apraxia: Secondary | ICD-10-CM | POA: Diagnosis not present

## 2020-07-27 DIAGNOSIS — I69359 Hemiplegia and hemiparesis following cerebral infarction affecting unspecified side: Secondary | ICD-10-CM | POA: Diagnosis not present

## 2020-07-27 DIAGNOSIS — M6281 Muscle weakness (generalized): Secondary | ICD-10-CM | POA: Diagnosis not present

## 2020-07-28 NOTE — Therapy (Signed)
[place] MAIN [facility] SERVICES [address] [place], [place], [address] Phone: [phone]   Fax:  [phone]  Occupational Therapy Treatment  Patient Details  Name: Cody Henson MRN: [id number] Date of Birth: [birth year] No data recorded  Encounter Date: 07/27/2020   OT End of Session - 07/28/20 0756     Visit Number 17    Number of Visits 24    Date for OT Re-Evaluation 09/28/20    OT Start Time 0915    OT Stop Time 1000    OT Time Calculation (min) 45 min    Activity Tolerance Patient tolerated treatment well    Behavior During Therapy [facility] for tasks assessed/performed             Past Medical History:  Diagnosis Date   Allergy    Phreesia 04/10/2020   Depressed    Hyperlipidemia    Phreesia 04/10/2020   Hypertension    Phreesia 04/10/2020   Neuromuscular disorder ([place])    Phreesia 04/10/2020   Stroke [facility])    Phreesia 04/10/2020    Past Surgical History:  Procedure Laterality Date   BUBBLE STUDY  03/21/2020   Procedure: BUBBLE STUDY;  Surgeon: [name], MD;  Location: [facility] ENDOSCOPY;  Service: Cardiology;;   PATENT FORAMEN OVALE(PFO) CLOSURE N/A 06/22/2020   Procedure: PATENT FORAMEN OVALE (PFO) CLOSURE;  Surgeon: Sherren Mocha, MD;  Location: [facility] CV LAB;  Service: Cardiovascular;  Laterality: N/A;   TEE WITHOUT CARDIOVERSION N/A 03/21/2020   Procedure: TRANSESOPHAGEAL ECHOCARDIOGRAM (TEE);  Surgeon: [name], MD;  Location: [facility];  Service: Cardiology;  Laterality: N/A;    There were no vitals filed for this visit.   Subjective Assessment - 07/27/20 0918     Subjective  Pt.  reports being able to open ketchup packets now, buttons are improving still slow.    Pertinent History Per medial records, Cody Henson is a 45 y.o. male with history of depression who was admitted on 03/16/20 with right sided weakness, facial weakness and aphasia. CT head showed evolving hypodensity in mid/posterior  left frontal lobe and CTA was negative for LVO. UDS showed THC. He received tPA and follow up MRI showed patchy multifocal acute ischemic infarcts in left cerebral hemisphere without hemorrhagic transformation.            Pt reports they had their baby girl on June 3rd , she has jaundice and has had to stay in the hospital so they have been staying overnight there. Family is helping to watch their other daughter.   Pt reports difficulty with coordination in his right hand to use tools in the kitchen such as a knife to cut food on his plate and also with trying to scoop ice cream.  ADL: Educated patient on use of Hold and slice tool to safely stabilize item he is cutting and then using built up handled knife with cutting (used red theraputty for item to cut since it is the consistency of steak/chicken), cues for type of grip to utilize.  Pt tends to use thumb on top of the knife, switched to thumb on the side and index finger on top to help stabilize, pt with difficulty but improved after multiple trials performed.  Pt has red foam at home to add to his butter knife to work on at home.  Otherwise also discussed use of a built up rocker knife as an option.  Pt instructed on use of Built up spoon for using  as a scoop when attempting to manage ice cream, he demonstrated ability with multiple reps, trials with "scooping" of resistive putty in the clinic with occasional cues for hand grip and motion. Use of Ice cream scoop however appears to large for control of item. Also instructed on use of Melon scoop which is smaller for items to scoop and has a different type of handle for gripping.  Able to demonstrate with mild difficulty and multiple adjustments to gripping pattern.    Response to tx:  Patient has continued to make excellent progress with right UE use.  Has a new baby now and has been holding the baby but mostly with his left UE and using a football hold for safety.  He reports difficulty with  select kitchen tools and therefore we focused on gripping patterns, use of a variety of tools and ways to adjust, vary the use of tools for tasks in the kitchen.  Patient demonstrated difficulty with coordination of gripping especially with use of butter knife, recommend built up handles when using kitchen tools for best result and improved grip and control.  Instructed on set of built up tools on [employer] which also includes a rocker knife.  Continue to work towards goals in plan of care to improve right hand functional use for daily tasks at home, work and in the community.                       OT Education - 07/28/20 0755     Education Details use of built up handles for kitchen tasks, hold and slice tool    Person(s) Educated Patient    Methods Explanation;Demonstration    Comprehension Verbalized understanding;Returned demonstration                 OT Long Term Goals - 07/06/20 1001       OT LONG TERM GOAL #1   Title Pt will be independent with home exercise program    Baseline no program at eval, 05/11/20: ongoing changes to HEP 07/06/2020: Independent with Home exercises. Exercises to be added in an ongoing basis.    Time 12    Period Weeks    Status On-going    Target Date 09/28/20      OT LONG TERM GOAL #2   Title Pt will complete dressing skills, UE and LE with modified independence    Baseline difficulty with socks and shoes at times, unable to tie laces.  05/11/2020: Progressing occasional difficulty with tying laces. 07/06/2020: Pt. is able to dress himself, and tie his shoes, however the process slow, and requires increased time.    Time 12    Target Date 09/28/20      OT LONG TERM GOAL #3   Title Pt will improve right grip strength by 15# to assist with cutting meat with modified independence.    Baseline Eval unable, 05/11/2020: Difficulty with cutting meat secondary to decreased sensation however grip strength has improved dramatically. 07/06/2020:  Pt. is steadily progressing with grip strength.  Pt. is able to cutt meat now, however has difficulty cutting boxes.    Time 12    Period Weeks    Status Partially Met    Target Date 09/28/20      OT LONG TERM GOAL #4   Title Pt will improve right hand coordination to brush teeth without difficulty.    Baseline Eval:has to use left hand currently, 05/11/2020: Tasks still uncoordinated 07/06/2020: Pt. is independent  with brushing his teeth.    Time 6    Period Weeks    Status Achieved      OT LONG TERM GOAL #5   Title Patient will improve typing speed greater than 12 WPM to answer emails and perform work tasks.    Baseline Eval:  adjust WPM 4.05/11/2020: Partially met is improved to 8 words a minute 07/06/2020: Pt. continues to present with limited typing speed.    Time 12    Period Weeks    Status Partially Met    Target Date 09/28/20      OT LONG TERM GOAL #6   Title Patient will improve strength in right hand to pick up 53 yo daughter from high chair.    Baseline unable at eval 07/06/2020: pt. is able to pick up his daughter from a high chair.    Time 12    Period Weeks    Status Achieved      OT LONG TERM GOAL #7   Title Patient will improve hand function to write and sign his name on important papers with legibilty greater than 50%    Baseline Eval: 25%, poor legibility, 05/11/2020 still working on improving legibility 07/06/2020: pt. continues to work on writing legibility    Time 12    Period Weeks    Status On-going    Target Date 09/28/20      OT LONG TERM GOAL #8   Title Pt will improve FOTO score to 65 or greater to show clinically relevant change to engage in ADL and IADL tasks with greater independence.    Baseline Eval 51; 07/05/2020: 59    Time 12    Period Weeks    Status On-going    Target Date 09/28/20      OT LONG TERM GOAL  #9   TITLE Pt. will be able to independently tie knot in fishing line.    Baseline 07/06/2020: Pt. is unable to tie a knot in fishing line.     Time 12    Period Weeks    Status New    Target Date 09/28/20                   Plan - 07/28/20 0757     Clinical Impression Statement Patient has continued to make excellent progress with right UE use.  Has a new baby now and has been holding the baby but mostly with his left UE and using a football hold for safety.  He reports difficulty with select kitchen tools and therefore we focused on gripping patterns, use of a variety of tools and ways to adjust, vary the use of tools for tasks in the kitchen.  Patient demonstrated difficulty with coordination of gripping especially with use of butter knife, recommend built up handles when using kitchen tools for best result and improved grip and control.  Instructed on set of built up tools on [employer] which also includes a rocker knife.  Continue to work towards goals in plan of care to improve right hand functional use for daily tasks at home, work and in the community.    OT Occupational Profile and History Detailed Assessment- Review of Records and additional review of physical, cognitive, psychosocial history related to current functional performance    Occupational performance deficits (Please refer to evaluation for details): ADL's;IADL's;Work;Leisure;Social Participation    Body Structure / Function / Physical Skills ADL;Dexterity;Strength;Balance;Coordination;FMC;IADL;Endurance;Sensation;UE functional use;Proprioception    Psychosocial Skills Environmental  Adaptations;Habits;Routines and Behaviors  Rehab Potential Good    Clinical Decision Making Several treatment options, min-mod task modification necessary    Comorbidities Affecting Occupational Performance: May have comorbidities impacting occupational performance    Modification or Assistance to Complete Evaluation  No modification of tasks or assist necessary to complete eval    OT Frequency 2x / week    OT Duration 12 weeks    OT Treatment/Interventions Self-care/ADL  training;Cryotherapy;Therapeutic exercise;DME and/or AE instruction;Balance training;Neuromuscular education;Manual Therapy;Moist Heat;Contrast Bath;Therapeutic activities;Patient/family education    Consulted and Agree with Plan of Care Patient             Patient will benefit from skilled therapeutic intervention in order to improve the following deficits and impairments:   Body Structure / Function / Physical Skills: ADL, Dexterity, Strength, Balance, Coordination, FMC, IADL, Endurance, Sensation, UE functional use, Proprioception   Psychosocial Skills: Environmental  Adaptations, Habits, Routines and Behaviors   Visit Diagnosis: Muscle weakness (generalized)  Other lack of coordination  Hemiparesis affecting dominant side as late effect of stroke [facility])  Apraxia    Problem List Patient Active Problem List   Diagnosis Date Noted   PFO (patent foramen ovale) 06/22/2020   Transaminitis    Hemiparesis affecting dominant side as late effect of stroke (HCC)    Blood pressure increase diastolic    Left middle cerebral artery stroke ([place]) 03/23/2020   Urinary retention    Dyslipidemia    Benign essential HTN    Acute ischemic stroke (HCC)    Global aphasia    Depression    Hyperglycemia    Right hemiparesis (HCC)    Aphasia    Elevated blood pressure reading    Stroke (cerebrum) ([place]) 03/17/2020   Enthesopathy of hip region on both sides 01/26/2019   Chronic right shoulder pain 12/10/2014   Scapular dyskinesis 07/29/2014   Bursitis of right shoulder 03/01/2014   Savino Whisenant T Majid Mccravy, OTR/L, CLT  Mia Milan 07/28/2020, 7:58 AM  [address] [place], [place], [address] Phone: [phone]   Fax:  [phone]  Name: Cody Henson MRN: [id number] Date of Birth: 02-Aug-1975

## 2020-08-01 ENCOUNTER — Encounter: Payer: Self-pay | Admitting: Physician Assistant

## 2020-08-01 ENCOUNTER — Ambulatory Visit: Payer: BC Managed Care – PPO | Admitting: Physician Assistant

## 2020-08-01 ENCOUNTER — Other Ambulatory Visit: Payer: Self-pay

## 2020-08-01 ENCOUNTER — Ambulatory Visit (INDEPENDENT_AMBULATORY_CARE_PROVIDER_SITE_OTHER): Payer: BC Managed Care – PPO

## 2020-08-01 VITALS — BP 98/62 | HR 74 | Ht 69.0 in | Wt 155.4 lb

## 2020-08-01 DIAGNOSIS — I639 Cerebral infarction, unspecified: Secondary | ICD-10-CM | POA: Diagnosis not present

## 2020-08-01 DIAGNOSIS — R002 Palpitations: Secondary | ICD-10-CM

## 2020-08-01 DIAGNOSIS — I10 Essential (primary) hypertension: Secondary | ICD-10-CM | POA: Diagnosis not present

## 2020-08-01 DIAGNOSIS — Z8774 Personal history of (corrected) congenital malformations of heart and circulatory system: Secondary | ICD-10-CM

## 2020-08-01 MED ORDER — AMOXICILLIN 500 MG PO CAPS: ORAL_CAPSULE | ORAL | 3 refills | Status: DC

## 2020-08-01 NOTE — Patient Instructions (Signed)
Medication Instructions:  1) Amoxicillin 547m- take 4 capsules one hour prior to any dental work  *If you need a refill on your cardiac medications before your next appointment, please call your pharmacy*   Lab Work: None If you have labs (blood work) drawn today and your tests are completely normal, you will receive your results only by: [place](if you have MyChart) OR A paper copy in the mail If you have any lab test that is abnormal or we need to change your treatment, we will call you to review the results.   Testing/Procedures: Your physician recommends that you wear a monitor for 14 days.   Follow-Up: At [facility] you and your health needs are our priority.  As part of our continuing mission to provide you with exceptional heart care, we have created designated Provider Care Teams.  These Care Teams include your primary Cardiologist (physician) and Advanced Practice Providers (APPs -  Physician Assistants and Nurse Practitioners) who all work together to provide you with the care you need, when you need it.  We recommend signing up for the patient portal called "MyChart".  Sign up information is provided on this After Visit Summary.  MyChart is used to connect with patients for Virtual Visits (Telemedicine).  Patients are able to view lab/test results, encounter notes, upcoming appointments, etc.  Non-urgent messages can be sent to your provider as well.   To learn more about what you can do with MyChart, go to [email or web address]    Your next appointment:   1 year(s)  The format for your next appointment:   In Person  Provider:   KNell Range PA-C   Other Instructions  ZIO AT Long term monitor-Live Telemetry  Your physician has requested you wear a ZIO patch monitor for 14 days.  This is a single patch monitor. [employer] supplies one patch monitor per enrollment. Additional  stickers are not available.  Please do not apply patch if you will be  having a Nuclear Stress Test, Echocardiogram, Cardiac CT, MRI,  or Chest Xray during the period you would be wearing the monitor. The patch cannot be worn during  these tests. You cannot remove and re-apply the ZIO AT patch monitor.  Your ZIO patch monitor will be mailed 3 day USPS to your address on file. It may take 3-5 days to  receive your monitor after you have been enrolled.  Once you have received your monitor, please review the enclosed instructions. Your monitor has  already been registered assigning a specific monitor serial # to you.   Billing and Patient Assistance Program information  ITheodore Demarkhas been supplied with any insurance information on record for billing. [employer] offers a sliding scale Patient Assistance Program for patients without insurance, or whose  insurance does not completely cover the cost of the ZIO patch monitor. You must apply for the  Patient Assistance Program to qualify for the discounted rate. To apply, call [employer] at [phone]  select option 4, select option 2 , ask to apply for the Patient Assistance Program, (you can request an  interpreter if needed). [employer] will ask your household income and how many people are in your  household. [employer] will quote your out-of-pocket cost based on this information. They will also be able  to set up a 12 month interest free payment plan if needed.  Applying the monitor   Shave hair from upper left chest.  Hold the abrader disc by orange tab. Rub the abrader in 40  strokes over left upper chest as indicated in  your monitor instructions.  Clean area with 4 enclosed alcohol pads. Use all pads to ensure the area is cleaned thoroughly. Let  dry.  Apply patch as indicated in monitor instructions. Patch will be placed under collarbone on left side of  chest with arrow pointing upward.  Rub patch adhesive wings for 2 minutes. Remove the white label marked "1". Remove the white label  marked "2". Rub patch adhesive  wings for 2 additional minutes.  While looking in a mirror, press and release button in center of patch. A small green light will flash 3-4  times. This will be your only indicator that the monitor has been turned on.  Do not shower for the first 24 hours. You may shower after the first 24 hours.  Press the button if you feel a symptom. You will hear a small click. Record Date, Time and Symptom in  the Patient Log.   Starting the Gateway  In your kit there is a Hydrographic surveyor box the size of a cellphone. This is Airline pilot. It transmits all your  recorded data to [facility]. This box must always stay within 10 feet of you. Open the box and push the *  button. There will be a light that blinks orange and then green a few times. When the light stops  blinking, the Gateway is connected to the ZIO patch. Call [employer] at [phone] to confirm your monitor is transmitting.  Returning your monitor  Remove your patch and place it inside the [place]. In the lower half of the Gateway there is a white  bag with prepaid postage on it. Place Gateway in bag and seal. Mail package back to [place] as soon as  possible. Your physician should have your final report approximately 7 days after you have mailed back  your monitor. Call [place] at [phone] if you have questions regarding your ZIO AT  patch monitor. Call them immediately if you see an orange light blinking on your monitor.  If your monitor falls off in less than 4 days, contact our Monitor department at [phone]. If your  monitor becomes loose or falls off after 4 days call [employer] at [phone] for suggestions on  securing your monitor

## 2020-08-01 NOTE — Progress Notes (Signed)
[facility]                                     Cardiology Office Note:    Date:  08/01/2020   ID:  [name], DOB [birth year], MRN [id number]  PCP:  Camillia Herter, NP  [facility] Cardiologist:  None  [facility] Electrophysiologist:  None   Referring MD: Camillia Herter, NP   1 month follow up s/p PFO closure   History of Present Illness:    Cody Henson is a 45 y.o. male with a hx of cryptogenic CVA and PFO s/p PFO closure (06/22/20) who presents to clinic for follow up.     On March 16, 2020, he developed acute onset of right hemiparesis, facial droop, and aphasia.  He was found to have an acute left MCA stroke without hemorrhage. The patient was treated with TPA. Follow-up MRI study showed mild scattered petechial hemorrhage but no frank hemorrhagic transformation. CTA study showed no evidence of large vessel vascular disease.  Bilateral venous Doppler studies were negative. Hypercoagulability panel was negative.  Transesophageal echo study demonstrated PFO with right to left shunting confirmed by agitated saline study. TCD bubble study was positive. He was seen by Dr. Burt Knack in 04/2020 for evaluation of percutaneous PFO closure. He was felt to be a good candidate for this.     He underwent successful transcatheter PFO closure using a 14 mm Amplatzer atrial septal occluder on 06/22/20. Post op echo showed stable position with no residual PFO/ASD.     Today he presents to clinic for follow up. Had a couple skipped beats the day of and day after surgery. These were not prolonged. Then a couple weeks ago he has some fluttering in his chest. It lasted about 1 week and came and went. This was during a stressful period after his wife had his second child. After the procedure he had hives for three days. This has resolved. No CP or SOB. No LE edema, orthopnea or PND. No dizziness or syncope. No blood in stool or urine.  Past Medical  History:  Diagnosis Date   Allergy    Phreesia 04/10/2020   Depressed    Hyperlipidemia    Phreesia 04/10/2020   Hypertension    Phreesia 04/10/2020   Neuromuscular disorder ([place])    Phreesia 04/10/2020   Stroke [facility])    Phreesia 04/10/2020    Past Surgical History:  Procedure Laterality Date   BUBBLE STUDY  03/21/2020   Procedure: BUBBLE STUDY;  Surgeon: [name], MD;  Location: [facility] ENDOSCOPY;  Service: Cardiology;;   PATENT FORAMEN OVALE(PFO) CLOSURE N/A 06/22/2020   Procedure: PATENT FORAMEN OVALE (PFO) CLOSURE;  Surgeon: Sherren Mocha, MD;  Location: [place] CV LAB;  Service: Cardiovascular;  Laterality: N/A;   TEE WITHOUT CARDIOVERSION N/A 03/21/2020   Procedure: TRANSESOPHAGEAL ECHOCARDIOGRAM (TEE);  Surgeon: [name], MD;  Location: [place];  Service: Cardiology;  Laterality: N/A;    Current Medications: Current Meds  Medication Sig   amoxicillin (AMOXIL) 500 MG capsule Take 4 capsules one hour prior to dental work   aspirin EC 81 MG tablet Take 1 tablet (81 mg total) by mouth daily. Swallow whole.   atorvastatin (LIPITOR) 80 MG tablet Take 1 tablet (80 mg total) by mouth daily.   clopidogrel (PLAVIX) 75 MG tablet Take 1 tablet (75 mg  total) by mouth daily.   lisinopril (ZESTRIL) 10 MG tablet Take 1 tablet (10 mg total) by mouth daily.   Multiple Vitamin (MULTIVITAMIN) tablet Take 1 tablet by mouth daily.   sertraline (ZOLOFT) 50 MG tablet Take 1 tablet by mouth every morning.   [DISCONTINUED] sertraline (ZOLOFT) 25 MG tablet Take 25 mg by mouth daily.     Allergies:   Albumen, egg; Other; and Eggs or egg-derived products   Social History   Socioeconomic History   Marital status: Married    Spouse name: Not on file   Number of children: Not on file   Years of education: Not on file   Highest education level: Not on file  Occupational History   Not on file  Tobacco Use   Smoking status: Never   Smokeless tobacco: Never  Substance  and Sexual Activity   Alcohol use: Not on file   Drug use: Not on file   Sexual activity: Not on file  Other Topics Concern   Not on file  Social History Narrative   Not on file   Social Determinants of Health   Financial Resource Strain: Not on file  Food Insecurity: Not on file  Transportation Needs: Not on file  Physical Activity: Not on file  Stress: Not on file  Social Connections: Not on file     Family History: The patient's family history includes Stroke in his maternal aunt and paternal grandfather.  ROS:   Please see the history of present illness.    All other systems reviewed and are negative.  EKGs/Labs/Other Studies Reviewed:    The following studies were reviewed today:  06/22/20  PATENT FORAMEN OVALE (PFO) CLOSURE  Conclusion  Successful transcatheter PFO closure using a 14 mm Amplatzer atrial septal occluder under intracardiac echo and fluoroscopic guidance, guided with balloon-sizing     _________________________     06/22/20 Echo limited IMPRESSIONS   1. Limited study: Post PFO closure with 14 mm [place] Occluder. Stable  position with no residual PFO/ASD.      EKG:  EKG is NOT ordered today.  Recent Labs: 05/17/2020: ALT 41; TSH 1.620 06/16/2020: BUN 13; Creatinine, Ser 1.15; Hemoglobin 15.2; Platelets 271; Potassium 4.7; Sodium 142  Recent Lipid Panel    Component Value Date/Time   CHOL 170 05/17/2020 1025   TRIG 169 (H) 05/17/2020 1025   HDL 35 (L) 05/17/2020 1025   CHOLHDL 4.9 05/17/2020 1025   CHOLHDL 5.3 03/17/2020 0344   VLDL 23 03/17/2020 0344   LDLCALC 105 (H) 05/17/2020 1025     Risk Assessment/Calculations:       Physical Exam:    VS:  BP 98/62   Pulse 74   Ht 5' 9"  (1.753 m)   Wt 155 lb 6.4 oz (70.5 kg)   SpO2 97%   BMI 22.95 kg/m     Wt Readings from Last 3 Encounters:  08/01/20 155 lb 6.4 oz (70.5 kg)  06/22/20 155 lb (70.3 kg)  06/16/20 155 lb (70.3 kg)     GEN: Well nourished, well developed in no acute  distress HEENT: Normal NECK: No JVD; No carotid bruits LYMPHATICS: No lymphadenopathy CARDIAC: RRR, no murmurs, rubs, gallops RESPIRATORY:  Clear to auscultation without rales, wheezing or rhonchi  ABDOMEN: Soft, non-tender, non-distended MUSCULOSKELETAL:  No edema; No deformity  SKIN: Warm and dry NEUROLOGIC:  Alert and oriented x 3 PSYCHIATRIC:  Normal affect   ASSESSMENT:    1. S/P percutaneous patent foramen ovale closure   2.  Cryptogenic stroke ([place])   3. Hypertension, unspecified type   4. Palpitations    PLAN:    In order of problems listed above:  S/p PFO closure: doing well. SBE prophylaxis discussed; I have RX'd amoxicillin. He understands this will only be necessary x 6 months. Continue on aspirin and Plavix. Plavix can be discontinued after 6 months of treatment. I will see him back in 1 year for echo with bubble.   Cryptogenic CVA: continue aspirin and statin  HTN: continue Lisinopril. BP 98/62 today.  Palpitations: had a week of on and off fluttering. Likely related to anxiety given increased stress with recent second child being born. Given very small increased risk of afib after PFO closure, will plan to place a Zio XT to rule out afib.   Medication Adjustments/Labs and Tests Ordered: Current medicines are reviewed at length with the patient today.  Concerns regarding medicines are outlined above.  Orders Placed This Encounter  Procedures   LONG TERM MONITOR (3-14 DAYS)    Meds ordered this encounter  Medications   amoxicillin (AMOXIL) 500 MG capsule    Sig: Take 4 capsules one hour prior to dental work    Dispense:  4 capsule    Refill:  3     Patient Instructions  Medication Instructions:  1) Amoxicillin 563m- take 4 capsules one hour prior to any dental work  *If you need a refill on your cardiac medications before your next appointment, please call your pharmacy*   Lab Work: None If you have labs (blood work) drawn today and your tests are  completely normal, you will receive your results only by: [place](if you have MyChart) OR A paper copy in the mail If you have any lab test that is abnormal or we need to change your treatment, we will call you to review the results.   Testing/Procedures: Your physician recommends that you wear a monitor for 14 days.   Follow-Up: [facility] and your health needs are our priority.  As part of our continuing mission to provide you with exceptional heart care, we have created designated Provider Care Teams.  These Care Teams include your primary Cardiologist (physician) and Advanced Practice Providers (APPs -  Physician Assistants and Nurse Practitioners) who all work together to provide you with the care you need, when you need it.  We recommend signing up for the patient portal called "MyChart".  Sign up information is provided on this After Visit Summary.  MyChart is used to connect with patients for Virtual Visits (Telemedicine).  Patients are able to view lab/test results, encounter notes, upcoming appointments, etc.  Non-urgent messages can be sent to your provider as well.   To learn more about what you can do with MyChart, go to [email or web address]    Your next appointment:   1 year(s)  The format for your next appointment:   In Person  Provider:   KNell Range PA-C   Other Instructions  ZIO AT Long term monitor-Live Telemetry  Your physician has requested you wear a ZIO patch monitor for 14 days.  This is a single patch monitor. [employer] supplies one patch monitor per enrollment. Additional  stickers are not available.  Please do not apply patch if you will be having a Nuclear Stress Test, Echocardiogram, Cardiac CT, MRI,  or Chest Xray during the period you would be wearing the monitor. The patch cannot be worn during  these tests. You cannot remove and re-apply the ZIO AT  patch monitor.  Your ZIO patch monitor will be mailed 3 day USPS to your  address on file. It may take 3-5 days to  receive your monitor after you have been enrolled.  Once you have received your monitor, please review the enclosed instructions. Your monitor has  already been registered assigning a specific monitor serial # to you.   Billing and Patient Assistance Program information  Theodore Demark has been supplied with any insurance information on record for billing. [employer] offers a sliding scale Patient Assistance Program for patients without insurance, or whose  insurance does not completely cover the cost of the ZIO patch monitor. You must apply for the  Patient Assistance Program to qualify for the discounted rate. To apply, call [employer] at [phone],  select option 4, select option 2 , ask to apply for the Patient Assistance Program, (you can request an  interpreter if needed). [employer] will ask your household income and how many people are in your  household. [employer] will quote your out-of-pocket cost based on this information. They will also be able  to set up a 12 month interest free payment plan if needed.  Applying the monitor   Shave hair from upper left chest.  Hold the abrader disc by orange tab. Rub the abrader in 40 strokes over left upper chest as indicated in  your monitor instructions.  Clean area with 4 enclosed alcohol pads. Use all pads to ensure the area is cleaned thoroughly. Let  dry.  Apply patch as indicated in monitor instructions. Patch will be placed under collarbone on left side of  chest with arrow pointing upward.  Rub patch adhesive wings for 2 minutes. Remove the white label marked "1". Remove the white label  marked "2". Rub patch adhesive wings for 2 additional minutes.  While looking in a mirror, press and release button in center of patch. A small green light will flash 3-4  times. This will be your only indicator that the monitor has been turned on.  Do not shower for the first 24 hours. You may shower after the first 24  hours.  Press the button if you feel a symptom. You will hear a small click. Record Date, Time and Symptom in  the Patient Log.   Starting the Gateway  In your kit there is a Hydrographic surveyor box the size of a cellphone. This is Airline pilot. It transmits all your  recorded data to [facility]. This box must always stay within 10 feet of you. Open the box and push the *  button. There will be a light that blinks orange and then green a few times. When the light stops  blinking, the Gateway is connected to the ZIO patch. Call [employer] at [phone] to confirm your monitor is transmitting.  Returning your monitor  Remove your patch and place it inside the [place]. In the lower half of the Gateway there is a white  bag with prepaid postage on it. Place Gateway in bag and seal. Mail package back to [place] as soon as  possible. Your physician should have your final report approximately 7 days after you have mailed back  your monitor. Call [place] at [phone] if you have questions regarding your ZIO AT  patch monitor. Call them immediately if you see an orange light blinking on your monitor.  If your monitor falls off in less than 4 days, contact our Monitor department at [phone]. If your  monitor becomes loose or falls off  after 4 days call [employer] at [phone] for suggestions on  securing your monitor     Signed, Angelena Form, Hershal Coria  08/01/2020 3:03 PM    [place]

## 2020-08-01 NOTE — Progress Notes (Unsigned)
Patient enrolled for [employer] to mail a 14 day ZIO XT long term holter monitor to address on file.

## 2020-08-03 ENCOUNTER — Other Ambulatory Visit: Payer: Self-pay

## 2020-08-03 ENCOUNTER — Encounter: Payer: Self-pay | Admitting: Occupational Therapy

## 2020-08-03 ENCOUNTER — Ambulatory Visit: Payer: BC Managed Care – PPO | Admitting: Occupational Therapy

## 2020-08-03 ENCOUNTER — Ambulatory Visit: Payer: BC Managed Care – PPO | Admitting: Physician Assistant

## 2020-08-03 DIAGNOSIS — R482 Apraxia: Secondary | ICD-10-CM | POA: Diagnosis not present

## 2020-08-03 DIAGNOSIS — R278 Other lack of coordination: Secondary | ICD-10-CM

## 2020-08-03 DIAGNOSIS — I69359 Hemiplegia and hemiparesis following cerebral infarction affecting unspecified side: Secondary | ICD-10-CM | POA: Diagnosis not present

## 2020-08-03 DIAGNOSIS — M6281 Muscle weakness (generalized): Secondary | ICD-10-CM

## 2020-08-03 NOTE — Therapy (Signed)
[name] MAIN [facility] SERVICES [address] [place], [place], [address] Phone: [phone]   Fax:  [phone]  Occupational Therapy Treatment  Patient Details  Name: Cody Henson MRN: [id number] Date of Birth: [birth year] No data recorded  Encounter Date: 08/03/2020   OT End of Session - 08/03/20 1321     Visit Number 18    Number of Visits 24    Date for OT Re-Evaluation 09/28/20    OT Start Time 0109    OT Stop Time 1345    OT Time Calculation (min) 47 min    Activity Tolerance Patient tolerated treatment well    Behavior During Therapy [facility] for tasks assessed/performed             Past Medical History:  Diagnosis Date   Allergy    Phreesia 04/10/2020   Depressed    Hyperlipidemia    Phreesia 04/10/2020   Hypertension    Phreesia 04/10/2020   Neuromuscular disorder ([place])    Phreesia 04/10/2020   Stroke [facility])    Phreesia 04/10/2020    Past Surgical History:  Procedure Laterality Date   BUBBLE STUDY  03/21/2020   Procedure: BUBBLE STUDY;  Surgeon: [name], MD;  Location: [facility] ENDOSCOPY;  Service: Cardiology;;   PATENT FORAMEN OVALE(PFO) CLOSURE N/A 06/22/2020   Procedure: PATENT FORAMEN OVALE (PFO) CLOSURE;  Surgeon: Sherren Mocha, MD;  Location: [place] CV LAB;  Service: Cardiovascular;  Laterality: N/A;   TEE WITHOUT CARDIOVERSION N/A 03/21/2020   Procedure: TRANSESOPHAGEAL ECHOCARDIOGRAM (TEE);  Surgeon: [name], MD;  Location: [place];  Service: Cardiology;  Laterality: N/A;    There were no vitals filed for this visit.   Subjective Assessment - 08/03/20 1320     Subjective  Pt reports the newborn baby was able to come home last friday after being treated for jaundice.    Pertinent History Per medial records, Cody Henson is a 45 y.o. male with history of depression who was admitted on 03/16/20 with right sided weakness, facial weakness and aphasia. CT head showed evolving hypodensity in  mid/posterior left frontal lobe and CTA was negative for LVO. UDS showed THC. He received tPA and follow up MRI showed patchy multifocal acute ischemic infarcts in left cerebral hemisphere without hemorrhagic transformation.    Patient Stated Goals Pt would like to be as independent as possible, wants to drive and return to work.    Currently in Pain? No/denies    Pain Score 0-No pain            Therapeutic Exercises:  UBE in standing, resistance of 3.0 to 3.5 , forwards/backwards and alternating levels of resistance, 8 mins. Therapist in constant attendance to ensure grip and to adjust settings.  Bulletin board with push pins with use of moderate resistive board, increased difficulty to push pins in straight and flush with the board.   Neuromuscular Reeducation: large tweezers with right hand to pick up small 2 inch sticks and place into container, cues for prehension patterns.  Small 1/2 inch pegs to place into board and then remove with tweezers   Then also worked to remove small pegs from board with hand, cues for translatory movements of the hand and using hand for storage to hold items in palm and focusing on not dropping any out of the ulnar side of the hand.   Chinese balls with right hand, able to perform clockwise with moderate difficulty, unable to perform counterclockwise.  Focus on thumb  for moving one ball across the MPs, then moving one ball with thumb up to the tip of the index and middle finger, then on the ulnar side with ring and small with max difficulty especially towards small finger Attempts to make a perch with fingers max difficulty, after several attempts, pt able to move more towards this position. Attempted use of forceps with difficulty to pick up small beads from board.   Response to tx: Pt has continued to make great progress in his right hand with coordination skills, grasp/release and tool use.  He continues to demonstrate difficulty at times with modulation  of grip and pinch on the right.  He demonstrates difficulty with managing clipping his nails and needs more attention and detail work on this task.  He is using his hand more in daily activities at home.  Continue to work towards goals in plan of care to maximize safety and independence in necessary daily tasks.                       OT Education - 08/03/20 1321     Education Details fine motor coordination skills    Person(s) Educated Patient    Methods Explanation;Demonstration    Comprehension Verbalized understanding;Returned demonstration                 OT Long Term Goals - 07/06/20 1001       OT LONG TERM GOAL #1   Title Pt will be independent with home exercise program    Baseline no program at eval, 05/11/20: ongoing changes to HEP 07/06/2020: Independent with Home exercises. Exercises to be added in an ongoing basis.    Time 12    Period Weeks    Status On-going    Target Date 09/28/20      OT LONG TERM GOAL #2   Title Pt will complete dressing skills, UE and LE with modified independence    Baseline difficulty with socks and shoes at times, unable to tie laces.  05/11/2020: Progressing occasional difficulty with tying laces. 07/06/2020: Pt. is able to dress himself, and tie his shoes, however the process slow, and requires increased time.    Time 12    Target Date 09/28/20      OT LONG TERM GOAL #3   Title Pt will improve right grip strength by 15# to assist with cutting meat with modified independence.    Baseline Eval unable, 05/11/2020: Difficulty with cutting meat secondary to decreased sensation however grip strength has improved dramatically. 07/06/2020: Pt. is steadily progressing with grip strength.  Pt. is able to cutt meat now, however has difficulty cutting boxes.    Time 12    Period Weeks    Status Partially Met    Target Date 09/28/20      OT LONG TERM GOAL #4   Title Pt will improve right hand coordination to brush teeth without  difficulty.    Baseline Eval:has to use left hand currently, 05/11/2020: Tasks still uncoordinated 07/06/2020: Pt. is independent with brushing his teeth.    Time 6    Period Weeks    Status Achieved      OT LONG TERM GOAL #5   Title Patient will improve typing speed greater than 12 WPM to answer emails and perform work tasks.    Baseline Eval:  adjust WPM 4.05/11/2020: Partially met is improved to 8 words a minute 07/06/2020: Pt. continues to present with limited typing speed.  Time 12    Period Weeks    Status Partially Met    Target Date 09/28/20      OT LONG TERM GOAL #6   Title Patient will improve strength in right hand to pick up 52 yo daughter from high chair.    Baseline unable at eval 07/06/2020: pt. is able to pick up his daughter from a high chair.    Time 12    Period Weeks    Status Achieved      OT LONG TERM GOAL #7   Title Patient will improve hand function to write and sign his name on important papers with legibilty greater than 50%    Baseline Eval: 25%, poor legibility, 05/11/2020 still working on improving legibility 07/06/2020: pt. continues to work on writing legibility    Time 12    Period Weeks    Status On-going    Target Date 09/28/20      OT LONG TERM GOAL #8   Title Pt will improve FOTO score to 65 or greater to show clinically relevant change to engage in ADL and IADL tasks with greater independence.    Baseline Eval 51; 07/05/2020: 59    Time 12    Period Weeks    Status On-going    Target Date 09/28/20      OT LONG TERM GOAL  #9   TITLE Pt. will be able to independently tie knot in fishing line.    Baseline 07/06/2020: Pt. is unable to tie a knot in fishing line.    Time 12    Period Weeks    Status New    Target Date 09/28/20                   Plan - 08/03/20 1321     Clinical Impression Statement Pt has continued to make great progress in his right hand with coordination skills, grasp/release and tool use.  He continues to  demonstrate difficulty at times with modulation of grip and pinch on the right.  He demonstrates difficulty with managing clipping his nails and needs more attention and detail work on this task.  He is using his hand more in daily activities at home.  Continue to work towards goals in plan of care to maximize safety and independence in necessary daily tasks.    OT Occupational Profile and History Detailed Assessment- Review of Records and additional review of physical, cognitive, psychosocial history related to current functional performance    Occupational performance deficits (Please refer to evaluation for details): ADL's;IADL's;Work;Leisure;Social Participation    Body Structure / Function / Physical Skills ADL;Dexterity;Strength;Balance;Coordination;FMC;IADL;Endurance;Sensation;UE functional use;Proprioception    Psychosocial Skills Environmental  Adaptations;Habits;Routines and Behaviors    Rehab Potential Good    Clinical Decision Making Several treatment options, min-mod task modification necessary    Comorbidities Affecting Occupational Performance: May have comorbidities impacting occupational performance    Modification or Assistance to Complete Evaluation  No modification of tasks or assist necessary to complete eval    OT Frequency 2x / week    OT Duration 12 weeks    OT Treatment/Interventions Self-care/ADL training;Cryotherapy;Therapeutic exercise;DME and/or AE instruction;Balance training;Neuromuscular education;Manual Therapy;Moist Heat;Contrast Bath;Therapeutic activities;Patient/family education    Consulted and Agree with Plan of Care Patient             Patient will benefit from skilled therapeutic intervention in order to improve the following deficits and impairments:   Body Structure / Function / Physical Skills: ADL, Dexterity, Strength, Balance,  Coordination, [place], IADL, Endurance, Sensation, UE functional use, Proprioception   Psychosocial Skills: Environmental   Adaptations, Habits, Routines and Behaviors   Visit Diagnosis: Muscle weakness (generalized)  Other lack of coordination  Hemiparesis affecting dominant side as late effect of stroke [facility])    Problem List Patient Active Problem List   Diagnosis Date Noted   PFO (patent foramen ovale) 06/22/2020   Transaminitis    Hemiparesis affecting dominant side as late effect of stroke (HCC)    Blood pressure increase diastolic    Left middle cerebral artery stroke ([place]) 03/23/2020   Urinary retention    Dyslipidemia    Benign essential HTN    Acute ischemic stroke ([place])    Global aphasia    Depression    Hyperglycemia    Right hemiparesis (HCC)    Aphasia    Elevated blood pressure reading    Stroke (cerebrum) ([place]) 03/17/2020   Enthesopathy of hip region on both sides 01/26/2019   Chronic right shoulder pain 12/10/2014   Scapular dyskinesis 07/29/2014   Bursitis of right shoulder 03/01/2014   Raegan Sipp T Lakedra [place], OTR/L, CLT  Newel Oien 08/04/2020, 1:56 PM  [place], [place], [address] Phone: [phone]   Fax:  [phone]  Name: Shahir Karen MRN: [id number] Date of Birth: 1976-02-05

## 2020-08-10 ENCOUNTER — Ambulatory Visit: Payer: BC Managed Care – PPO

## 2020-08-10 ENCOUNTER — Other Ambulatory Visit: Payer: Self-pay

## 2020-08-10 DIAGNOSIS — M6281 Muscle weakness (generalized): Secondary | ICD-10-CM

## 2020-08-10 DIAGNOSIS — I69359 Hemiplegia and hemiparesis following cerebral infarction affecting unspecified side: Secondary | ICD-10-CM | POA: Diagnosis not present

## 2020-08-10 DIAGNOSIS — R278 Other lack of coordination: Secondary | ICD-10-CM

## 2020-08-10 DIAGNOSIS — R482 Apraxia: Secondary | ICD-10-CM

## 2020-08-10 NOTE — Therapy (Signed)
[place] MAIN [facility] SERVICES [address] [place], [place], [address] Phone: [phone]   Fax:  [phone]  Occupational Therapy Treatment  Patient Details  Name: Cody Henson MRN: [id number] Date of Birth: [birth year] No data recorded  Encounter Date: 08/10/2020   OT End of Session - 08/10/20 1118     Visit Number 19    Number of Visits 24    Date for OT Re-Evaluation 09/28/20    OT Start Time 0915    OT Stop Time 0958    OT Time Calculation (min) 43 min    Activity Tolerance Patient tolerated treatment well    Behavior During Therapy [facility] for tasks assessed/performed             Past Medical History:  Diagnosis Date   Allergy    Phreesia 04/10/2020   Depressed    Hyperlipidemia    Phreesia 04/10/2020   Hypertension    Phreesia 04/10/2020   Neuromuscular disorder ([place])    Phreesia 04/10/2020   Stroke [facility])    Phreesia 04/10/2020    Past Surgical History:  Procedure Laterality Date   BUBBLE STUDY  03/21/2020   Procedure: BUBBLE STUDY;  Surgeon: [name], MD;  Location: [facility] ENDOSCOPY;  Service: Cardiology;;   PATENT FORAMEN OVALE(PFO) CLOSURE N/A 06/22/2020   Procedure: PATENT FORAMEN OVALE (PFO) CLOSURE;  Surgeon: Sherren Mocha, MD;  Location: [place] CV LAB;  Service: Cardiovascular;  Laterality: N/A;   TEE WITHOUT CARDIOVERSION N/A 03/21/2020   Procedure: TRANSESOPHAGEAL ECHOCARDIOGRAM (TEE);  Surgeon: [name], MD;  Location: [place];  Service: Cardiology;  Laterality: N/A;    There were no vitals filed for this visit.   Subjective Assessment - 08/10/20 1116     Subjective  Pt reports he's doing well today.    Pertinent History Per medial records, Cody Henson is a 45 y.o. male with history of depression who was admitted on 03/16/20 with right sided weakness, facial weakness and aphasia. CT head showed evolving hypodensity in mid/posterior left frontal lobe and CTA was negative for LVO. UDS  showed THC. He received tPA and follow up MRI showed patchy multifocal acute ischemic infarcts in left cerebral hemisphere without hemorrhagic transformation.    Patient Stated Goals Pt would like to be as independent as possible, wants to drive and return to work.    Currently in Pain? No/denies    Pain Score 0-No pain    Multiple Pain Sites No            Occupational Therapy treatment: Self care training: Used toe nail clippers to clip [place]illa folder to mimic task of clipping nails.  Simulated clipping nails with drawing/cut out of fingers.  Pt had difficulty keeping thumb from sliding while pinching which caused clippers to slip out of hand, requiring constant repositioning.  OT added coban to widen grip and provide non-skid grip while pinching which pt felt was effective.  Pt required intermittent rest and digit stretching breaks d/t muscle fatigue in hand. Gave pt coban wrap to take home and use as needed to build up his clippers and advised that coban can also be obtained at a local drug store.    Neuro re-education: Instructed pt in digit isolation exercises for R hand, including finger taps on table.  Pt had difficulty isolating 3rd and 4th digits, requiring OT to hold down other digits and often initiate tapping with a tactile cue.  Completed digit abd/adduction with hand flat on table  x3 sets 10 reps each.  Practiced holding R hand to form #1-#5 hand signals.  Pt had difficulty forming #2 and #3 hand signals, and transitioned to practicing thumb opposition to 4th and 5th digits to better form these signals.  Addressed hand writing with pt tracing pen within high lighter wavy and jagged lines with fair accuracy.  Vc to try moving pen with digit and wrist movements vs larger forearm movements.  Pt able to correct with vc, but still had occasional slipping of pen grasp.  Practiced picking up small buttons with R hand using 3 jaw chuck prehension pattern as R middle finger slips down when  manipulating a pen.  Multiple attempts to incorporate MF with this prehension pattern.   Response to treatment: Pt making steady gains with R hand strength and coordination.  R hand remains mild-moderately apraxic which limits typing, writing, and self care activities such as clipping nails, cutting food, or scooping ice cream.  Pt will continue to benefit from OT for neuro re-ed to RUE in order to improve accuracy and efficiency with tasks noted above.      OT Education - 08/10/20 1117     Education Details digit isolation exercises for R hand    Person(s) Educated Patient    Methods Explanation;Demonstration    Comprehension Verbalized understanding;Returned demonstration              OT Long Term Goals - 07/06/20 1001       OT LONG TERM GOAL #1   Title Pt will be independent with home exercise program    Baseline no program at eval, 05/11/20: ongoing changes to HEP 07/06/2020: Independent with Home exercises. Exercises to be added in an ongoing basis.    Time 12    Period Weeks    Status On-going    Target Date 09/28/20      OT LONG TERM GOAL #2   Title Pt will complete dressing skills, UE and LE with modified independence    Baseline difficulty with socks and shoes at times, unable to tie laces.  05/11/2020: Progressing occasional difficulty with tying laces. 07/06/2020: Pt. is able to dress himself, and tie his shoes, however the process slow, and requires increased time.    Time 12    Target Date 09/28/20      OT LONG TERM GOAL #3   Title Pt will improve right grip strength by 15# to assist with cutting meat with modified independence.    Baseline Eval unable, 05/11/2020: Difficulty with cutting meat secondary to decreased sensation however grip strength has improved dramatically. 07/06/2020: Pt. is steadily progressing with grip strength.  Pt. is able to cutt meat now, however has difficulty cutting boxes.    Time 12    Period Weeks    Status Partially Met    Target Date  09/28/20      OT LONG TERM GOAL #4   Title Pt will improve right hand coordination to brush teeth without difficulty.    Baseline Eval:has to use left hand currently, 05/11/2020: Tasks still uncoordinated 07/06/2020: Pt. is independent with brushing his teeth.    Time 6    Period Weeks    Status Achieved      OT LONG TERM GOAL #5   Title Patient will improve typing speed greater than 12 WPM to answer emails and perform work tasks.    Baseline Eval:  adjust WPM 4.05/11/2020: Partially met is improved to 8 words a minute 07/06/2020: Pt. continues to  present with limited typing speed.    Time 12    Period Weeks    Status Partially Met    Target Date 09/28/20      OT LONG TERM GOAL #6   Title Patient will improve strength in right hand to pick up 85 yo daughter from high chair.    Baseline unable at eval 07/06/2020: pt. is able to pick up his daughter from a high chair.    Time 12    Period Weeks    Status Achieved      OT LONG TERM GOAL #7   Title Patient will improve hand function to write and sign his name on important papers with legibilty greater than 50%    Baseline Eval: 25%, poor legibility, 05/11/2020 still working on improving legibility 07/06/2020: pt. continues to work on writing legibility    Time 12    Period Weeks    Status On-going    Target Date 09/28/20      OT LONG TERM GOAL #8   Title Pt will improve FOTO score to 65 or greater to show clinically relevant change to engage in ADL and IADL tasks with greater independence.    Baseline Eval 51; 07/05/2020: 59    Time 12    Period Weeks    Status On-going    Target Date 09/28/20      OT LONG TERM GOAL  #9   TITLE Pt. will be able to independently tie knot in fishing line.    Baseline 07/06/2020: Pt. is unable to tie a knot in fishing line.    Time 12    Period Weeks    Status New    Target Date 09/28/20             Plan -08/10/2020 1152a         Clinical Impression Statement Pt making steady gains with R hand  strength and coordination.  R hand remains mild-moderately apraxic which limits typing, writing, and self care activities such as clipping nails, cutting food, or scooping ice cream.  Pt will continue to benefit from OT for neuro re-ed to RUE in order to improve accuracy and efficiency with tasks noted above.     OT Occupational Profile and History Detailed Assessment- Review of Records and additional review of physical, cognitive, psychosocial history related to current functional performance     Occupational performance deficits (Please refer to evaluation for details): ADL's;IADL's;Work;Leisure;Social Participation     Body Structure / Function / Physical Skills ADL;Dexterity;Strength;Balance;Coordination;FMC;IADL;Endurance;Sensation;UE functional use;Proprioception     Psychosocial Skills Environmental  Adaptations;Habits;Routines and Behaviors     Rehab Potential Good     Clinical Decision Making Several treatment options, min-mod task modification necessary     Comorbidities Affecting Occupational Performance: May have comorbidities impacting occupational performance     Modification or Assistance to Complete Evaluation No modification of tasks or assist necessary to complete eval     OT Frequency 2x / week     OT Duration 12 weeks     OT Treatment/Interventions Self-care/ADL training;Cryotherapy;Therapeutic exercise;DME and/or AE instruction;Balance training;Neuromuscular education;Manual Therapy;Moist Heat;Contrast Bath;Therapeutic activities;Patient/family education     Consulted and Agree with Plan of Care Patient         Patient will benefit from skilled therapeutic intervention in order to improve the following deficits and impairments:   Body Structure / Function / Physical Skills: ADL, Dexterity, Strength, Balance, Coordination, FMC, IADL, Endurance, Sensation, UE functional use, Proprioception   Psychosocial Skills: Environmental  Adaptations, Habits, Routines and Behaviors Visit  Diagnosis: Muscle weakness (generalized)  Other lack of coordination  Hemiparesis affecting dominant side as late effect of stroke [facility])  Apraxia    Problem List Patient Active Problem List   Diagnosis Date Noted   PFO (patent foramen ovale) 06/22/2020   Transaminitis    Hemiparesis affecting dominant side as late effect of stroke ([place])    Blood pressure increase diastolic    Left middle cerebral artery stroke ([place]) 03/23/2020   Urinary retention    Dyslipidemia    Benign essential HTN    Acute ischemic stroke ([place])    Global aphasia    Depression    Hyperglycemia    Right hemiparesis ([facility])    Aphasia    Elevated blood pressure reading    Stroke (cerebrum) ([place]) 03/17/2020   Enthesopathy of hip region on both sides 01/26/2019   Chronic right shoulder pain 12/10/2014   Scapular dyskinesis 07/29/2014   Bursitis of right shoulder 03/01/2014   Leta Speller, MS, OTR/L  Darleene Cleaver 08/10/2020, 11:53 AM  [facility] [address] [place], [place], [address] Phone: [phone]   Fax:  [phone]  Name: Cody Henson MRN: [id number] Date of Birth: 02/08/1976

## 2020-08-13 DIAGNOSIS — R002 Palpitations: Secondary | ICD-10-CM

## 2020-08-16 NOTE — Progress Notes (Signed)
Patient ID: Cody Henson, male    DOB: 09/12/1975  MRN: [id number]  CC: Hypertension Follow-Up   Subjective: Ghazi Rumpf is a 45 y.o. male who presents for hypertension follow-up.   His concerns today include:   HYPERTENSION FOLLOW-UP: 04/11/2020: - Stable. - Continue Lisinopril as prescribed.   Visit 08/01/2020 at [facility] per PA note: HTN: continue Lisinopril. BP 98/62 today.  08/17/2020:  Doing well on current regimen. No side effects. No issues/concerns.   2. DYSLIPIDEMIA FOLLOW-UP: Requesting refills on Atorvastatin. Doing well on current regimen. No side effects. No issues/concerns.   3. TINEA OF FACE: Long-term. Using Terbinafine in the past and helped. Would like referral to Dermatology.   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   Urinary retention    Dyslipidemia    Benign essential HTN    Acute ischemic stroke ([place])    Global aphasia    Depression    Hyperglycemia    Right hemiparesis ([facility])    Aphasia    Elevated blood pressure reading    Stroke (cerebrum) ([place]) 03/17/2020   Enthesopathy of hip region on both sides 01/26/2019   Chronic right shoulder pain 12/10/2014   Scapular dyskinesis 07/29/2014   Bursitis of right shoulder 03/01/2014     Current Outpatient Medications on File Prior to Visit  Medication Sig Dispense Refill   amoxicillin (AMOXIL) 500 MG capsule Take 4 capsules one hour prior to dental work 4 capsule 3   aspirin EC 81 MG tablet Take 1 tablet (81 mg total) by mouth daily. Swallow whole. 150 tablet 2   clopidogrel (PLAVIX) 75 MG tablet Take 1 tablet (75 mg total) by mouth daily. 90 tablet 1   Multiple Vitamin (MULTIVITAMIN) tablet Take 1 tablet by mouth daily.     sertraline (ZOLOFT) 50  MG tablet Take 1 tablet by mouth every morning.     No current facility-administered medications on file prior to visit.    Allergies  Allergen Reactions   Albumen, Egg Itching   Other Hives    Chicken meat, [place]   Eggs Or Egg-Derived Products Itching    Social History   Socioeconomic History   Marital status: Married    Spouse name: Not on file   Number of children: Not on file   Years of education: Not on file   Highest education level: Not on file  Occupational History   Not on file  Tobacco Use   Smoking status: Never   Smokeless tobacco: Never  Substance and Sexual Activity   Alcohol use: Not on file   Drug use: Not on file   Sexual activity: Not on file  Other Topics Concern   Not on file  Social History Narrative   Not on file   Social Determinants of Health   Financial Resource Strain: Not on file  Food Insecurity: Not on file  Transportation Needs: Not on file  Physical Activity: Not on file  Stress: Not on file  Social Connections: Not on file  Intimate Partner Violence: Not on file    Family History  Problem Relation Age of Onset   Stroke Paternal Grandfather    Stroke Maternal Aunt     Past Surgical History:  Procedure Laterality  Date   BUBBLE STUDY  03/21/2020   Procedure: BUBBLE STUDY;  Surgeon: [name], MD;  Location: [facility] ENDOSCOPY;  Service: Cardiology;;   PATENT FORAMEN OVALE(PFO) CLOSURE N/A 06/22/2020   Procedure: PATENT FORAMEN OVALE (PFO) CLOSURE;  Surgeon: Sherren Mocha, MD;  Location: [place] CV LAB;  Service: Cardiovascular;  Laterality: N/A;   TEE WITHOUT CARDIOVERSION N/A 03/21/2020   Procedure: TRANSESOPHAGEAL ECHOCARDIOGRAM (TEE);  Surgeon: [name], MD;  Location: [place];  Service: Cardiology;  Laterality: N/A;    ROS: Review of Systems Negative except as stated above  PHYSICAL EXAM: BP 119/79 (BP Location: Left Arm, Patient Position: Sitting, Cuff Size: Normal)   Pulse 82   Temp 98.1 F  (36.7 C)   Resp 15   Ht 5' 9.02" (1.753 m)   Wt 151 lb 3.2 oz (68.6 kg)   SpO2 96%   BMI 22.32 kg/m   Physical Exam General appearance - alert, well appearing, and in no distress and oriented to person, place, and time Chest - clear to auscultation, no wheezes, rales or rhonchi, symmetric air entry, no tachypnea, retractions or cyanosis, Zio XT monitor in place on left upper chest Heart - no murmurs, rubs, clicks or gallops Neurological - alert, oriented, normal speech, no focal findings or movement disorder noted   ASSESSMENT AND PLAN: 1. Essential hypertension: - Blood pressure at goal today in office.  - Continue Lisinopril as prescribed.  - Counseled on blood pressure goal of less than 130/80, low-sodium, DASH diet, medication compliance, 150 minutes of moderate intensity exercise per week as tolerated. Discussed medication compliance, adverse effects. - Follow-up with primary provider in 3 months or sooner if needed.  - lisinopril (ZESTRIL) 10 MG tablet; Take 1 tablet (10 mg total) by mouth daily.  Dispense: 90 tablet; Refill: 0  2. Dyslipidemia: -Practice low-fat heart healthy diet and at least 150 minutes of moderate intensity exercise weekly as tolerated.  - Continue Atorvastatin as prescribed.  - Follow-up with primary provider as scheduled.  - atorvastatin (LIPITOR) 80 MG tablet; Take 1 tablet (80 mg total) by mouth daily.  Dispense: 120 tablet; Refill: 0  3. Tinea faciale: - Long-term. Using Terbinafine in the past and helped.  - Per patient request referral to Dermatology for further evaluation and management.  - Ambulatory referral to Dermatology    Patient was given the opportunity to ask questions.  Patient verbalized understanding of the plan and was able to repeat key elements of the plan. Patient was given clear instructions to go to Emergency Department or return to medical center if symptoms don't improve, worsen, or new problems develop.The patient verbalized  understanding.   Orders Placed This Encounter  Procedures   Ambulatory referral to Dermatology     Requested Prescriptions   Signed Prescriptions Disp Refills   lisinopril (ZESTRIL) 10 MG tablet 90 tablet 0    Sig: Take 1 tablet (10 mg total) by mouth daily.   atorvastatin (LIPITOR) 80 MG tablet 120 tablet 0    Sig: Take 1 tablet (80 mg total) by mouth daily.    Return in about 3 months (around 11/17/2020) for Follow-Up hypertension .  Camillia Herter, NP

## 2020-08-17 ENCOUNTER — Ambulatory Visit (INDEPENDENT_AMBULATORY_CARE_PROVIDER_SITE_OTHER): Payer: BC Managed Care – PPO | Admitting: Adult Health

## 2020-08-17 ENCOUNTER — Ambulatory Visit (INDEPENDENT_AMBULATORY_CARE_PROVIDER_SITE_OTHER): Payer: BC Managed Care – PPO | Admitting: Family

## 2020-08-17 ENCOUNTER — Encounter: Payer: Self-pay | Admitting: Adult Health

## 2020-08-17 ENCOUNTER — Encounter: Payer: Self-pay | Admitting: Family

## 2020-08-17 ENCOUNTER — Other Ambulatory Visit: Payer: Self-pay

## 2020-08-17 VITALS — BP 121/78 | HR 76 | Ht 69.0 in | Wt 154.0 lb

## 2020-08-17 VITALS — BP 119/79 | HR 82 | Temp 98.1°F | Resp 15 | Ht 69.02 in | Wt 151.2 lb

## 2020-08-17 DIAGNOSIS — I63512 Cerebral infarction due to unspecified occlusion or stenosis of left middle cerebral artery: Secondary | ICD-10-CM | POA: Diagnosis not present

## 2020-08-17 DIAGNOSIS — I10 Essential (primary) hypertension: Secondary | ICD-10-CM

## 2020-08-17 DIAGNOSIS — Q211 Atrial septal defect: Secondary | ICD-10-CM

## 2020-08-17 DIAGNOSIS — Q2112 Patent foramen ovale: Secondary | ICD-10-CM

## 2020-08-17 DIAGNOSIS — B358 Other dermatophytoses: Secondary | ICD-10-CM

## 2020-08-17 DIAGNOSIS — E785 Hyperlipidemia, unspecified: Secondary | ICD-10-CM | POA: Diagnosis not present

## 2020-08-17 MED ORDER — LISINOPRIL 10 MG PO TABS: 10.0000 mg | ORAL_TABLET | Freq: Every day | ORAL | 0 refills | Status: DC

## 2020-08-17 MED ORDER — ATORVASTATIN CALCIUM 80 MG PO TABS: 80.0000 mg | ORAL_TABLET | Freq: Every day | ORAL | 0 refills | Status: DC

## 2020-08-17 NOTE — Patient Instructions (Signed)
Continue working with occupational therapy for likely ongoing recovery  Continue aspirin 81 mg daily and clopidogrel 75 mg daily  and atorvastatin for secondary stroke prevention  Continue to follow up with PCP regarding cholesterol and blood pressure management  Maintain strict control of hypertension with blood pressure goal below 130/90 and cholesterol with LDL cholesterol (bad cholesterol) goal below 70 mg/dL.       Followup in the future with me in 6 months or call earlier if needed       Thank you for coming to see [place] at [facility]. I hope we have been able to provide you high quality care today.  You may receive a patient satisfaction survey over the next few weeks. We would appreciate your feedback and comments so that we may continue to improve ourselves and the health of our patients.

## 2020-08-17 NOTE — Progress Notes (Signed)
Pt presents for hypertension f/u, pt states he needs medication refills on Lisinopril and Atorvastatin

## 2020-08-17 NOTE — Patient Instructions (Signed)

## 2020-08-17 NOTE — Progress Notes (Signed)
[facility] [address] [place]. [place] [address] [phone]       STROKE FOLLOW UP NOTE  [name] Date of Birth:  October 09, 1975 Medical Record Number:  [id number]   Reason for Referral:  stroke follow up    SUBJECTIVE:   CHIEF COMPLAINT:  Chief Complaint  Patient presents with   Follow-up    RM 14 alone Pt is well and stable, still having some R sided numbness/lack of sensation      HPI:   Today, 08/17/2020, Cody Henson returns for 80-month stroke follow-up unaccompanied.  Reports residual right hand dexterity issues and altered sensation.  He continues to work with OT with some progress since prior visit.  He has not yet been able to return back to work.  He does have occasional difficulty with speech but overall great improvement.  Denies residual difficulties.  Denies new stroke/TIA symptoms. Underwent PFO closure on 06/22/2020 by Dr. Burt Knack without complication.  Has remained on aspirin and Plavix without associated side effects with plans on continuing for 6 months postprocedure.  Remains on atorvastatin without associated side effects.  Blood pressure today 121/78.  No further concerns at this time.   History provided for reference purposes only Initial visit 05/05/2020 JM: 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 Burt Knack for possible PFO closure   No further concerns at this time Stroke admission 03/16/2020 Cody Henson is a 45 y.o. male with unknown past medical history who presented to [facility] ED on 03/16/2020 with acute onset of dense expressive and receptive aphasia with right-sided weakness.   Personally reviewed hospitalization pertinent progress notes, lab work and imaging with summary provided.  Initially evaluated by Dr. Erlinda Hong with stroke work-up revealing acute ischemic infarct in the left MCA distribution s/p tPA likely embolic secondary to unclear source.  TEE and TCD positive PFO.  Recommended consideration of PFO closure if no other source of stroke found.  Hypercoagulable panel normal.  LDL 149 -initiate atorvastatin 80 mg daily.  Other stroke risk factors include family history of stroke and THC use but no prior stroke history.  Residual right-sided weakness, aphasia and apraxia.  Evaluated by therapies and recommended discharge to CIR for ongoing therapy needs.  Stroke: Acute ischemic infarct in left MCA distribution s/p tPA likely embolic in origin, unclear source at this point. Code Stroke CT head: Subtle evolving hypodensity involving the supra ganglionic mid and posterior left frontal lobe, concerning for acute left MCA distribution infarct. ASPECTS 8. CTA head & neck:  Negative CTA for large vessel occlusion. Mild irregularity of distal left MCA branches, which could be related to recent recanalization. MRI: Patchy multifocal areas of restricted diffusion seen involving the cortical  and subcortical aspect of the left frontal and parietal lobes as well as the left temporal occipital region, consistent with acute ischemic infarcts, left MCA distribution Doppler US Lower  extremities: No evidence of deep vein thrombosis 2D Echo:  EF 60 to 65%. TEE - Positive PFO with bubbles crossing the atrial septum within 1-2 heart beats. Right to left shunt. TCD bubble study: Positive with Grade 2 during rest and Grade 3 on valsalva LDL 149 HgbA1c 5.1 UDS + THC Hypercoagulable and autoimmune work up negative VTE prophylaxis -Lovenox 40 mg No antithrombotic prior to admission, now on aspirin 81 mg and Plavix 75mg  DAPT for 3 weeks and then ASA alone Therapy recommendations: CIR  Disposition: d/c to CIR 03/23/2020      ROS:   14 system review of systems performed and negative with exception of those listed in HPI  PMH:  Past Medical History:  Diagnosis Date   Allergy    Phreesia 04/10/2020   Depressed    Hyperlipidemia    Phreesia 04/10/2020   Hypertension    Phreesia 04/10/2020   Neuromuscular disorder ([place])    Phreesia 04/10/2020   Stroke ([place])    Phreesia 04/10/2020    PSH:  Past Surgical History:  Procedure Laterality Date   BUBBLE STUDY  03/21/2020   Procedure: BUBBLE STUDY;  Surgeon: [name], MD;  Location: [facility] ENDOSCOPY;  Service: Cardiology;;   PATENT FORAMEN OVALE(PFO) CLOSURE N/A 06/22/2020   Procedure: PATENT FORAMEN OVALE (PFO) CLOSURE;  Surgeon: Sherren Mocha, MD;  Location: [place] CV LAB;  Service: Cardiovascular;  Laterality: N/A;   TEE WITHOUT CARDIOVERSION N/A 03/21/2020   Procedure: TRANSESOPHAGEAL ECHOCARDIOGRAM (TEE);  Surgeon: [name], MD;  Location: [place];  Service: Cardiology;  Laterality: N/A;    Social History:  Social History   Socioeconomic History   Marital status: Married    Spouse name: Not on file   Number of children: Not on file   Years of education: Not on file   Highest education level: Not on file  Occupational History   Not on file  Tobacco Use   Smoking status: Never   Smokeless tobacco: Never  Substance and Sexual Activity   Alcohol use: Not on file   Drug use: Not on  file   Sexual activity: Not on file  Other Topics Concern   Not on file  Social History Narrative   Not on file   Social Determinants of Health   Financial Resource Strain: Not on file  Food Insecurity: Not on file  Transportation Needs: Not on file  Physical Activity: Not on file  Stress: Not on file  Social Connections: Not on file  Intimate Partner Violence: Not on file    Family History:  Family History  Problem Relation Age of Onset   Stroke Paternal Grandfather    Stroke Maternal Aunt     Medications:   Current Outpatient Medications on File Prior to Visit  Medication Sig Dispense Refill   amoxicillin (AMOXIL) 500 MG capsule Take 4 capsules one hour prior to dental work 4 capsule 3   aspirin EC 81 MG tablet Take 1 tablet (81 mg total) by mouth daily. Swallow whole. 150 tablet 2   clopidogrel (PLAVIX) 75 MG tablet Take 1 tablet (75 mg total) by mouth daily. 90 tablet 1   Multiple Vitamin (MULTIVITAMIN) tablet Take 1 tablet by mouth daily.     sertraline (ZOLOFT) 50 MG tablet Take 1 tablet by mouth every morning.  No current facility-administered medications on file prior to visit.    Allergies:   Allergies  Allergen Reactions   Albumen, Egg Itching   Other Hives    Chicken meat, [place]   Eggs Or Egg-Derived Products Itching      OBJECTIVE:  Physical Exam  Vitals:   08/17/20 1237  BP: 121/78  Pulse: 76  Weight: 154 lb (69.9 kg)  Height: 5\' 9"  (1.753 m)    Body mass index is 22.74 kg/m. No results found.  General: well developed, well nourished,  very pleasant middle-age Caucasian male, seated, in no evident distress Head: head normocephalic and atraumatic.   Neck: supple with no carotid or supraclavicular bruits Cardiovascular: regular rate and rhythm, no murmurs Musculoskeletal: no deformity Skin:  no rash/petichiae Vascular:  Normal pulses all extremities   Neurologic Exam Mental Status: Awake and fully alert.   Mild expressive aphasia  with speech hesitancy.  No evidence of receptive aphasia or dysarthria.  Follows commands without difficulty.  Oriented to place and time. Recent and remote memory intact. Attention span, concentration and fund of knowledge appropriate.  Mood and affect appropriate.  Cranial Nerves: Pupils equal, briskly reactive to light. Extraocular movements full without nystagmus. Visual fields full to confrontation. Hearing intact. Facial sensation intact. Face, tongue, palate moves normally and symmetrically.  Motor: Normal bulk and tone. Normal strength in all tested extremity muscles except slightly decreased right hand dexterity Sensory.: intact to touch , pinprick , position and vibratory sensation except slight decreased light touch sensation RUE from elbow down Coordination: Rapid alternating movements normal in all extremities except slightly decreased right hand. Finger-to-nose mild RUE apraxia and heel-to-shin performed accurately bilaterally. Gait and Station: Arises from chair without difficulty. Stance is normal. Gait demonstrates normal stride length and balance with without use of assistive device. Tandem walk and heel toe without difficulty.  Reflexes: 1+ and symmetric. Toes downgoing.         ASSESSMENT: Ayaz Sondgeroth is a 45 y.o. year old male presented with acute onset of dense expressive and receptive aphasia and right-sided weakness on 03/16/2020 with stroke work-up revealing acute ischemic infarct in the left MCA distribution s/p tPA likely embolic secondary to unclear source. Vascular risk factors include +PFO on TCD and TEE, HLD and THC use.      PLAN:  Left MCA stroke:  Residual deficit: right hand decreased dexterity and sensory impairment and occasional speech difficulties  Continue working with outpatient OT for likely ongoing recovery Continue aspirin 81 mg daily and Plavix (s/p PFO closure) and atorvastatin for secondary stroke prevention.   Discussed secondary stroke  prevention measures and importance of close PCP follow up for aggressive stroke risk factor management  PFO: As evidenced on TCD and TEE. ROPE score 6 indicating 62% likelihood stroke was due to PFO.  S/p closure 10/21/1192 without complication.  Plan on DAPT for 6 months postprocedure and aspirin alone.  Cardiology advised follow-up 1 year postprocedure for repeat echo with bubble HTN: BP goal <130/90.  Stable on lisinopril per PCP HLD: LDL goal <70. Recent LDL 105 down from 149 -continue atorvastatin 80 mg daily routinely monitored by PCP     Follow up in 6 months or call earlier if needed   CC:  [facility] provider: Dr. Desma Maxim, Amy J, NP   I spent 35 minutes of face-to-face and non-face-to-face time with patient.  This included previsit chart review, lab review, study review, order entry, electronic health record documentation, patient education regarding prior stroke  and possible etiology as well as secondary stroke prevention and aggressive stroke risk factor management, residual deficits and possible further recovery, s/p PFO closure and management follow-up with cardiology and answered all other questions to patient satisfaction  Frann Rider, [facility]  [facility] [address] [place], [place] [address]  Phone [phone] Fax [phone] Note: This document was prepared with digital dictation and possible smart phrase technology. Any transcriptional errors that result from this process are unintentional.

## 2020-08-18 ENCOUNTER — Ambulatory Visit: Payer: BC Managed Care – PPO | Admitting: Occupational Therapy

## 2020-08-18 ENCOUNTER — Other Ambulatory Visit: Payer: Self-pay

## 2020-08-18 DIAGNOSIS — I69359 Hemiplegia and hemiparesis following cerebral infarction affecting unspecified side: Secondary | ICD-10-CM | POA: Diagnosis not present

## 2020-08-18 DIAGNOSIS — R278 Other lack of coordination: Secondary | ICD-10-CM

## 2020-08-18 DIAGNOSIS — M6281 Muscle weakness (generalized): Secondary | ICD-10-CM

## 2020-08-18 DIAGNOSIS — R482 Apraxia: Secondary | ICD-10-CM | POA: Diagnosis not present

## 2020-08-18 NOTE — Therapy (Signed)
[place] MAIN [facility] SERVICES [address] [place], [place], [address] Phone: [phone]   Fax:  [phone]  Occupational Therapy Progress Note                                                              Dates of reporting period  05/11/2020   to  08/18/2020   Patient Details  Name: Cody Henson MRN: [id number] Date of Birth: [birth year] No data recorded  Encounter Date: 08/18/2020   OT End of Session - 08/18/20 1516     Visit Number 20    Number of Visits 24    Date for OT Re-Evaluation 09/28/20    OT Start Time 1300    OT Stop Time 1345    OT Time Calculation (min) 45 min    Activity Tolerance Patient tolerated treatment well    Behavior During Therapy [facility] for tasks assessed/performed             Past Medical History:  Diagnosis Date   Allergy    Phreesia 04/10/2020   Depressed    Hyperlipidemia    Phreesia 04/10/2020   Hypertension    Phreesia 04/10/2020   Neuromuscular disorder ([place])    Phreesia 04/10/2020   Stroke [facility])    Phreesia 04/10/2020    Past Surgical History:  Procedure Laterality Date   BUBBLE STUDY  03/21/2020   Procedure: BUBBLE STUDY;  Surgeon: [name], MD;  Location: [facility] ENDOSCOPY;  Service: Cardiology;;   PATENT FORAMEN OVALE(PFO) CLOSURE N/A 06/22/2020   Procedure: PATENT FORAMEN OVALE (PFO) CLOSURE;  Surgeon: Sherren Mocha, MD;  Location: [place] CV LAB;  Service: Cardiovascular;  Laterality: N/A;   TEE WITHOUT CARDIOVERSION N/A 03/21/2020   Procedure: TRANSESOPHAGEAL ECHOCARDIOGRAM (TEE);  Surgeon: [name], MD;  Location: [place];  Service: Cardiology;  Laterality: N/A;    There were no vitals filed for this visit.   Subjective Assessment - 08/18/20 1515     Subjective  Pt reports he's doing well today.    Pertinent History Per medial records, Cody Henson is a 45 y.o. male with history of depression who was admitted on 03/16/20 with right sided weakness, facial weakness  and aphasia. CT head showed evolving hypodensity in mid/posterior left frontal lobe and CTA was negative for LVO. UDS showed THC. He received tPA and follow up MRI showed patchy multifocal acute ischemic infarcts in left cerebral hemisphere without hemorrhagic transformation.    Patient Stated Goals Pt would like to be as independent as possible, wants to drive and return to work.    Currently in Pain? No/denies            OT TREATMENT    Neuro muscular re-education:  Pt. worked on tasks to sustain lateral pinch on resistive tweezers while grasping and moving 2" toothpick sticks from a horizontal flat position to a vertical position in order to place it in the holder. Pt. worked on sustaining grasp while positioning and extending the wrist/hand in the necessary alignment needed to place the stick through the top of the holder. Pt. required cues to incorporate his right hand during the task. Pt. worked on Media planner with emphasis placed on promoting a mature grasp on the pen, and incorporating the 3rd digit into  the grasp.  Therapeutic Exercise:  Pt. worked on [employer], and reciprocal motion using the UBE in standing for 8 min. with no resistance. Constant monitoring was provided. Pt. worked on Chief Operating Officer in the left hand for 3pt. pinch using yellow, red, green, and blue resistive clips. Pt. worked on placing the clips at various vertical and horizontal angles. Tactile and verbal cues were required for eliciting the desired movement.   Pt. is making progress with the right hand. Pt. is engaging his right hand more during ADLs, and IADL tasks at home. Pt. continues to require cues to engage his right 3rd digit into position when formulating a 3pt. Pinch, and grasping objects including tweezers, and pens. Pt. Continues to work on improving UE strength,  motor control, and [facility] skills in order to work towards increasing engagement of the RUE during daily tasks in order to  improve, and maximize independence with ADLs, and IADL tasks.                         OT Education - 08/18/20 1515     Education Details RUE functioning, writing    Person(s) Educated Patient    Methods Explanation;Demonstration    Comprehension Verbalized understanding;Returned demonstration                 OT Long Term Goals - 08/18/20 1517       OT LONG TERM GOAL #1   Title Pt will be independent with home exercise program    Baseline no program at eval, 05/11/20: ongoing changes to HEP 07/06/2020: Independent with Home exercises. Exercises to be added in an ongoing basis.    Time 12    Period Weeks    Status On-going    Target Date 09/28/20      OT LONG TERM GOAL #2   Title Pt will complete dressing skills, UE and LE with modified independence    Baseline Pt. is able to dress himself, and tie his shoes, however the process slow, and requires increased time.    Time 12    Period Weeks    Status On-going    Target Date 09/28/20      OT LONG TERM GOAL #3   Title Pt will improve right grip strength by 15# to assist with cutting meat with modified independence.    Baseline Eval unable, 05/11/2020: Difficulty with cutting meat secondary to decreased sensation however grip strength has improved dramatically. 07/06/2020: Pt. is steadily progressing with grip strength.  Pt. is able to cutt meat now, however has difficulty cutting boxes.    Time 12    Period Weeks    Status Partially Met    Target Date 09/28/20      OT LONG TERM GOAL #5   Title Patient will improve typing speed greater than 12 WPM to answer emails and perform work tasks.    Baseline Pt. continues to present with limited typing speed.    Time 12    Period Weeks    Status Partially Met    Target Date 09/28/20      OT LONG TERM GOAL #7   Title Patient will improve hand function to write and sign his name on important papers with legibilty greater than 50%    Baseline 50% legibility for  name only after multiple attempts. Pt. continues to work on writing legibility    Time 12    Period Weeks    Status On-going  Target Date 09/28/20      OT LONG TERM GOAL #8   Title Pt will improve FOTO score to 65 or greater to show clinically relevant change to engage in ADL and IADL tasks with greater independence.    Baseline Eval 51; 07/05/2020: 59    Time 12    Period Weeks    Status On-going    Target Date 09/28/20      OT LONG TERM GOAL  #9   TITLE Pt. will be able to independently tie knot in fishing line.    Baseline 07/06/2020: Pt. has difficulty tying a knot in fishing line.    Time 12    Period Weeks    Status On-going    Target Date 09/28/20                   Plan - 08/18/20 1516     Clinical Impression Statement Pt. is making progress with the right hand. Pt. is engaging his right hand more during ADLs, and IADL tasks at home. Pt. continues to require cues to engage his right 3rd digit into position when formulating a 3pt. Pinch, and grasping objects including tweezers, and pens. Pt. Continues to work on improving UE strength,  motor control, and [facility] skills in order to work towards increasing engagement of the RUE during daily tasks in order to improve, and maximize independence with ADLs, and IADL tasks.   OT Occupational Profile and History Detailed Assessment- Review of Records and additional review of physical, cognitive, psychosocial history related to current functional performance    Occupational performance deficits (Please refer to evaluation for details): ADL's;IADL's;Work;Leisure;Social Participation    Body Structure / Function / Physical Skills ADL;Dexterity;Strength;Balance;Coordination;FMC;IADL;Endurance;Sensation;UE functional use;Proprioception    Psychosocial Skills Environmental  Adaptations;Habits;Routines and Behaviors    Rehab Potential Good    Clinical Decision Making Several treatment options, min-mod task modification necessary     Comorbidities Affecting Occupational Performance: May have comorbidities impacting occupational performance    Modification or Assistance to Complete Evaluation  No modification of tasks or assist necessary to complete eval    OT Frequency 2x / week    OT Duration 12 weeks    OT Treatment/Interventions Self-care/ADL training;Cryotherapy;Therapeutic exercise;DME and/or AE instruction;Balance training;Neuromuscular education;Manual Therapy;Moist Heat;Contrast Bath;Therapeutic activities;Patient/family education    Consulted and Agree with Plan of Care Patient             Patient will benefit from skilled therapeutic intervention in order to improve the following deficits and impairments:   Body Structure / Function / Physical Skills: ADL, Dexterity, Strength, Balance, Coordination, FMC, IADL, Endurance, Sensation, UE functional use, Proprioception   Psychosocial Skills: Environmental  Adaptations, Habits, Routines and Behaviors   Visit Diagnosis: Muscle weakness (generalized)  Other lack of coordination    Problem List Patient Active Problem List   Diagnosis Date Noted   PFO (patent foramen ovale) 06/22/2020   Transaminitis    Hemiparesis affecting dominant side as late effect of stroke (HCC)    Blood pressure increase diastolic    Left middle cerebral artery stroke ([place]) 03/23/2020   Urinary retention    Dyslipidemia    Benign essential HTN    Acute ischemic stroke (HCC)    Global aphasia    Depression    Hyperglycemia    Right hemiparesis (HCC)    Aphasia    Elevated blood pressure reading    Stroke (cerebrum) ([place]) 03/17/2020   Enthesopathy of hip region on both sides 01/26/2019  Chronic right shoulder pain 12/10/2014   Scapular dyskinesis 07/29/2014   Bursitis of right shoulder 03/01/2014    Harrel Carina, MS, OTR/L 08/18/2020, 3:24 PM  [place] MAIN [facility] SERVICES [address] [place], [place], [address] Phone:  [phone]   Fax:  [phone]  Name: Cody Henson MRN: [id number] Date of Birth: February 04, 1976

## 2020-08-19 NOTE — Progress Notes (Signed)
I agree with the above plan 

## 2020-10-19 DIAGNOSIS — F4323 Adjustment disorder with mixed anxiety and depressed mood: Secondary | ICD-10-CM | POA: Diagnosis not present

## 2020-11-02 DIAGNOSIS — F4323 Adjustment disorder with mixed anxiety and depressed mood: Secondary | ICD-10-CM | POA: Diagnosis not present

## 2020-11-16 DIAGNOSIS — F4323 Adjustment disorder with mixed anxiety and depressed mood: Secondary | ICD-10-CM | POA: Diagnosis not present

## 2020-11-18 ENCOUNTER — Other Ambulatory Visit: Payer: Self-pay | Admitting: Family

## 2020-11-18 DIAGNOSIS — I10 Essential (primary) hypertension: Secondary | ICD-10-CM

## 2020-11-24 DIAGNOSIS — F4323 Adjustment disorder with mixed anxiety and depressed mood: Secondary | ICD-10-CM | POA: Diagnosis not present

## 2020-11-28 ENCOUNTER — Other Ambulatory Visit: Payer: Self-pay

## 2020-11-28 ENCOUNTER — Ambulatory Visit: Payer: BC Managed Care – PPO | Admitting: Dermatology

## 2020-11-28 DIAGNOSIS — B360 Pityriasis versicolor: Secondary | ICD-10-CM | POA: Diagnosis not present

## 2020-11-28 DIAGNOSIS — Z1283 Encounter for screening for malignant neoplasm of skin: Secondary | ICD-10-CM

## 2020-11-28 DIAGNOSIS — L573 Poikiloderma of Civatte: Secondary | ICD-10-CM

## 2020-11-28 DIAGNOSIS — D229 Melanocytic nevi, unspecified: Secondary | ICD-10-CM

## 2020-11-28 DIAGNOSIS — L821 Other seborrheic keratosis: Secondary | ICD-10-CM

## 2020-11-28 DIAGNOSIS — L219 Seborrheic dermatitis, unspecified: Secondary | ICD-10-CM | POA: Diagnosis not present

## 2020-11-28 DIAGNOSIS — D1800 Hemangioma unspecified site: Secondary | ICD-10-CM

## 2020-11-28 DIAGNOSIS — L814 Other melanin hyperpigmentation: Secondary | ICD-10-CM

## 2020-11-28 DIAGNOSIS — B353 Tinea pedis: Secondary | ICD-10-CM

## 2020-11-28 DIAGNOSIS — L578 Other skin changes due to chronic exposure to nonionizing radiation: Secondary | ICD-10-CM

## 2020-11-28 MED ORDER — TERBINAFINE HCL 250 MG PO TABS: 250.0000 mg | ORAL_TABLET | Freq: Every day | ORAL | 0 refills | Status: DC

## 2020-11-28 MED ORDER — KETOCONAZOLE 2 % EX SHAM: MEDICATED_SHAMPOO | CUTANEOUS | 6 refills | Status: DC

## 2020-11-28 MED ORDER — HYDROCORTISONE 2.5 % EX CREA: TOPICAL_CREAM | CUTANEOUS | 3 refills | Status: DC

## 2020-11-28 NOTE — Patient Instructions (Signed)

## 2020-11-28 NOTE — Progress Notes (Signed)
New Patient Visit  Subjective  Cody Henson is a 45 y.o. male who presents for the following: Annual Exam (No hx of skin CA or dysplastic nevi ). Patient does has noticed some scale, peeling, and redness on his face and is concerned her may have a fungal infection.  Patient had a stroke associated with a cardiac septal defect. The patient presents for Total-Body Skin Exam (TBSE) for skin cancer screening and mole check.  The following portions of the chart were reviewed this encounter and updated as appropriate:   Tobacco  Allergies  Meds  Problems  Med Hx  Surg Hx  Fam Hx     Review of Systems:  No other skin or systemic complaints except as noted in HPI or Assessment and Plan.  Objective  Well appearing patient in no apparent distress; mood and affect are within normal limits.  A full examination was performed including scalp, head, eyes, ears, nose, lips, neck, chest, axillae, abdomen, back, buttocks, bilateral upper extremities, bilateral lower extremities, hands, feet, fingers, toes, fingernails, and toenails. All findings within normal limits unless otherwise noted below.  Face and scalp Pink patches with greasy scale.   Neck/chest Actinic damage.   Chest and buttocks Hypopigmented patches.  B/L foot Scale of the feet.   Assessment & Plan  Seborrheic dermatitis Face and scalp  Seborrheic Dermatitis  -  is a chronic persistent rash characterized by pinkness and scaling most commonly of the mid face but also can occur on the scalp (dandruff), ears; mid chest, mid back and groin.  It tends to be exacerbated by stress and cooler weather.  People who have neurologic disease may experience new onset or exacerbation of existing seborrheic dermatitis.  The condition is not curable but treatable and can be controlled.  Start Ketoconazole 2% shampoo QD in the shower to the scalp and face. Let sit 5-10 minutes before washing off. Start HC 2.5% cream 3d/wk to aa's face.    ketoconazole (NIZORAL) 2 % shampoo - Face and scalp Shampoo into the face, scalp, chest, and buttocks let sit 5-10 minutes then wash off. Use 3d/wk.  hydrocortisone 2.5 % cream - Face and scalp Apply to aa's face QD 3d/wk.  Poikiloderma of [place] Neck/chest Discussed treatment options with laser/BBL.  Advised will likely require up to 6 treatments to get moderate results.  Benign-appearing.  Observation.  Call clinic for new or changing lesions.  Recommend daily use of broad spectrum spf 30+ sunscreen to sun-exposed areas.   Tinea versicolor Chest and buttocks Chronic and persistent -and recurrent Start Ketoconazole 2% shampoo wash from the scalp to the buttocks let sit 5-10 minutes then wash off. Use 3d/wk.   Tinea pedis of both feet B/L foot Reviewed labs from 04/2020 - Chronic and persistent Start Terbinafine 250mg  po QD #30 0RF. Recheck at f/u appt.  terbinafine (LAMISIL) 250 MG tablet - B/L foot Take 1 tablet (250 mg total) by mouth daily.  Skin cancer screening  Lentigines - Scattered tan macules - Due to sun exposure - Benign-appearing, observe - Recommend daily broad spectrum sunscreen SPF 30+ to sun-exposed areas, reapply every 2 hours as needed. - Call for any changes  Seborrheic Keratoses - Stuck-on, waxy, tan-brown papules and/or plaques  - Benign-appearing - Discussed benign etiology and prognosis. - Observe - Call for any changes  Melanocytic Nevi - Tan-brown and/or pink-flesh-colored symmetric macules and papules - Benign appearing on exam today - Observation - Call clinic for new or changing moles - Recommend  daily use of broad spectrum spf 30+ sunscreen to sun-exposed areas.   Hemangiomas - Red papules - Discussed benign nature - Observe - Call for any changes  Actinic Damage - Chronic condition, secondary to cumulative UV/sun exposure - diffuse scaly erythematous macules with underlying dyspigmentation - Recommend daily broad spectrum  sunscreen SPF 30+ to sun-exposed areas, reapply every 2 hours as needed.  - Staying in the shade or wearing long sleeves, sun glasses (UVA+UVB protection) and wide brim hats (4-inch brim around the entire circumference of the hat) are also recommended for sun protection.  - Call for new or changing lesions.  Skin cancer screening performed today.  History of recent stroke from blood clot associated with cardiac septal defect  Return in about 2 months (around 01/28/2021) for tinea and seb derm f/u .  Luther Redo, CMA, am acting as scribe for Sarina Ser, MD . Documentation: I have reviewed the above documentation for accuracy and completeness, and I agree with the above.  Sarina Ser, MD

## 2020-11-29 ENCOUNTER — Encounter: Payer: Self-pay | Admitting: Dermatology

## 2020-12-06 ENCOUNTER — Other Ambulatory Visit: Payer: Self-pay | Admitting: Family

## 2020-12-06 DIAGNOSIS — I10 Essential (primary) hypertension: Secondary | ICD-10-CM

## 2020-12-13 ENCOUNTER — Encounter: Payer: Self-pay | Admitting: Family

## 2020-12-19 ENCOUNTER — Other Ambulatory Visit: Payer: Self-pay

## 2020-12-19 DIAGNOSIS — I10 Essential (primary) hypertension: Secondary | ICD-10-CM

## 2020-12-19 MED ORDER — LISINOPRIL 10 MG PO TABS: 10.0000 mg | ORAL_TABLET | Freq: Every day | ORAL | 0 refills | Status: DC

## 2020-12-28 DIAGNOSIS — F4323 Adjustment disorder with mixed anxiety and depressed mood: Secondary | ICD-10-CM | POA: Diagnosis not present

## 2021-01-11 DIAGNOSIS — F4323 Adjustment disorder with mixed anxiety and depressed mood: Secondary | ICD-10-CM | POA: Diagnosis not present

## 2021-01-25 ENCOUNTER — Other Ambulatory Visit: Payer: Self-pay | Admitting: Family

## 2021-01-25 DIAGNOSIS — E785 Hyperlipidemia, unspecified: Secondary | ICD-10-CM

## 2021-01-25 DIAGNOSIS — F4323 Adjustment disorder with mixed anxiety and depressed mood: Secondary | ICD-10-CM | POA: Diagnosis not present

## 2021-01-26 MED ORDER — ATORVASTATIN CALCIUM 80 MG PO TABS: 80.0000 mg | ORAL_TABLET | Freq: Every day | ORAL | 0 refills | Status: DC

## 2021-01-30 ENCOUNTER — Ambulatory Visit: Payer: BC Managed Care – PPO | Admitting: Dermatology

## 2021-01-30 ENCOUNTER — Other Ambulatory Visit: Payer: Self-pay

## 2021-01-30 DIAGNOSIS — B353 Tinea pedis: Secondary | ICD-10-CM | POA: Diagnosis not present

## 2021-01-30 DIAGNOSIS — L820 Inflamed seborrheic keratosis: Secondary | ICD-10-CM

## 2021-01-30 DIAGNOSIS — L219 Seborrheic dermatitis, unspecified: Secondary | ICD-10-CM

## 2021-01-30 MED ORDER — TERBINAFINE HCL 250 MG PO TABS: 250.0000 mg | ORAL_TABLET | Freq: Every day | ORAL | 0 refills | Status: DC

## 2021-01-30 MED ORDER — KETOCONAZOLE 2 % EX SHAM: MEDICATED_SHAMPOO | CUTANEOUS | 6 refills | Status: DC

## 2021-01-30 MED ORDER — HYDROCORTISONE 2.5 % EX CREA: TOPICAL_CREAM | CUTANEOUS | 3 refills | Status: DC

## 2021-01-30 NOTE — Progress Notes (Signed)
Follow-Up Visit   Subjective  Cody Henson is a 45 y.o. male who presents for the following: tinea pedis (Of the feet - S/P Lamisil 250mg  po QD, improved per patient), Seborrheic Dermatitis (Of the face and scalp - improved after taking oral Lamisil, using Ketoconazole 2% shampoo on the scalp and HC 2.5% cream on the face PRN. ), and Irritated skin lesion (On the post scalp - patient states that it started off crusted, and now he picks at it. He would like it removed today if possible. ).  The following portions of the chart were reviewed this encounter and updated as appropriate:   Tobacco  Allergies  Meds  Problems  Med Hx  Surg Hx  Fam Hx     Review of Systems:  No other skin or systemic complaints except as noted in HPI or Assessment and Plan.  Objective  Well appearing patient in no apparent distress; mood and affect are within normal limits.  A focused examination was performed including the scalp, feet, and face. Relevant physical exam findings are noted in the Assessment and Plan.  B/L foot Some scale of the heels.  post scalp x 1 Erythematous keratotic or waxy stuck-on papule or plaque.    Assessment & Plan  Tinea pedis of left foot B/L foot Chronic and persistent.  Improved but not to goal. Restart Lamisil 250mg  po QD for one more month. Terbinafine Counseling  Terbinafine is an anti-fungal medicine that can be applied to the skin (over the counter) or taken by mouth (prescription) to treat fungal infections. The pill version is often used to treat fungal infections of the nails or scalp. While most people do not have any side effects from taking terbinafine pills, some possible side effects of the medicine can include taste changes, headache, loss of smell, vision changes, nausea, vomiting, or diarrhea.   Rare side effects can include irritation of the liver, allergic reaction, or decrease in blood counts (which may show up as not feeling well or developing an  infection). If you are concerned about any of these side effects, please stop the medicine and call your doctor, or in the case of an emergency such as feeling very unwell, seek immediate medical care.   Seborrheic dermatitis Face and scalp  Seborrheic Dermatitis  -  is a chronic persistent rash characterized by pinkness and scaling most commonly of the mid face but also can occur on the scalp (dandruff), ears; mid chest, mid back and groin.  It tends to be exacerbated by stress and cooler weather.  People who have neurologic disease may experience new onset or exacerbation of existing seborrheic dermatitis.  The condition is not curable but treatable and can be controlled.  Continue Ketoconazole 2% shampoo to face and scalp 3d/wk and HC 2.5% cream to aa's face 3d/wk PRN.   Related Medications ketoconazole (NIZORAL) 2 % shampoo Shampoo into the face, scalp, chest, and buttocks let sit 5-10 minutes then wash off. Use 3d/wk.  hydrocortisone 2.5 % cream Apply to aa's face QD 3d/wk.  Inflamed seborrheic keratosis post scalp x 1 Destruction of lesion - post scalp x 1 Complexity: simple   Destruction method: cryotherapy   Informed consent: discussed and consent obtained   Timeout:  patient name, date of birth, surgical site, and procedure verified Lesion destroyed using liquid nitrogen: Yes   Region frozen until ice ball extended beyond lesion: Yes   Outcome: patient tolerated procedure well with no complications   Post-procedure details: wound care  instructions given    Return in about 1 year (around 01/30/2022) for medication refill.  Luther Redo, CMA, am acting as scribe for Sarina Ser, MD . Documentation: I have reviewed the above documentation for accuracy and completeness, and I agree with the above.  Sarina Ser, MD

## 2021-01-30 NOTE — Patient Instructions (Signed)

## 2021-02-04 ENCOUNTER — Encounter: Payer: Self-pay | Admitting: Dermatology

## 2021-02-08 DIAGNOSIS — F4323 Adjustment disorder with mixed anxiety and depressed mood: Secondary | ICD-10-CM | POA: Diagnosis not present

## 2021-02-24 DIAGNOSIS — M76821 Posterior tibial tendinitis, right leg: Secondary | ICD-10-CM | POA: Diagnosis not present

## 2021-02-24 DIAGNOSIS — M25571 Pain in right ankle and joints of right foot: Secondary | ICD-10-CM | POA: Diagnosis not present

## 2021-03-08 DIAGNOSIS — F4323 Adjustment disorder with mixed anxiety and depressed mood: Secondary | ICD-10-CM | POA: Diagnosis not present

## 2021-03-13 DIAGNOSIS — M76821 Posterior tibial tendinitis, right leg: Secondary | ICD-10-CM | POA: Diagnosis not present

## 2021-03-13 DIAGNOSIS — X58XXXA Exposure to other specified factors, initial encounter: Secondary | ICD-10-CM | POA: Diagnosis not present

## 2021-03-13 DIAGNOSIS — M25571 Pain in right ankle and joints of right foot: Secondary | ICD-10-CM | POA: Diagnosis not present

## 2021-03-13 DIAGNOSIS — S93491A Sprain of other ligament of right ankle, initial encounter: Secondary | ICD-10-CM | POA: Diagnosis not present

## 2021-03-16 ENCOUNTER — Ambulatory Visit: Payer: BC Managed Care – PPO | Admitting: Dermatology

## 2021-03-22 DIAGNOSIS — F4323 Adjustment disorder with mixed anxiety and depressed mood: Secondary | ICD-10-CM | POA: Diagnosis not present

## 2021-03-28 ENCOUNTER — Other Ambulatory Visit: Payer: Self-pay | Admitting: Family

## 2021-03-28 DIAGNOSIS — I10 Essential (primary) hypertension: Secondary | ICD-10-CM

## 2021-03-29 ENCOUNTER — Other Ambulatory Visit (HOSPITAL_COMMUNITY): Payer: Self-pay

## 2021-03-29 DIAGNOSIS — F4323 Adjustment disorder with mixed anxiety and depressed mood: Secondary | ICD-10-CM | POA: Diagnosis not present

## 2021-03-29 MED ORDER — LISINOPRIL 10 MG PO TABS
10.0000 mg | ORAL_TABLET | Freq: Every day | ORAL | 0 refills | Status: DC
  Filled 2021-03-29 – 2021-05-10 (×2): qty 30, 30d supply, fill #0

## 2021-04-06 DIAGNOSIS — F4323 Adjustment disorder with mixed anxiety and depressed mood: Secondary | ICD-10-CM | POA: Diagnosis not present

## 2021-04-13 DIAGNOSIS — F4323 Adjustment disorder with mixed anxiety and depressed mood: Secondary | ICD-10-CM | POA: Diagnosis not present

## 2021-04-20 DIAGNOSIS — F4323 Adjustment disorder with mixed anxiety and depressed mood: Secondary | ICD-10-CM | POA: Diagnosis not present

## 2021-05-04 DIAGNOSIS — F4323 Adjustment disorder with mixed anxiety and depressed mood: Secondary | ICD-10-CM | POA: Diagnosis not present

## 2021-05-10 ENCOUNTER — Other Ambulatory Visit (HOSPITAL_COMMUNITY): Payer: Self-pay

## 2021-05-11 NOTE — Progress Notes (Signed)
? ? ?Patient ID: Cody Henson, male    DOB: 08/05/1975  MRN: [id number] ? ?CC: Hypertension Follow-Up ? ?Subjective: ?Cody Henson is a 46 y.o. male who presents for hypertension follow-up.  ? ?His concerns today include:  ?HYPERTENSION FOLLOW-UP: ?08/17/2020: ?- Continue Lisinopril as prescribed.  ? ?05/12/2021: ?Doing well on current regimen. No side effects. No issues/concerns. Denies chest pain and shortness of breath.  ? ?2. HYPERLIPIDEMIA: ?Doing well on current Atorvastatin. No issues/concerns.  ? ?Patient Active Problem List  ? Diagnosis Date Noted  ? PFO (patent foramen ovale) 06/22/2020  ? Transaminitis   ? Hemiparesis affecting dominant side as late effect of stroke ([name])   ? Blood pressure increase diastolic   ? 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 Urinary retention   ? Dyslipidemia   ? Benign essential HTN   ? Acute ischemic stroke ([name])   ? Global aphasia   ? Depression   ? Hyperglycemia   ? Right hemiparesis ([name])   ? Aphasia   ? Elevated blood pressure reading   ? Stroke (cerebrum) ([name]) 03/17/2020  ? Enthesopathy of hip region on both sides 01/26/2019  ? Chronic right shoulder pain 12/10/2014  ? Scapular dyskinesis 07/29/2014  ? Bursitis of right shoulder 03/01/2014  ?  ? ?Current Outpatient Medications on File Prior to Visit  ?Medication Sig Dispense Refill  ? hydrocortisone 2.5 % cream Apply to aa's face QD 3d/wk. 90 g 3  ? ketoconazole (NIZORAL) 2 % shampoo Shampoo into the face, scalp, chest, and buttocks let sit 5-10 minutes then wash off. Use 3d/wk. 120 mL 6  ? metFORMIN (GLUCOPHAGE) 1000 MG tablet Take 1,000 mg by mouth daily.    ? Multiple Vitamin (MULTIVITAMIN) tablet Take 1 tablet by mouth daily.    ? sertraline (ZOLOFT) 50 MG tablet Take 1 tablet by mouth every morning. (Patient not taking: Reported on 01/30/2021)    ? terbinafine (LAMISIL) 250 MG tablet Take 1 tablet (250 mg total) by mouth daily. 30 tablet 0  ? UNABLE TO FIND Med Name: compounded Naltrexone 4 mg po QD     ? ?No current facility-administered medications on file prior to visit.  ? ? ?Allergies  ?Allergen Reactions  ? Egg White (Egg Protein) Itching  ? Other Hives  ?  Chicken meat, [place]  ? Eggs Or Egg-Derived Products Itching  ? ? ?Social History  ? ?Socioeconomic History  ? Marital status: Married  ?  Spouse name: Not on file  ? Number of children: Not on file  ? Years of education: Not on file  ? Highest education level: Not on file  ?Occupational History  ? Not on file  ?Tobacco Use  ? Smoking status: Never  ?  Passive exposure: Never  ? Smokeless tobacco: Never  ?Substance and Sexual Activity  ? Alcohol use: Not on file  ? Drug use: Not on file  ? Sexual activity: Not on file  ?Other Topics Concern  ? Not on file  ?Social History Narrative  ? Not on file  ? ?Social Determinants of Health  ? ?Financial Resource Strain: Not on file  ?Food Insecurity: Not on file  ?Transportation Needs: Not on file  ?Physical Activity: Not on file  ?Stress: Not on file  ?Social Connections: Not on file  ?Intimate Partner Violence: Not on file  ? ? ?Family History  ?Problem Relation Age of Onset  ? Stroke Paternal Grandfather   ? Stroke Maternal Aunt   ? ? ?Past Surgical History:  ?  Procedure Laterality Date  ? BUBBLE STUDY  03/21/2020  ? Procedure: BUBBLE STUDY;  Surgeon: [name], MD;  Location: [facility];  Service: Cardiology;;  ? PATENT FORAMEN OVALE(PFO) CLOSURE N/A 06/22/2020  ? Procedure: PATENT FORAMEN OVALE (PFO) CLOSURE;  Surgeon: Sherren Mocha, MD;  Location: [facility];  Service: Cardiovascular;  Laterality: N/A;  ? TEE WITHOUT CARDIOVERSION N/A 03/21/2020  ? Procedure: TRANSESOPHAGEAL ECHOCARDIOGRAM (TEE);  Surgeon: [name], MD;  Location: [facility];  Service: Cardiology;  Laterality: N/A;  ? ? ?ROS: ?Review of Systems ?Negative except as stated above ? ?PHYSICAL EXAM: ?BP 119/80 (BP Location: Left Arm, Patient Position: Sitting, Cuff Size: Normal)   Pulse (!) 104   Temp 98.3 ?F (36.8  ?C)   Resp 18   Ht 5' 9.02" (1.753 m)   Wt 148 lb (67.1 kg)   SpO2 95%   BMI 21.85 kg/m?  ? ?Physical Exam ?HENT:  ?   Head: Normocephalic and atraumatic.  ?Eyes:  ?   Extraocular Movements: Extraocular movements intact.  ?   Conjunctiva/sclera: Conjunctivae normal.  ?   Pupils: Pupils are equal, round, and reactive to light.  ?Cardiovascular:  ?   Rate and Rhythm: Normal rate and regular rhythm.  ?   Pulses: Normal pulses.  ?   Heart sounds: Normal heart sounds.  ?Pulmonary:  ?   Effort: Pulmonary effort is normal.  ?   Breath sounds: Normal breath sounds.  ?Musculoskeletal:  ?   Cervical back: Normal range of motion and neck supple.  ?Neurological:  ?   General: No focal deficit present.  ?   Mental Status: He is alert and oriented to person, place, and time.  ?Psychiatric:     ?   Mood and Affect: Mood normal.     ?   Behavior: Behavior normal.  ? ?ASSESSMENT AND PLAN: ?1. Essential (primary) hypertension: ?- Continue Lisinopril as prescribed.  ?- Counseled on blood pressure goal of less than 130/80, low-sodium, DASH diet, medication compliance, and 150 minutes of moderate intensity exercise per week as tolerated. Counseled on medication adherence and adverse effects. ?- Update BMP.  ?- Patient due for annual Cardiology appointment June 2023.  ?- Follow-up with primary provider in 4 months or sooner if needed.  ?- Basic Metabolic Panel ?- lisinopril (ZESTRIL) 10 MG tablet; Take 1 tablet (10 mg total) by mouth daily.  Dispense: 120 tablet; Refill: 0 ? ?2. Hyperlipidemia, unspecified hyperlipidemia type: ?- Continue Atorvastatin as prescribed.  ?- Update lipid panel.  ?- Follow-up with primary provider as scheduled.  ?- Lipid Panel ?- atorvastatin (LIPITOR) 80 MG tablet; Take 1 tablet (80 mg total) by mouth daily.  Dispense: 120 tablet; Refill: 0 ? ? ? ?Patient was given the opportunity to ask questions.  Patient verbalized understanding of the plan and was able to repeat key elements of the plan. Patient was  given clear instructions to go to Emergency Department or return to medical center if symptoms don't improve, worsen, or new problems develop.The patient verbalized understanding. ? ? ?Orders Placed This Encounter  ?Procedures  ? Basic Metabolic Panel  ? Lipid Panel  ? ? ?Requested Prescriptions  ? ?Signed Prescriptions Disp Refills  ? atorvastatin (LIPITOR) 80 MG tablet 120 tablet 0  ?  Sig: Take 1 tablet (80 mg total) by mouth daily.  ? lisinopril (ZESTRIL) 10 MG tablet 120 tablet 0  ?  Sig: Take 1 tablet (10 mg total) by mouth daily.  ? ? ?Return in  about 4 months (around 09/11/2021) for Follow-Up or next available hypertension. ? ?Camillia Herter, NP  ?

## 2021-05-12 ENCOUNTER — Other Ambulatory Visit: Payer: Self-pay

## 2021-05-12 ENCOUNTER — Encounter: Payer: Self-pay | Admitting: Family

## 2021-05-12 ENCOUNTER — Ambulatory Visit (INDEPENDENT_AMBULATORY_CARE_PROVIDER_SITE_OTHER): Payer: BC Managed Care – PPO | Admitting: Family

## 2021-05-12 VITALS — BP 119/80 | HR 104 | Temp 98.3°F | Resp 18 | Ht 69.02 in | Wt 148.0 lb

## 2021-05-12 DIAGNOSIS — I10 Essential (primary) hypertension: Secondary | ICD-10-CM | POA: Diagnosis not present

## 2021-05-12 DIAGNOSIS — E785 Hyperlipidemia, unspecified: Secondary | ICD-10-CM

## 2021-05-12 DIAGNOSIS — M76821 Posterior tibial tendinitis, right leg: Secondary | ICD-10-CM | POA: Diagnosis not present

## 2021-05-12 DIAGNOSIS — S86111A Strain of other muscle(s) and tendon(s) of posterior muscle group at lower leg level, right leg, initial encounter: Secondary | ICD-10-CM | POA: Diagnosis not present

## 2021-05-12 MED ORDER — ATORVASTATIN CALCIUM 80 MG PO TABS: 80.0000 mg | ORAL_TABLET | Freq: Every day | ORAL | 0 refills | Status: DC

## 2021-05-12 MED ORDER — LISINOPRIL 10 MG PO TABS: 10.0000 mg | ORAL_TABLET | Freq: Every day | ORAL | 0 refills | Status: DC

## 2021-05-12 NOTE — Progress Notes (Signed)
Pt presents for hypertension follow-up. Needs refills on Atorvastatin and Lisinopril  ?

## 2021-05-13 LAB — BASIC METABOLIC PANEL
BUN/Creatinine Ratio: 15 (ref 9–20)
BUN: 15 mg/dL (ref 6–24)
CO2: 24 mmol/L (ref 20–29)
Calcium: 10.2 mg/dL (ref 8.7–10.2)
Chloride: 100 mmol/L (ref 96–106)
Creatinine, Ser: 1.03 mg/dL (ref 0.76–1.27)
Glucose: 111 mg/dL — ABNORMAL HIGH (ref 70–99)
Potassium: 4.2 mmol/L (ref 3.5–5.2)
Sodium: 141 mmol/L (ref 134–144)
eGFR: 92 mL/min/{1.73_m2} (ref >59)

## 2021-05-13 LAB — LIPID PANEL
Chol/HDL Ratio: 5 ratio (ref 0.0–5.0)
Cholesterol, Total: 191 mg/dL (ref 100–199)
HDL: 38 mg/dL — ABNORMAL LOW (ref >39)
LDL Chol Calc (NIH): 123 mg/dL — ABNORMAL HIGH (ref 0–99)
Triglycerides: 168 mg/dL — ABNORMAL HIGH (ref 0–149)
VLDL Cholesterol Cal: 30 mg/dL (ref 5–40)

## 2021-05-13 NOTE — Progress Notes (Signed)
Kidney function normal.  ? ?Continue Atorvastatin for cholesterol maintenance.

## 2021-05-18 DIAGNOSIS — F4323 Adjustment disorder with mixed anxiety and depressed mood: Secondary | ICD-10-CM | POA: Diagnosis not present

## 2021-06-01 DIAGNOSIS — F4323 Adjustment disorder with mixed anxiety and depressed mood: Secondary | ICD-10-CM | POA: Diagnosis not present

## 2021-06-13 ENCOUNTER — Other Ambulatory Visit: Payer: Self-pay | Admitting: Family

## 2021-06-13 DIAGNOSIS — I10 Essential (primary) hypertension: Secondary | ICD-10-CM

## 2021-06-14 DIAGNOSIS — F4323 Adjustment disorder with mixed anxiety and depressed mood: Secondary | ICD-10-CM | POA: Diagnosis not present

## 2021-06-15 ENCOUNTER — Telehealth: Payer: Self-pay

## 2021-06-15 DIAGNOSIS — Q2112 Patent foramen ovale: Secondary | ICD-10-CM

## 2021-06-15 DIAGNOSIS — I639 Cerebral infarction, unspecified: Secondary | ICD-10-CM

## 2021-06-15 DIAGNOSIS — Z8774 Personal history of (corrected) congenital malformations of heart and circulatory system: Secondary | ICD-10-CM

## 2021-06-15 NOTE — Telephone Encounter (Signed)
There have been several attempts over the last few weeks to call and arrange 1 year PFO closure echo and office visit.  ? ?Left message to call back to arrange. ?

## 2021-06-21 NOTE — Telephone Encounter (Signed)
Left message to call back  

## 2021-06-21 NOTE — Addendum Note (Signed)
Addended by: Harland German A on: 06/21/2021 04:50 PM ? ? Modules accepted: Orders ? ?

## 2021-06-21 NOTE — Telephone Encounter (Signed)
Scheduled patient for echo and office visit with Nell Range on 07/12/21.  ?He was grateful for call and agrees with plan.  ?

## 2021-06-28 DIAGNOSIS — F4323 Adjustment disorder with mixed anxiety and depressed mood: Secondary | ICD-10-CM | POA: Diagnosis not present

## 2021-06-30 DIAGNOSIS — F4323 Adjustment disorder with mixed anxiety and depressed mood: Secondary | ICD-10-CM | POA: Diagnosis not present

## 2021-07-04 DIAGNOSIS — M216X1 Other acquired deformities of right foot: Secondary | ICD-10-CM | POA: Diagnosis not present

## 2021-07-04 DIAGNOSIS — Q666 Other congenital valgus deformities of feet: Secondary | ICD-10-CM | POA: Diagnosis not present

## 2021-07-04 DIAGNOSIS — S93691A Other sprain of right foot, initial encounter: Secondary | ICD-10-CM | POA: Diagnosis not present

## 2021-07-04 DIAGNOSIS — S86111A Strain of other muscle(s) and tendon(s) of posterior muscle group at lower leg level, right leg, initial encounter: Secondary | ICD-10-CM | POA: Diagnosis not present

## 2021-07-05 ENCOUNTER — Telehealth: Payer: Self-pay | Admitting: *Deleted

## 2021-07-05 NOTE — Telephone Encounter (Signed)
? ?  Pre-operative Risk Assessment  ?  ?Patient Name: Cody Henson  ?DOB: 05-Jul-1975 ?MRN: [id number]  ? ?  ? ?Request for Surgical Clearance   ? ?Procedure:  RIGHT POSTERIOR TIBIAL TENDON TENOTOMY , FDL TRANSFER, POSSIBLE SPRING LIGAMENT RECONSTRUCTION, MD CO ? ?Date of Surgery:  Clearance TBD (CLEARANCE FORM STATES NEXT WEEK BUT THERE IS NO DATE LISTED)                          ?   ?Surgeon:  DR. Anthoney Harada ?Surgeon's Group or Practice Name:  [facility] ?Phone number:  [phone] ?Fax number:  [phone] ?  ?Type of Clearance Requested:   ?- Medical  NO MEDICATIONS LISTED TO BE HELD ?  ?Type of Anesthesia:   NERVE BLOCK AROUND THE KNEE ?  ?Additional requests/questions:   ? ?Signed, ?Julaine Hua   ?07/05/2021, 4:32 PM  ? ?

## 2021-07-06 NOTE — Telephone Encounter (Signed)
   Patient Name: Cody Henson  DOB: 12/24/1975 MRN: [id number]  Primary Cardiologist: Dr. Burt Knack (structural heart team)  Chart reviewed as part of pre-operative protocol coverage. The patient already has an upcoming appointment scheduled 07/12/21 with Nell Range PA-C at which time this clearance should be addressed.   - I added "preop" comment to appointment notes so that provider is aware to address at time of OV. Per office protocol, the provider seeing this patient should forward their finalized clearance decision to requesting party below.  - Will fax update to requesting surgeon so they are aware. Will remove from preop box as separate preop APP input not necessary at this time.  Charlie Pitter, PA-C 07/06/2021, 3:53 PM

## 2021-07-07 ENCOUNTER — Telehealth: Payer: Self-pay | Admitting: Cardiology

## 2021-07-07 NOTE — Telephone Encounter (Signed)
   Name: Cody Henson  DOB: 13-Oct-1975  MRN: [id number]   Primary Cardiologist: Dr. Burt Knack, MD (PFO closure)  Chart reviewed as part of pre-operative protocol coverage. Patient was contacted 07/07/2021 in reference to pre-operative risk assessment for pending surgery as outlined below.  Garth Diffley was last seen on 08/01/2020 by Angelena Form, PA. Since that day, Savyon Loken has done exceptionally well with no new changes since PFO closure. He has no CV or neuro symptoms.   Therefore, based on ACC/AHA guidelines, the patient would be at acceptable risk for the planned procedure. He is scheduled for 1 year post PFO closure limited echocardiogram with bubble on 07/12/21 with provider visit to follow.   I will route this recommendation to the requesting party via Epic fax function and remove from pre-op pool. Please call with questions.   Kathyrn Drown, NP 07/07/2021, 2:25 PM

## 2021-07-10 DIAGNOSIS — Q666 Other congenital valgus deformities of feet: Secondary | ICD-10-CM | POA: Diagnosis not present

## 2021-07-10 DIAGNOSIS — S93691A Other sprain of right foot, initial encounter: Secondary | ICD-10-CM | POA: Diagnosis not present

## 2021-07-10 DIAGNOSIS — M216X1 Other acquired deformities of right foot: Secondary | ICD-10-CM | POA: Diagnosis not present

## 2021-07-10 DIAGNOSIS — S86111A Strain of other muscle(s) and tendon(s) of posterior muscle group at lower leg level, right leg, initial encounter: Secondary | ICD-10-CM | POA: Diagnosis not present

## 2021-07-10 NOTE — Progress Notes (Unsigned)
[facility] [place]                                     Cardiology Office Note:    Date:  07/12/2021   ID:  Cody Henson, DOB 1975-03-24, MRN [id number]  PCP:  Cody Herter, NP  [facility] Cardiologist:  None  [facility] Electrophysiologist:  None   Referring MD: Cody Herter, NP   1 year follow up s/p PFO closure   History of Present Illness:    Cody Henson is a 46 y.o. male with a hx of cryptogenic CVA and PFO s/p PFO closure (06/22/20) who presents to clinic for follow up.     On March 16, 2020, he developed acute onset of right hemiparesis, facial droop, and aphasia.  He was found to have an acute left MCA stroke without hemorrhage. The patient was treated with TPA. Follow-up MRI study showed mild scattered petechial hemorrhage but no frank hemorrhagic transformation. CTA study showed no evidence of large vessel vascular disease.  Bilateral venous Doppler studies were negative. Hypercoagulability panel was negative.  Transesophageal echo study demonstrated PFO with right to left shunting confirmed by agitated saline study. TCD bubble study was positive. He was seen by Dr. Burt Henson in 04/2020 for evaluation of percutaneous PFO closure. He was felt to be a good candidate for this.     He underwent successful transcatheter PFO closure using a 14 mm Amplatzer atrial septal occluder on 06/22/20. Post op echo showed stable position with no residual PFO/ASD. After closure he had some palpitations and Zio XT did not show any afib but occasional pSVT.  He is scheduled for right flatfoot reconstruction on 07/13/21.    Today he presents to clinic for follow up. No CP or SOB. No LE edema, orthopnea or PND. No dizziness or syncope. No blood in stool or urine. Has had some palpitations that resolved with increase in zoloft.    Past Medical History:  Diagnosis Date   Allergy    Phreesia 04/10/2020   Depressed    Hyperlipidemia    Phreesia  04/10/2020   Hypertension    Phreesia 04/10/2020   Neuromuscular disorder ([place])    Phreesia 04/10/2020   Stroke [facility])    Phreesia 04/10/2020    Past Surgical History:  Procedure Laterality Date   BUBBLE STUDY  03/21/2020   Procedure: BUBBLE STUDY;  Surgeon: Cody Munroe, MD;  Location: [facility] ENDOSCOPY;  Service: Cardiology;;   PATENT FORAMEN OVALE(PFO) CLOSURE N/A 06/22/2020   Procedure: PATENT FORAMEN OVALE (PFO) CLOSURE;  Surgeon: Cody Mocha, MD;  Location: [place] CV LAB;  Service: Cardiovascular;  Laterality: N/A;   TEE WITHOUT CARDIOVERSION N/A 03/21/2020   Procedure: TRANSESOPHAGEAL ECHOCARDIOGRAM (TEE);  Surgeon: Cody Munroe, MD;  Location: [place];  Service: Cardiology;  Laterality: N/A;    Current Medications: Current Meds  Medication Sig   aspirin EC 81 MG tablet Take 81 mg by mouth daily.   atorvastatin (LIPITOR) 80 MG tablet Take 1 tablet (80 mg total) by mouth daily.   hydrocortisone 2.5 % cream Apply to aa's face QD 3d/wk.   ketoconazole (NIZORAL) 2 % shampoo Shampoo into the face, scalp, chest, and buttocks let sit 5-10 minutes then wash off. Use 3d/wk.   lisinopril (ZESTRIL) 10 MG tablet Take 1 tablet (10 mg total) by mouth daily.   metFORMIN (GLUCOPHAGE) 1000  MG tablet Take 1,000 mg by mouth daily.   Multiple Vitamin (MULTIVITAMIN) tablet Take 1 tablet by mouth daily.   sertraline (ZOLOFT) 100 MG tablet Take 150 mg by mouth every morning.   UNABLE TO FIND Med Name: compounded Naltrexone 4 mg po QD     Allergies:   Egg white (egg protein), Other, and Eggs or egg-derived products   Social History   Socioeconomic History   Marital status: Married    Spouse name: Not on file   Number of children: Not on file   Years of education: Not on file   Highest education level: Not on file  Occupational History   Not on file  Tobacco Use   Smoking status: Never    Passive exposure: Never   Smokeless tobacco: Never  Substance and Sexual Activity    Alcohol use: Not on file   Drug use: Not on file   Sexual activity: Not on file  Other Topics Concern   Not on file  Social History Narrative   Not on file   Social Determinants of Health   Financial Resource Strain: Not on file  Food Insecurity: Not on file  Transportation Needs: Not on file  Physical Activity: Not on file  Stress: Not on file  Social Connections: Not on file     Family History: The patient's family history includes Stroke in his maternal aunt and paternal grandfather.  ROS:   Please see the history of present illness.    All other systems reviewed and are negative.  EKGs/Labs/Other Studies Reviewed:    The following studies were reviewed today:  06/22/20  PATENT FORAMEN OVALE (PFO) CLOSURE  Conclusion  Successful transcatheter PFO closure using a 14 mm Amplatzer atrial septal occluder under intracardiac echo and fluoroscopic guidance, guided with balloon-sizing     _________________________     06/22/20 Echo limited IMPRESSIONS   1. Limited study: Post PFO closure with 14 mm [place] Occluder. Stable  position with no residual PFO/ASD.    ____________________   Bryn Henson 07/2020 SUMMARY: The basic rhythm is normal sinus with an average HR of 65 bpm There is no atrial fibrillation  There are rare SVT runs, the longest lasting 24 seconds at a maximal heart rate of 156 bpm  ____________________  Echo with bubble 5/54/23 IMPRESSIONS  1. Left ventricular ejection fraction, by estimation, is 60 to 65%. The left ventricle has normal function. The left ventricle has no regional wall motion abnormalities.  2. Right ventricular systolic function is normal. The right ventricular size is normal.  3. Well-seated Amplatzer septal occluder device is seen, without residual shunt by color Doppler or saline contrast injection with the Valsalva maneuver. Agitated saline contrast bubble study was negative, with no evidence of any interatrial shunt.  4. The mitral  valve is normal in structure. No evidence of mitral valve regurgitation. No evidence of mitral stenosis.  5. The aortic valve is normal in structure. Aortic valve regurgitation is not visualized. No aortic stenosis is present.  6. There is borderline dilatation of the aortic root, measuring 39 mm.  7. The inferior vena cava is normal in size with greater than 50% respiratory variability, suggesting right atrial pressure of 3 mmHg.  EKG:  EKG is NOT ordered today.  Recent Labs: 05/12/2021: BUN 15; Creatinine, Ser 1.03; Potassium 4.2; Sodium 141  Recent Lipid Panel    Component Value Date/Time   CHOL 191 05/12/2021 1543   TRIG 168 (H) 05/12/2021 1543   HDL 38 (  L) 05/12/2021 1543   CHOLHDL 5.0 05/12/2021 1543   CHOLHDL 5.3 03/17/2020 0344   VLDL 23 03/17/2020 0344   LDLCALC 123 (H) 05/12/2021 1543     Risk Assessment/Calculations:       Physical Exam:    VS:  BP (!) 84/42   Pulse (!) 55   Ht '5\' 9"'$  (1.753 m)   Wt 144 lb (65.3 kg)   SpO2 97%   BMI 21.27 kg/m     Wt Readings from Last 3 Encounters:  07/12/21 144 lb (65.3 kg)  05/12/21 148 lb (67.1 kg)  08/17/20 154 lb (69.9 kg)     GEN: Well nourished, well developed in no acute distress HEENT: Normal NECK: No JVD; No carotid bruits LYMPHATICS: No lymphadenopathy CARDIAC: RRR, no murmurs, rubs, gallops RESPIRATORY:  Clear to auscultation without rales, wheezing or rhonchi  ABDOMEN: Soft, non-tender, non-distended MUSCULOSKELETAL:  No edema; No deformity  SKIN: Warm and dry NEUROLOGIC:  Alert and oriented x 3 PSYCHIATRIC:  Normal affect   ASSESSMENT:    1. S/P percutaneous patent foramen ovale closure   2. Cryptogenic stroke ([place])   3. Hypertension, unspecified type   4. Palpitations   5. Pes planus, unspecified laterality     PLAN:    In order of problems listed above:  S/p percutaneous PFO closure: doing well. Echo today with normal EF and negative bubble study. Continue lifelong aspirin. He will follow  with cardiology on as as needed basis.   Cryptogenic CVA: continue aspirin and statin  HTN: continue Lisinopril. BP 84/42. Patient thinks he is dehydrated today. Watches BP at home. I told him if it continues to be in the 46O or 03O sytolic, we should stop his lisinopril   Palpitations: previous monitor showed pSVT. He declined a BB.   Foot surgery: he is cleared for surgery tomorrow at [facility]    Medication Adjustments/Labs and Tests Ordered: Current medicines are reviewed at length with the patient today.  Concerns regarding medicines are outlined above.  No orders of the defined types were placed in this encounter.   No orders of the defined types were placed in this encounter.    Patient Instructions  Medication Instructions:  Your physician recommends that you continue on your current medications as directed. Please refer to the Current Medication list given to you today.  *If you need a refill on your cardiac medications before your next appointment, please call your pharmacy*   Lab Work: None ordered   If you have labs (blood work) drawn today and your tests are completely normal, you will receive your results only by: [place] (if you have MyChart) OR A paper copy in the mail If you have any lab test that is abnormal or we need to change your treatment, we will call you to review the results.   Testing/Procedures: None ordered   Important Information About Sugar         Signed, Angelena Form, PA-C  07/12/2021 3:15 PM    [facility]

## 2021-07-12 ENCOUNTER — Ambulatory Visit: Payer: BC Managed Care – PPO | Admitting: Physician Assistant

## 2021-07-12 ENCOUNTER — Ambulatory Visit (HOSPITAL_COMMUNITY): Payer: BC Managed Care – PPO | Attending: Cardiovascular Disease

## 2021-07-12 VITALS — BP 84/42 | HR 55 | Ht 69.0 in | Wt 144.0 lb

## 2021-07-12 DIAGNOSIS — M2140 Flat foot [pes planus] (acquired), unspecified foot: Secondary | ICD-10-CM

## 2021-07-12 DIAGNOSIS — Q2112 Patent foramen ovale: Secondary | ICD-10-CM | POA: Diagnosis not present

## 2021-07-12 DIAGNOSIS — Z8774 Personal history of (corrected) congenital malformations of heart and circulatory system: Secondary | ICD-10-CM

## 2021-07-12 DIAGNOSIS — I639 Cerebral infarction, unspecified: Secondary | ICD-10-CM

## 2021-07-12 DIAGNOSIS — R002 Palpitations: Secondary | ICD-10-CM

## 2021-07-12 DIAGNOSIS — I6389 Other cerebral infarction: Secondary | ICD-10-CM

## 2021-07-12 DIAGNOSIS — F4323 Adjustment disorder with mixed anxiety and depressed mood: Secondary | ICD-10-CM | POA: Diagnosis not present

## 2021-07-12 DIAGNOSIS — I10 Essential (primary) hypertension: Secondary | ICD-10-CM

## 2021-07-12 LAB — ECHOCARDIOGRAM LIMITED BUBBLE STUDY
Area-P 1/2: 2.59 cm2
S' Lateral: 2.3 cm

## 2021-07-12 NOTE — Patient Instructions (Signed)
Medication Instructions:  Your physician recommends that you continue on your current medications as directed. Please refer to the Current Medication list given to you today.  *If you need a refill on your cardiac medications before your next appointment, please call your pharmacy*   Lab Work: None ordered   If you have labs (blood work) drawn today and your tests are completely normal, you will receive your results only by: [place] (if you have MyChart) OR A paper copy in the mail If you have any lab test that is abnormal or we need to change your treatment, we will call you to review the results.   Testing/Procedures: None ordered   Important Information About Sugar

## 2021-07-13 DIAGNOSIS — Z7984 Long term (current) use of oral hypoglycemic drugs: Secondary | ICD-10-CM | POA: Diagnosis not present

## 2021-07-13 DIAGNOSIS — M24571 Contracture, right ankle: Secondary | ICD-10-CM | POA: Diagnosis not present

## 2021-07-13 DIAGNOSIS — E785 Hyperlipidemia, unspecified: Secondary | ICD-10-CM | POA: Diagnosis not present

## 2021-07-13 DIAGNOSIS — I10 Essential (primary) hypertension: Secondary | ICD-10-CM | POA: Diagnosis not present

## 2021-07-13 DIAGNOSIS — M21071 Valgus deformity, not elsewhere classified, right ankle: Secondary | ICD-10-CM | POA: Diagnosis not present

## 2021-07-13 DIAGNOSIS — G8918 Other acute postprocedural pain: Secondary | ICD-10-CM | POA: Diagnosis not present

## 2021-07-13 DIAGNOSIS — R262 Difficulty in walking, not elsewhere classified: Secondary | ICD-10-CM | POA: Diagnosis not present

## 2021-07-13 DIAGNOSIS — S93691A Other sprain of right foot, initial encounter: Secondary | ICD-10-CM | POA: Diagnosis not present

## 2021-07-13 DIAGNOSIS — S86111A Strain of other muscle(s) and tendon(s) of posterior muscle group at lower leg level, right leg, initial encounter: Secondary | ICD-10-CM | POA: Diagnosis not present

## 2021-07-13 DIAGNOSIS — M2141 Flat foot [pes planus] (acquired), right foot: Secondary | ICD-10-CM | POA: Diagnosis not present

## 2021-07-13 DIAGNOSIS — M79671 Pain in right foot: Secondary | ICD-10-CM | POA: Diagnosis not present

## 2021-07-13 DIAGNOSIS — M76821 Posterior tibial tendinitis, right leg: Secondary | ICD-10-CM | POA: Diagnosis not present

## 2021-07-13 DIAGNOSIS — M216X1 Other acquired deformities of right foot: Secondary | ICD-10-CM | POA: Diagnosis not present

## 2021-07-13 DIAGNOSIS — G629 Polyneuropathy, unspecified: Secondary | ICD-10-CM | POA: Diagnosis not present

## 2021-07-13 DIAGNOSIS — Z8673 Personal history of transient ischemic attack (TIA), and cerebral infarction without residual deficits: Secondary | ICD-10-CM | POA: Diagnosis not present

## 2021-07-13 DIAGNOSIS — Z79899 Other long term (current) drug therapy: Secondary | ICD-10-CM | POA: Diagnosis not present

## 2021-07-13 DIAGNOSIS — X58XXXA Exposure to other specified factors, initial encounter: Secondary | ICD-10-CM | POA: Diagnosis not present

## 2021-07-13 DIAGNOSIS — Z802 Family history of malignant neoplasm of other respiratory and intrathoracic organs: Secondary | ICD-10-CM | POA: Diagnosis not present

## 2021-07-13 DIAGNOSIS — M25374 Other instability, right foot: Secondary | ICD-10-CM | POA: Diagnosis not present

## 2021-07-26 DIAGNOSIS — F4323 Adjustment disorder with mixed anxiety and depressed mood: Secondary | ICD-10-CM | POA: Diagnosis not present

## 2021-07-31 DIAGNOSIS — M216X1 Other acquired deformities of right foot: Secondary | ICD-10-CM | POA: Diagnosis not present

## 2021-07-31 DIAGNOSIS — S86111A Strain of other muscle(s) and tendon(s) of posterior muscle group at lower leg level, right leg, initial encounter: Secondary | ICD-10-CM | POA: Diagnosis not present

## 2021-07-31 DIAGNOSIS — S93691A Other sprain of right foot, initial encounter: Secondary | ICD-10-CM | POA: Diagnosis not present

## 2021-07-31 DIAGNOSIS — M76821 Posterior tibial tendinitis, right leg: Secondary | ICD-10-CM | POA: Diagnosis not present

## 2021-08-07 DIAGNOSIS — F4323 Adjustment disorder with mixed anxiety and depressed mood: Secondary | ICD-10-CM | POA: Diagnosis not present

## 2021-08-14 DIAGNOSIS — M7989 Other specified soft tissue disorders: Secondary | ICD-10-CM | POA: Diagnosis not present

## 2021-08-23 DIAGNOSIS — F4323 Adjustment disorder with mixed anxiety and depressed mood: Secondary | ICD-10-CM | POA: Diagnosis not present

## 2021-08-28 DIAGNOSIS — X58XXXD Exposure to other specified factors, subsequent encounter: Secondary | ICD-10-CM | POA: Diagnosis not present

## 2021-08-28 DIAGNOSIS — S86111D Strain of other muscle(s) and tendon(s) of posterior muscle group at lower leg level, right leg, subsequent encounter: Secondary | ICD-10-CM | POA: Diagnosis not present

## 2021-08-28 DIAGNOSIS — M7989 Other specified soft tissue disorders: Secondary | ICD-10-CM | POA: Diagnosis not present

## 2021-08-28 NOTE — Progress Notes (Signed)
Patient ID: Cody Henson, male    DOB: 1975-03-15  MRN: [id number]  CC: Chronic Care Management   Subjective: Cody Henson is a 46 y.o. male who presents for chronic care management.   His concerns today include:  Hypertension follow-up: 05/12/2021: - Continue Lisinopril as prescribed.  - Patient due for annual Cardiology appointment June 2023.   07/12/2021 [facility] per PA note: HTN: continue Lisinopril. BP 84/42. Patient thinks he is dehydrated today. Watches BP at home. I told him if it continues to be in the 73U or 20U sytolic, we should stop his lisinopril    09/04/2021: Doing well on current regimen, no issues/concerns. Home blood pressure similar to today's reading.  2. Hyperlipidemia follow-up: 05/12/2021: - Continue Atorvastatin as prescribed.   09/04/2021: Challenges swallowing tablet due to size. We discussed to try placing in spoon of yogurt or applesauce to see if that helps.  Patient Active Problem List   Diagnosis Date Noted   PFO (patent foramen ovale) 06/22/2020   Transaminitis    Hemiparesis affecting dominant side as late effect of stroke (HCC)    Blood pressure increase diastolic    Left middle cerebral artery stroke ([place]) 03/23/2020   Urinary retention    Dyslipidemia    Benign essential HTN    Acute ischemic stroke ([place])    Global aphasia    Depression    Hyperglycemia    Right hemiparesis (HCC)    Aphasia    Elevated blood pressure reading    Stroke (cerebrum) ([place]) 03/17/2020   Enthesopathy of hip region on both sides 01/26/2019   Chronic right shoulder pain 12/10/2014   Scapular dyskinesis 07/29/2014   Bursitis of right shoulder 03/01/2014     Current Outpatient Medications on File Prior to Visit  Medication Sig Dispense Refill   amphetamine-dextroamphetamine (ADDERALL) 20 MG tablet Take 10 mg by mouth 3 (three) times daily as needed.     aspirin EC 81 MG tablet Take 81 mg by mouth daily.     hydrocortisone 2.5 %  cream Apply to aa's face QD 3d/wk. 90 g 3   ketoconazole (NIZORAL) 2 % shampoo Shampoo into the face, scalp, chest, and buttocks let sit 5-10 minutes then wash off. Use 3d/wk. 120 mL 6   metFORMIN (GLUCOPHAGE) 1000 MG tablet Take 1,000 mg by mouth daily.     Multiple Vitamin (MULTIVITAMIN) tablet Take 1 tablet by mouth daily.     sertraline (ZOLOFT) 100 MG tablet Take 150 mg by mouth every morning.     No current facility-administered medications on file prior to visit.    Allergies  Allergen Reactions   Egg White (Egg Protein) Itching   Other Hives    Chicken meat, [place]   Eggs Or Egg-Derived Products Itching    Social History   Socioeconomic History   Marital status: Married    Spouse name: Not on file   Number of children: Not on file   Years of education: Not on file   Highest education level: Not on file  Occupational History   Not on file  Tobacco Use   Smoking status: Never    Passive exposure: Never   Smokeless tobacco: Never  Substance and Sexual Activity   Alcohol use: Not on file   Drug use: Not on file   Sexual activity: Not on file  Other Topics Concern   Not on file  Social History Narrative   Not on file   Social Determinants of  Health   Financial Resource Strain: Not on file  Food Insecurity: Not on file  Transportation Needs: Not on file  Physical Activity: Not on file  Stress: Not on file  Social Connections: Not on file  Intimate Partner Violence: Not on file    Family History  Problem Relation Age of Onset   Stroke Paternal Grandfather    Stroke Maternal Aunt     Past Surgical History:  Procedure Laterality Date   BUBBLE STUDY  03/21/2020   Procedure: BUBBLE STUDY;  Surgeon: [name], MD;  Location: [facility] ENDOSCOPY;  Service: Cardiology;;   PATENT FORAMEN OVALE(PFO) CLOSURE N/A 06/22/2020   Procedure: PATENT FORAMEN OVALE (PFO) CLOSURE;  Surgeon: Sherren Mocha, MD;  Location: [place] CV LAB;  Service: Cardiovascular;   Laterality: N/A;   TEE WITHOUT CARDIOVERSION N/A 03/21/2020   Procedure: TRANSESOPHAGEAL ECHOCARDIOGRAM (TEE);  Surgeon: [name], MD;  Location: [place];  Service: Cardiology;  Laterality: N/A;    ROS: Review of Systems Negative except as stated above  PHYSICAL EXAM: BP 120/81 (BP Location: Left Arm, Patient Position: Sitting, Cuff Size: Normal)   Pulse 90   Temp 98.3 F (36.8 C)   Resp 18   Ht 5' 9.02" (1.753 m)   Wt 144 lb (65.3 kg)   SpO2 98%   BMI 21.26 kg/m    Physical Exam HENT:     Head: Normocephalic and atraumatic.  Eyes:     Extraocular Movements: Extraocular movements intact.     Conjunctiva/sclera: Conjunctivae normal.     Pupils: Pupils are equal, round, and reactive to light.  Cardiovascular:     Rate and Rhythm: Normal rate and regular rhythm.     Pulses: Normal pulses.     Heart sounds: Normal heart sounds.  Pulmonary:     Effort: Pulmonary effort is normal.     Breath sounds: Normal breath sounds.  Musculoskeletal:     Cervical back: Normal range of motion and neck supple.  Neurological:     General: No focal deficit present.     Mental Status: He is alert and oriented to person, place, and time.  Psychiatric:        Mood and Affect: Mood normal.        Behavior: Behavior normal.     ASSESSMENT AND PLAN: 1. Essential (primary) hypertension - Patient had annual Cardiology appointment on 07/12/2021 at [facility]. - Continue Lisinopril as prescribed.  - Counseled on blood pressure goal of less than 130/80, low-sodium, DASH diet, medication compliance, and 150 minutes of moderate intensity exercise per week as tolerated. Counseled on medication adherence and adverse effects. - Follow-up with primary provider in 4 months or sooner if needed.  - lisinopril (ZESTRIL) 10 MG tablet; Take 1 tablet (10 mg total) by mouth daily.  Dispense: 30 tablet; Refill: 3  2. Hyperlipidemia, unspecified hyperlipidemia type -  Continue Atorvastatin as prescribed.  - Follow-up with primary provider as scheduled.  - atorvastatin (LIPITOR) 80 MG tablet; Take 1 tablet (80 mg total) by mouth daily.  Dispense: 30 tablet; Refill: 3    Patient was given the opportunity to ask questions.  Patient verbalized understanding of the plan and was able to repeat key elements of the plan. Patient was given clear instructions to go to Emergency Department or return to medical center if symptoms don't improve, worsen, or new problems develop.The patient verbalized understanding.   Requested Prescriptions   Signed Prescriptions Disp Refills   lisinopril (ZESTRIL)  10 MG tablet 30 tablet 3    Sig: Take 1 tablet (10 mg total) by mouth daily.   atorvastatin (LIPITOR) 80 MG tablet 30 tablet 3    Sig: Take 1 tablet (80 mg total) by mouth daily.    Return in about 4 months (around 01/05/2022) for Follow-Up or next available chronic care mgmt.  Camillia Herter, NP

## 2021-09-04 ENCOUNTER — Ambulatory Visit (INDEPENDENT_AMBULATORY_CARE_PROVIDER_SITE_OTHER): Payer: BC Managed Care – PPO | Admitting: Family

## 2021-09-04 VITALS — BP 120/81 | HR 90 | Temp 98.3°F | Resp 18 | Ht 69.02 in | Wt 144.0 lb

## 2021-09-04 DIAGNOSIS — E785 Hyperlipidemia, unspecified: Secondary | ICD-10-CM | POA: Diagnosis not present

## 2021-09-04 DIAGNOSIS — I10 Essential (primary) hypertension: Secondary | ICD-10-CM

## 2021-09-04 MED ORDER — ATORVASTATIN CALCIUM 80 MG PO TABS: 80.0000 mg | ORAL_TABLET | Freq: Every day | ORAL | 3 refills | Status: DC

## 2021-09-04 MED ORDER — LISINOPRIL 10 MG PO TABS: 10.0000 mg | ORAL_TABLET | Freq: Every day | ORAL | 3 refills | Status: DC

## 2021-09-04 NOTE — Progress Notes (Signed)
Pt presents for chronic care management f/u

## 2021-09-15 DIAGNOSIS — S86111A Strain of other muscle(s) and tendon(s) of posterior muscle group at lower leg level, right leg, initial encounter: Secondary | ICD-10-CM | POA: Diagnosis not present

## 2021-09-18 ENCOUNTER — Emergency Department: Payer: BC Managed Care – PPO

## 2021-09-18 ENCOUNTER — Emergency Department
Admission: EM | Admit: 2021-09-18 | Discharge: 2021-09-18 | Disposition: A | Discharge status: Home / Self Care | Payer: BC Managed Care – PPO | Attending: Emergency Medicine | Admitting: Emergency Medicine

## 2021-09-18 ENCOUNTER — Other Ambulatory Visit: Payer: Self-pay

## 2021-09-18 DIAGNOSIS — S0990XA Unspecified injury of head, initial encounter: Secondary | ICD-10-CM | POA: Diagnosis not present

## 2021-09-18 DIAGNOSIS — Z79899 Other long term (current) drug therapy: Secondary | ICD-10-CM | POA: Insufficient documentation

## 2021-09-18 DIAGNOSIS — W19XXXA Unspecified fall, initial encounter: Secondary | ICD-10-CM | POA: Insufficient documentation

## 2021-09-18 DIAGNOSIS — S99921A Unspecified injury of right foot, initial encounter: Secondary | ICD-10-CM | POA: Diagnosis not present

## 2021-09-18 DIAGNOSIS — R55 Syncope and collapse: Secondary | ICD-10-CM | POA: Diagnosis not present

## 2021-09-18 DIAGNOSIS — I10 Essential (primary) hypertension: Secondary | ICD-10-CM | POA: Insufficient documentation

## 2021-09-18 DIAGNOSIS — Z043 Encounter for examination and observation following other accident: Secondary | ICD-10-CM | POA: Diagnosis not present

## 2021-09-18 DIAGNOSIS — Z7982 Long term (current) use of aspirin: Secondary | ICD-10-CM | POA: Insufficient documentation

## 2021-09-18 DIAGNOSIS — S8991XA Unspecified injury of right lower leg, initial encounter: Secondary | ICD-10-CM | POA: Diagnosis not present

## 2021-09-18 DIAGNOSIS — I63512 Cerebral infarction due to unspecified occlusion or stenosis of left middle cerebral artery: Secondary | ICD-10-CM | POA: Diagnosis not present

## 2021-09-18 LAB — CBC WITH DIFFERENTIAL/PLATELET
Abs Immature Granulocytes: 0.03 10*3/uL (ref 0.00–0.07)
Basophils Absolute: 0.1 10*3/uL (ref 0.0–0.1)
Basophils Relative: 1 %
Eosinophils Absolute: 0.3 10*3/uL (ref 0.0–0.5)
Eosinophils Relative: 3 %
HCT: 47.8 % (ref 39.0–52.0)
Hemoglobin: 15.7 g/dL (ref 13.0–17.0)
Immature Granulocytes: 0 %
Lymphocytes Relative: 28 %
Lymphs Abs: 2.5 10*3/uL (ref 0.7–4.0)
MCH: 28.3 pg (ref 26.0–34.0)
MCHC: 32.8 g/dL (ref 30.0–36.0)
MCV: 86.1 fL (ref 80.0–100.0)
Monocytes Absolute: 0.7 10*3/uL (ref 0.1–1.0)
Monocytes Relative: 8 %
Neutro Abs: 5.4 10*3/uL (ref 1.7–7.7)
Neutrophils Relative %: 60 %
Platelets: 281 10*3/uL (ref 150–400)
RBC: 5.55 MIL/uL (ref 4.22–5.81)
RDW: 12.8 % (ref 11.5–15.5)
WBC: 9 10*3/uL (ref 4.0–10.5)
nRBC: 0 % (ref 0.0–0.2)

## 2021-09-18 LAB — COMPREHENSIVE METABOLIC PANEL
ALT: 17 U/L (ref 0–44)
AST: 29 U/L (ref 15–41)
Albumin: 4.7 g/dL (ref 3.5–5.0)
Alkaline Phosphatase: 81 U/L (ref 38–126)
Anion gap: 11 (ref 5–15)
BUN: 21 mg/dL — ABNORMAL HIGH (ref 6–20)
CO2: 23 mmol/L (ref 22–32)
Calcium: 9.5 mg/dL (ref 8.9–10.3)
Chloride: 109 mmol/L (ref 98–111)
Creatinine, Ser: 1 mg/dL (ref 0.61–1.24)
GFR, Estimated: >60 mL/min (ref >60)
Glucose, Bld: 138 mg/dL — ABNORMAL HIGH (ref 70–99)
Potassium: 3.8 mmol/L (ref 3.5–5.1)
Sodium: 143 mmol/L (ref 135–145)
Total Bilirubin: 0.6 mg/dL (ref 0.3–1.2)
Total Protein: 7.8 g/dL (ref 6.5–8.1)

## 2021-09-18 LAB — TROPONIN I (HIGH SENSITIVITY): Troponin I (High Sensitivity): 4 ng/L (ref <18)

## 2021-09-18 MED ORDER — ACETAMINOPHEN 500 MG PO TABS
1000.0000 mg | ORAL_TABLET | Freq: Once | ORAL | Status: AC
  Administered 2021-09-18: 1000 mg via ORAL
  Filled 2021-09-18: qty 2

## 2021-09-18 NOTE — Discharge Instructions (Signed)
You may take Tylenol 1000 mg every 6 hours as needed for pain.  Your blood work and EKG today are reassuring.  CT head and x-ray showed no traumatic injury.

## 2021-09-18 NOTE — ED Notes (Signed)
Patient transported to X-ray 

## 2021-09-18 NOTE — ED Triage Notes (Signed)
Pt states he "passed out" yesterday afternoon after he did not consume po fluids or eat yesterday. Pt states he fell backwards and struck his head resulting in laceration to posterior scalp and possibly injuring right foot. Pt denies chest pain and shob before fainting.

## 2021-09-18 NOTE — ED Provider Notes (Addendum)
[facility] Provider Note    Event Date/Time   First MD Initiated Contact with Patient 09/18/21 0406     (approximate)   History   Head Injury   HPI  Cody Henson is a 46 y.o. male with history of hypertension, hyperlipidemia, previous stroke with right-sided deficits who presents to the emergency department with head injury, right leg injury after he had a syncopal event yesterday afternoon.  He denies any preceding symptoms prior to passing out but states he thinks it was due to not eating and drinking that day because he was "working on something".  States he hit his head on the [place].  Has a small laceration that is well approximated.  Last tetanus vaccine was in 2020.  He states he has some right foot, ankle and knee pain as well as tailbone pain.  No neck or back pain.  He is on aspirin but no other antiplatelet or anticoagulant.  States he talk to his mother after the event and she was concerned given reports of previous celebrities who have died after head injury and recommended he come to the emergency department.  He denies any preceding chest pain or shortness of breath that led to his syncopal event.   History provided by patient.    Past Medical History:  Diagnosis Date   Allergy    Phreesia 04/10/2020   Depressed    Hyperlipidemia    Phreesia 04/10/2020   Hypertension    Phreesia 04/10/2020   Neuromuscular disorder ([place])    Phreesia 04/10/2020   Stroke [facility])    Phreesia 04/10/2020    Past Surgical History:  Procedure Laterality Date   BUBBLE STUDY  03/21/2020   Procedure: BUBBLE STUDY;  Surgeon: [name], MD;  Location: [facility] ENDOSCOPY;  Service: Cardiology;;   PATENT FORAMEN OVALE(PFO) CLOSURE N/A 06/22/2020   Procedure: PATENT FORAMEN OVALE (PFO) CLOSURE;  Surgeon: Sherren Mocha, MD;  Location: [place] CV LAB;  Service: Cardiovascular;  Laterality: N/A;   TEE WITHOUT CARDIOVERSION N/A 03/21/2020   Procedure:  TRANSESOPHAGEAL ECHOCARDIOGRAM (TEE);  Surgeon: [name], MD;  Location: [place];  Service: Cardiology;  Laterality: N/A;    MEDICATIONS:  Prior to Admission medications   Medication Sig Start Date End Date Taking? Authorizing Provider  amphetamine-dextroamphetamine (ADDERALL) 20 MG tablet Take 10 mg by mouth 3 (three) times daily as needed. 08/31/21   [provider]  aspirin EC 81 MG tablet Take 81 mg by mouth daily. 03/23/20   [provider]  atorvastatin (LIPITOR) 80 MG tablet Take 1 tablet (80 mg total) by mouth daily. 09/04/21 01/02/22  Camillia Herter, NP  hydrocortisone 2.5 % cream Apply to aa's face QD 3d/wk. 01/30/21   Ralene Bathe, MD  ketoconazole (NIZORAL) 2 % shampoo Shampoo into the face, scalp, chest, and buttocks let sit 5-10 minutes then wash off. Use 3d/wk. 01/30/21   Ralene Bathe, MD  lisinopril (ZESTRIL) 10 MG tablet Take 1 tablet (10 mg total) by mouth daily. 09/04/21 01/02/22  Camillia Herter, NP  metFORMIN (GLUCOPHAGE) 1000 MG tablet Take 1,000 mg by mouth daily.    [provider]  Multiple Vitamin (MULTIVITAMIN) tablet Take 1 tablet by mouth daily.    [provider]  sertraline (ZOLOFT) 100 MG tablet Take 150 mg by mouth every morning. 05/28/21   [provider]    Physical Exam   Triage Vital Signs: ED Triage Vitals  Enc Vitals Group  BP 09/18/21 0332 140/86     Pulse Rate 09/18/21 0332 90     Resp 09/18/21 0332 16     Temp 09/18/21 0332 98.4 F (36.9 C)     Temp Source 09/18/21 0332 Oral     SpO2 09/18/21 0332 98 %     Weight 09/18/21 0333 143 lb 4.8 oz (65 kg)     Height 09/18/21 0333 '5\' 9"'$  (1.753 m)     Head Circumference --      Peak Flow --      Pain Score 09/18/21 0332 2     Pain Loc --      Pain Edu? --      Excl. in [place]? --     Most recent vital signs: Vitals:   09/18/21 0332 09/18/21 0515  BP: 140/86 113/71  Pulse: 94 83  Resp: 20 16  Temp: 98.4 F (36.9 C) 98.4 F  (36.9 C)  SpO2: 93% 97%     CONSTITUTIONAL: Alert and oriented and responds appropriately to questions. Well-appearing; well-nourished; GCS 15 HEAD: Normocephalic; 2 cm superficial well approximated laceration to the posterior scalp with small associated hematoma with no active bleeding or surrounding redness or warmth EYES: Conjunctivae clear, PERRL, EOMI ENT: normal nose; no rhinorrhea; moist mucous membranes; pharynx without lesions noted; no dental injury; no septal hematoma, no epistaxis; no facial deformity or bony tenderness NECK: Supple, no midline spinal tenderness, step-off or deformity; trachea midline CARD: RRR; S1 and S2 appreciated; no murmurs, no clicks, no rubs, no gallops RESP: Normal chest excursion without splinting or tachypnea; breath sounds clear and equal bilaterally; no wheezes, no rhonchi, no rales; no hypoxia or respiratory distress CHEST:  chest wall stable, no crepitus or ecchymosis or deformity, nontender to palpation; no flail chest ABD/GI: Normal bowel sounds; non-distended; soft, non-tender, no rebound, no guarding; no ecchymosis or other lesions noted PELVIS:  stable, nontender to palpation BACK:  The back appears normal; no midline spinal tenderness, step-off or deformity EXT: Patient's right foot and ankle are in a surgical boot.  Patient's boot is removed, patient reports some tenderness over the dorsal foot and ankle without deformity.  Recently had surgery to his ankle and his incisions are completely healed.  No redness or warmth, ecchymosis or significant soft tissue swelling.  Does have some bruising to the medial right knee without effusion or deformity.  Full range of motion in all joints.  He does have some tenderness palpation over the coccyx.  No bruising to this area.  Compartments soft. SKIN: Normal color for age and race; warm NEURO: No facial asymmetry, speech is slow in cadence but otherwise normal, he is able to move all extremities equally,  reports chronic diminished sensation in the right side compared to the left  ED Results / Procedures / Treatments   LABS: (all labs ordered are listed, but only abnormal results are displayed) Labs Reviewed  COMPREHENSIVE METABOLIC PANEL - Abnormal; Notable for the following components:      Result Value   Glucose, Bld 138 (*)    BUN 21 (*)    All other components within normal limits  CBC WITH DIFFERENTIAL/PLATELET  TROPONIN I (HIGH SENSITIVITY)     EKG:    Date: 09/18/2021 3:40 AM  Rate: 97  Rhythm: normal sinus rhythm  QRS Axis: normal  Intervals: normal  ST/T Wave abnormalities: normal  Conduction Disutrbances: none  Narrative Interpretation: Incomplete right bundle branch block, no significant change from January 2022  RADIOLOGY: My personal review and interpretation of imaging: CT head shows no acute abnormality.  X-rays show no fracture or dislocation.  Head CT shows no skull fracture or intracranial hemorrhage.  I have personally reviewed all radiology reports. DG Sacrum/Coccyx  Result Date: 09/18/2021 CLINICAL DATA:  Fall yesterday EXAM: SACRUM AND COCCYX - 2+ VIEW COMPARISON:  None Available. FINDINGS: There is no evidence of fracture or other focal bone lesions. IMPRESSION: Negative. Electronically Signed   By: Jorje Guild M.D.   On: 09/18/2021 05:37   DG Knee Complete 4 Views Right  Result Date: 09/18/2021 CLINICAL DATA:  Fall yesterday. EXAM: RIGHT KNEE - COMPLETE 4+ VIEW COMPARISON:  None Available. FINDINGS: No evidence of fracture, dislocation, or joint effusion. No evidence of arthropathy or other focal bone abnormality. Soft tissues are unremarkable. IMPRESSION: Negative. Electronically Signed   By: Jorje Guild M.D.   On: 09/18/2021 05:36   DG Ankle Complete Right  Result Date: 09/18/2021 CLINICAL DATA:  Fall yesterday EXAM: RIGHT ANKLE - COMPLETE 3+ VIEW COMPARISON:  None Available. FINDINGS: Prior calcaneus surgery with screws traversing  an osteotomy or fracture lucency. Chronic/reactive ossification at the medial malleolus. No acute fracture or dislocation. IMPRESSION: No acute finding. Electronically Signed   By: Jorje Guild M.D.   On: 09/18/2021 05:35   DG Foot Complete Right  Result Date: 09/18/2021 CLINICAL DATA:  Right foot injury. EXAM: RIGHT FOOT COMPLETE - 3+ VIEW COMPARISON:  None Available. FINDINGS: Soft tissues are unremarkable. There is a chronic oblique fracture of the body of the calcaneus with 2 threaded ORIF screws. There is a wedge-shaped metallic structure imbedded in the dorsal first cuneiform bone without evidence of hardware loosening. No acute fracture is evident. There is mild narrowing and trace spurring of the first MTP joint, otherwise, arthritic changes are not seen elsewhere. IMPRESSION: Old surgical changes. No acute fracture is evident. Chronic fixated calcaneal fracture. Electronically Signed   By: Telford Nab M.D.   On: 09/18/2021 04:40   CT HEAD WO CONTRAST (5MM)  Result Date: 09/18/2021 CLINICAL DATA:  Trauma. EXAM: CT HEAD WITHOUT CONTRAST TECHNIQUE: Contiguous axial images were obtained from the base of the skull through the vertex without intravenous contrast. RADIATION DOSE REDUCTION: This exam was performed according to the departmental dose-optimization program which includes automated exposure control, adjustment of the mA and/or kV according to patient size and/or use of iterative reconstruction technique. COMPARISON:  Head CT dated 03/17/2020. FINDINGS: Brain: Left MCA territory multifocal old infarcts and encephalomalacia. There is no acute intracranial hemorrhage. No mass effect or midline shift. No extra-axial fluid collection. Vascular: No hyperdense vessel or unexpected calcification. Skull: Normal. Negative for fracture or focal lesion. Sinuses/Orbits: No acute finding. Other: None IMPRESSION: 1. No acute intracranial pathology. 2. Left MCA territory multifocal old infarcts and  encephalomalacia. Electronically Signed   By: Anner Crete M.D.   On: 09/18/2021 03:54     PROCEDURES:  Critical Care performed: No     .1-3 Lead EKG Interpretation  Performed by: Tierre Netto, Delice Bison, DO Authorized by: Zarinah Oviatt, Delice Bison, DO     Interpretation: normal     ECG rate:  94   ECG rate assessment: normal     Rhythm: sinus rhythm     Ectopy: none     Conduction: normal       IMPRESSION / MDM / ASSESSMENT AND PLAN / ED COURSE  I reviewed the triage vital signs and the nursing notes.  Patient here after a syncopal event  over 12 hours ago.  Did have a head injury and is complaining of right leg and coccyx pain.  The patient is on the cardiac monitor to evaluate for evidence of arrhythmia and/or significant heart rate changes.   DIFFERENTIAL DIAGNOSIS (includes but not limited to):   Syncope secondary to dehydration, hypoglycemia, ACS, arrhythmia, less likely PE or dissection.  Differential also includes skull fracture, intracranial hemorrhage, concussion, extremity fracture dislocation, contusions He is at risk for PE and DVT given recent surgery and that he is in a walking boot but he is ambulatory without calf tenderness or calf swelling, chest pain or shortness of breath, tachycardia, tachypnea or hypoxia.   Patient's presentation is most consistent with acute presentation with potential threat to life or bodily function.  PLAN: We will obtain CBC, BMP, troponin x1, CT head, x-rays of the right lower extremity and coccyx.  Will give Tylenol for pain.  He does not want anything stronger at this time.  Tetanus vaccine is up-to-date.  There are no wounds that need repair at this time.   MEDICATIONS GIVEN IN ED: Medications  acetaminophen (TYLENOL) tablet 1,000 mg (1,000 mg Oral Given 09/18/21 0433)     ED COURSE: Patient's labs are reassuring.  Normal hemoglobin, electrolytes, renal function, glucose, negative troponin.  CT head and x-rays of the right lower  extremity, coccyx and sacrum reviewed and interpreted by myself and the radiologist and show no acute traumatic injury.  Patient remains hemodynamically stable without cardiac events seen on cardiac monitoring and neurologically intact.  Will discharge home with head injury return precautions and supportive care instructions.   At this time, I do not feel there is any life-threatening condition present. I reviewed all nursing notes, vitals, pertinent previous records.  All lab and urine results, EKGs, imaging ordered have been independently reviewed and interpreted by myself.  I reviewed all available radiology reports from any imaging ordered this visit.  Based on my assessment, I feel the patient is safe to be discharged home without further emergent workup and can continue workup as an outpatient as needed. Discussed all findings, treatment plan as well as usual and customary return precautions.  They verbalize understanding and are comfortable with this plan.  Outpatient follow-up has been provided as needed.  All questions have been answered.    CONSULTS: Admission considered but syncope likely secondary to dehydration, possible transient hypoglycemia over 12 hours ago with no acute traumatic injury seen on imaging today.  Cardiac work-up reassuring.   OUTSIDE RECORDS REVIEWED: Reviewed patient's recent orthopedic note at [facility] on 09/15/2021.  Patient had surgery for traumatic rupture of the right posterior tibial tendon.       FINAL CLINICAL IMPRESSION(S) / ED DIAGNOSES   Final diagnoses:  Syncope, unspecified syncope type  Injury of head, initial encounter     Rx / DC Orders   ED Discharge Orders     None        Note:  This document was prepared using Dragon voice recognition software and may include unintentional dictation errors.   Juleah [place], Delice Bison, DO 09/18/21 [place], Delice Bison, DO 09/18/21 [phone]

## 2021-09-20 DIAGNOSIS — F4323 Adjustment disorder with mixed anxiety and depressed mood: Secondary | ICD-10-CM | POA: Diagnosis not present

## 2021-10-04 DIAGNOSIS — F4323 Adjustment disorder with mixed anxiety and depressed mood: Secondary | ICD-10-CM | POA: Diagnosis not present

## 2021-10-06 DIAGNOSIS — X509XXD Other and unspecified overexertion or strenuous movements or postures, subsequent encounter: Secondary | ICD-10-CM | POA: Diagnosis not present

## 2021-10-06 DIAGNOSIS — S86111D Strain of other muscle(s) and tendon(s) of posterior muscle group at lower leg level, right leg, subsequent encounter: Secondary | ICD-10-CM | POA: Diagnosis not present

## 2021-10-18 DIAGNOSIS — F4323 Adjustment disorder with mixed anxiety and depressed mood: Secondary | ICD-10-CM | POA: Diagnosis not present

## 2021-11-01 DIAGNOSIS — F4323 Adjustment disorder with mixed anxiety and depressed mood: Secondary | ICD-10-CM | POA: Diagnosis not present

## 2021-11-10 DIAGNOSIS — M19071 Primary osteoarthritis, right ankle and foot: Secondary | ICD-10-CM | POA: Diagnosis not present

## 2021-11-10 DIAGNOSIS — X500XXA Overexertion from strenuous movement or load, initial encounter: Secondary | ICD-10-CM | POA: Diagnosis not present

## 2021-11-10 DIAGNOSIS — M7989 Other specified soft tissue disorders: Secondary | ICD-10-CM | POA: Diagnosis not present

## 2021-11-10 DIAGNOSIS — S86111A Strain of other muscle(s) and tendon(s) of posterior muscle group at lower leg level, right leg, initial encounter: Secondary | ICD-10-CM | POA: Diagnosis not present

## 2021-11-13 DIAGNOSIS — M25571 Pain in right ankle and joints of right foot: Secondary | ICD-10-CM | POA: Diagnosis not present

## 2021-11-13 DIAGNOSIS — Z4789 Encounter for other orthopedic aftercare: Secondary | ICD-10-CM | POA: Diagnosis not present

## 2021-11-13 DIAGNOSIS — M6281 Muscle weakness (generalized): Secondary | ICD-10-CM | POA: Diagnosis not present

## 2021-11-15 ENCOUNTER — Other Ambulatory Visit: Payer: Self-pay | Admitting: Family

## 2021-11-15 DIAGNOSIS — E785 Hyperlipidemia, unspecified: Secondary | ICD-10-CM

## 2021-11-15 DIAGNOSIS — I10 Essential (primary) hypertension: Secondary | ICD-10-CM

## 2021-11-22 DIAGNOSIS — Z4789 Encounter for other orthopedic aftercare: Secondary | ICD-10-CM | POA: Diagnosis not present

## 2021-11-22 DIAGNOSIS — M6281 Muscle weakness (generalized): Secondary | ICD-10-CM | POA: Diagnosis not present

## 2021-11-22 DIAGNOSIS — M25571 Pain in right ankle and joints of right foot: Secondary | ICD-10-CM | POA: Diagnosis not present

## 2021-11-29 DIAGNOSIS — F4323 Adjustment disorder with mixed anxiety and depressed mood: Secondary | ICD-10-CM | POA: Diagnosis not present

## 2021-12-04 DIAGNOSIS — M6281 Muscle weakness (generalized): Secondary | ICD-10-CM | POA: Diagnosis not present

## 2021-12-04 DIAGNOSIS — Z4789 Encounter for other orthopedic aftercare: Secondary | ICD-10-CM | POA: Diagnosis not present

## 2021-12-04 DIAGNOSIS — M25571 Pain in right ankle and joints of right foot: Secondary | ICD-10-CM | POA: Diagnosis not present

## 2021-12-13 DIAGNOSIS — F4323 Adjustment disorder with mixed anxiety and depressed mood: Secondary | ICD-10-CM | POA: Diagnosis not present

## 2021-12-25 DIAGNOSIS — Z4789 Encounter for other orthopedic aftercare: Secondary | ICD-10-CM | POA: Diagnosis not present

## 2021-12-25 DIAGNOSIS — M25571 Pain in right ankle and joints of right foot: Secondary | ICD-10-CM | POA: Diagnosis not present

## 2021-12-25 DIAGNOSIS — M6281 Muscle weakness (generalized): Secondary | ICD-10-CM | POA: Diagnosis not present

## 2021-12-26 DIAGNOSIS — S93691D Other sprain of right foot, subsequent encounter: Secondary | ICD-10-CM | POA: Diagnosis not present

## 2021-12-26 DIAGNOSIS — M216X1 Other acquired deformities of right foot: Secondary | ICD-10-CM | POA: Diagnosis not present

## 2021-12-26 DIAGNOSIS — M76821 Posterior tibial tendinitis, right leg: Secondary | ICD-10-CM | POA: Diagnosis not present

## 2021-12-26 DIAGNOSIS — S86111D Strain of other muscle(s) and tendon(s) of posterior muscle group at lower leg level, right leg, subsequent encounter: Secondary | ICD-10-CM | POA: Diagnosis not present

## 2021-12-26 NOTE — Progress Notes (Signed)
Patient ID: Cody Henson, male    DOB: 08/13/1975  MRN: [id number]  CC: Chronic Care Management   Subjective: Cody Henson is a 46 y.o. male who presents for chronic care management.   His concerns today include:  Doing well on blood pressure and cholesterol medications no issues/concerns. Requests prostate (denies related symptoms) and cholesterol labs to be checked. History of tinea faciale. He was a patient of [facility]. Prefers to establish with a new office and needs a new referral for the same.   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)    Global aphasia    Depression    Hyperglycemia    Right hemiparesis (HCC)    Aphasia    Elevated blood pressure reading    Stroke (cerebrum) ([place]) 03/17/2020   Enthesopathy of hip region on both sides 01/26/2019   Chronic right shoulder pain 12/10/2014   Scapular dyskinesis 07/29/2014   Bursitis of right shoulder 03/01/2014     Current Outpatient Medications on File Prior to Visit  Medication Sig Dispense Refill   amphetamine-dextroamphetamine (ADDERALL) 10 MG tablet Take 10 mg by mouth 3 (three) times daily as needed.     ARIPiprazole (ABILIFY) 5 MG tablet Take 2.5 mg by mouth daily.     clonazePAM (KLONOPIN) 0.5 MG tablet Take 0.5 mg by mouth 2 (two) times daily as needed.     tadalafil (CIALIS) 10 MG tablet Take 10 mg by mouth as needed.     aspirin EC 81 MG tablet Take 81 mg by mouth daily.     hydrocortisone 2.5 % cream Apply to aa's face QD 3d/wk. 90 g 3   ketoconazole (NIZORAL) 2 % shampoo Shampoo into the face, scalp, chest, and buttocks let sit 5-10 minutes then wash off. Use 3d/wk. 120 mL 6    metFORMIN (GLUCOPHAGE) 1000 MG tablet Take 1,000 mg by mouth daily.     Multiple Vitamin (MULTIVITAMIN) tablet Take 1 tablet by mouth daily.     sertraline (ZOLOFT) 100 MG tablet Take 150 mg by mouth every morning.     No current facility-administered medications on file prior to visit.    Allergies  Allergen Reactions   Cephalosporins    Egg White (Egg Protein) Itching   Other Hives    Chicken meat, [place]   Eggs Or Egg-Derived Products Itching    Social History   Socioeconomic History   Marital status: Married    Spouse name: Not on file   Number of children: Not on file   Years of education: Not on file   Highest education level: Not on file  Occupational History   Not on file  Tobacco Use   Smoking status: Never    Passive exposure: Never   Smokeless tobacco: Never  Substance and Sexual Activity   Alcohol use: Not on file   Drug use: Not on file   Sexual activity: Not on file  Other Topics Concern   Not on file  Social History Narrative   Not on file   Social Determinants of Health   Financial Resource Strain: Not on file  Food Insecurity: Not  on file  Transportation Needs: Not on file  Physical Activity: Not on file  Stress: Not on file  Social Connections: Not on file  Intimate Partner Violence: Not on file    Family History  Problem Relation Age of Onset   Stroke Paternal Grandfather    Stroke Maternal Aunt     Past Surgical History:  Procedure Laterality Date   BUBBLE STUDY  03/21/2020   Procedure: BUBBLE STUDY;  Surgeon: [name], MD;  Location: [facility] ENDOSCOPY;  Service: Cardiology;;   PATENT FORAMEN OVALE(PFO) CLOSURE N/A 06/22/2020   Procedure: PATENT FORAMEN OVALE (PFO) CLOSURE;  Surgeon: Sherren Mocha, MD;  Location: [place] CV LAB;  Service: Cardiovascular;  Laterality: N/A;   TEE WITHOUT CARDIOVERSION N/A 03/21/2020   Procedure: TRANSESOPHAGEAL ECHOCARDIOGRAM (TEE);  Surgeon: [name], MD;  Location: [place];   Service: Cardiology;  Laterality: N/A;    ROS: Review of Systems Negative except as stated above  PHYSICAL EXAM: BP 129/80 (BP Location: Left Arm, Patient Position: Sitting, Cuff Size: Normal)   Pulse (!) 58   Temp 98.3 F (36.8 C)   Resp 16   Ht 5' 9.02" (1.753 m)   Wt 143 lb (64.9 kg)   SpO2 98%   BMI 21.11 kg/m   Physical Exam HENT:     Head: Normocephalic and atraumatic.  Eyes:     Extraocular Movements: Extraocular movements intact.     Conjunctiva/sclera: Conjunctivae normal.     Pupils: Pupils are equal, round, and reactive to light.  Cardiovascular:     Rate and Rhythm: Bradycardia present.     Pulses: Normal pulses.     Heart sounds: Normal heart sounds.  Pulmonary:     Effort: Pulmonary effort is normal.     Breath sounds: Normal breath sounds.  Musculoskeletal:     Cervical back: Normal range of motion and neck supple.  Neurological:     General: No focal deficit present.     Mental Status: He is alert and oriented to person, place, and time.  Psychiatric:        Mood and Affect: Mood normal.        Behavior: Behavior normal.     ASSESSMENT AND PLAN: 1. Primary hypertension - Continue Lisinopril as prescribed.  - Counseled on blood pressure goal of less than 130/80, low-sodium, DASH diet, medication compliance, and 150 minutes of moderate intensity exercise per week as tolerated. Counseled on medication adherence and adverse effects. - Follow-up with primary provider in 3 months or sooner if needed.  - lisinopril (ZESTRIL) 10 MG tablet; Take 1 tablet (10 mg total) by mouth daily.  Dispense: 30 tablet; Refill: 2  2. Hyperlipidemia, unspecified hyperlipidemia type - Continue Atorvastatin as prescribed.  - Update lipid panel. - Follow-up with primary provider in 3 months or sooner if needed.  - atorvastatin (LIPITOR) 80 MG tablet; Take 1 tablet (80 mg total) by mouth daily.  Dispense: 30 tablet; Refill: 2 - Lipid Panel  3. Diabetes mellitus  screening - Routine lab. - Hemoglobin A1c  4. Thyroid disorder screen - Routine lab. - TSH  5. Screening PSA (prostate specific antigen) - Routine lab. - PSA  6. Tinea faciale - Referral to Dermatology for further evaluation and management.  - Ambulatory referral to Dermatology   Patient was given the opportunity to ask questions.  Patient verbalized understanding of the plan and was able to repeat key elements of the plan. Patient was given clear instructions to go to Emergency  Department or return to medical center if symptoms don't improve, worsen, or new problems develop.The patient verbalized understanding.   Orders Placed This Encounter  Procedures   PSA   Hemoglobin A1c   TSH   Lipid Panel   Ambulatory referral to Dermatology     Requested Prescriptions   Signed Prescriptions Disp Refills   atorvastatin (LIPITOR) 80 MG tablet 30 tablet 2    Sig: Take 1 tablet (80 mg total) by mouth daily.   lisinopril (ZESTRIL) 10 MG tablet 30 tablet 2    Sig: Take 1 tablet (10 mg total) by mouth daily.    Return in about 3 months (around 04/07/2022) for Follow-Up or next available chronic care mgmt.  Camillia Herter, NP

## 2021-12-27 DIAGNOSIS — F4323 Adjustment disorder with mixed anxiety and depressed mood: Secondary | ICD-10-CM | POA: Diagnosis not present

## 2022-01-05 ENCOUNTER — Encounter: Payer: Self-pay | Admitting: Family

## 2022-01-05 ENCOUNTER — Ambulatory Visit (INDEPENDENT_AMBULATORY_CARE_PROVIDER_SITE_OTHER): Payer: BC Managed Care – PPO | Admitting: Family

## 2022-01-05 VITALS — BP 129/80 | HR 58 | Temp 98.3°F | Resp 16 | Ht 69.02 in | Wt 143.0 lb

## 2022-01-05 DIAGNOSIS — I10 Essential (primary) hypertension: Secondary | ICD-10-CM | POA: Diagnosis not present

## 2022-01-05 DIAGNOSIS — Z125 Encounter for screening for malignant neoplasm of prostate: Secondary | ICD-10-CM

## 2022-01-05 DIAGNOSIS — B358 Other dermatophytoses: Secondary | ICD-10-CM

## 2022-01-05 DIAGNOSIS — E785 Hyperlipidemia, unspecified: Secondary | ICD-10-CM

## 2022-01-05 DIAGNOSIS — Z1329 Encounter for screening for other suspected endocrine disorder: Secondary | ICD-10-CM

## 2022-01-05 DIAGNOSIS — Z131 Encounter for screening for diabetes mellitus: Secondary | ICD-10-CM

## 2022-01-05 MED ORDER — ATORVASTATIN CALCIUM 80 MG PO TABS: 80.0000 mg | ORAL_TABLET | Freq: Every day | ORAL | 2 refills | Status: DC

## 2022-01-05 MED ORDER — LISINOPRIL 10 MG PO TABS: 10.0000 mg | ORAL_TABLET | Freq: Every day | ORAL | 2 refills | Status: AC

## 2022-01-05 NOTE — Progress Notes (Signed)
.  Pt presents for chronic care management

## 2022-01-06 LAB — LIPID PANEL
Chol/HDL Ratio: 4.5 ratio (ref 0.0–5.0)
Cholesterol, Total: 179 mg/dL (ref 100–199)
HDL: 40 mg/dL (ref >39)
LDL Chol Calc (NIH): 108 mg/dL — ABNORMAL HIGH (ref 0–99)
Triglycerides: 177 mg/dL — ABNORMAL HIGH (ref 0–149)
VLDL Cholesterol Cal: 31 mg/dL (ref 5–40)

## 2022-01-06 LAB — TSH: TSH: 1.67 u[IU]/mL (ref 0.450–4.500)

## 2022-01-06 LAB — HEMOGLOBIN A1C
Est. average glucose Bld gHb Est-mCnc: 103 mg/dL
Hgb A1c MFr Bld: 5.2 % (ref 4.8–5.6)

## 2022-01-06 LAB — PSA: Prostate Specific Ag, Serum: 0.6 ng/mL (ref 0.0–4.0)

## 2022-01-10 DIAGNOSIS — F4323 Adjustment disorder with mixed anxiety and depressed mood: Secondary | ICD-10-CM | POA: Diagnosis not present

## 2022-01-24 DIAGNOSIS — F4323 Adjustment disorder with mixed anxiety and depressed mood: Secondary | ICD-10-CM | POA: Diagnosis not present

## 2022-01-31 ENCOUNTER — Ambulatory Visit (INDEPENDENT_AMBULATORY_CARE_PROVIDER_SITE_OTHER): Payer: BC Managed Care – PPO | Admitting: Dermatology

## 2022-01-31 DIAGNOSIS — L578 Other skin changes due to chronic exposure to nonionizing radiation: Secondary | ICD-10-CM

## 2022-01-31 DIAGNOSIS — L821 Other seborrheic keratosis: Secondary | ICD-10-CM

## 2022-01-31 DIAGNOSIS — Z1283 Encounter for screening for malignant neoplasm of skin: Secondary | ICD-10-CM | POA: Diagnosis not present

## 2022-01-31 DIAGNOSIS — B353 Tinea pedis: Secondary | ICD-10-CM

## 2022-01-31 DIAGNOSIS — L219 Seborrheic dermatitis, unspecified: Secondary | ICD-10-CM | POA: Diagnosis not present

## 2022-01-31 DIAGNOSIS — L814 Other melanin hyperpigmentation: Secondary | ICD-10-CM

## 2022-01-31 DIAGNOSIS — Z79899 Other long term (current) drug therapy: Secondary | ICD-10-CM | POA: Diagnosis not present

## 2022-01-31 DIAGNOSIS — D229 Melanocytic nevi, unspecified: Secondary | ICD-10-CM

## 2022-01-31 MED ORDER — TERBINAFINE HCL 250 MG PO TABS: 250.0000 mg | ORAL_TABLET | Freq: Every day | ORAL | 0 refills | Status: AC

## 2022-01-31 MED ORDER — HYDROCORTISONE 2.5 % EX CREA: TOPICAL_CREAM | CUTANEOUS | 5 refills | Status: AC

## 2022-01-31 MED ORDER — KETOCONAZOLE 2 % EX SHAM: MEDICATED_SHAMPOO | CUTANEOUS | 6 refills | Status: AC

## 2022-01-31 NOTE — Progress Notes (Signed)
Follow-Up Visit   Subjective  Cody Henson is a 46 y.o. male who presents for the following: Seborrheic Dermatitis (Patient here today for 1 year follow up. Using ketoconazole 2% shampoo and HC 2.5% cream as needed to affected areas at face.). Also with feet and toenail problem. The patient presents for Total-Body Skin Exam (TBSE) for skin cancer screening and mole check.  The patient has spots, moles and lesions to be evaluated, some may be new or changing and the patient has concerns that these could be cancer.  The following portions of the chart were reviewed this encounter and updated as appropriate:   Tobacco  Allergies  Meds  Problems  Med Hx  Surg Hx  Fam Hx     Review of Systems:  No other skin or systemic complaints except as noted in HPI or Assessment and Plan.  Objective  Well appearing patient in no apparent distress; mood and affect are within normal limits.  A full examination was performed including scalp, head, eyes, ears, nose, lips, neck, chest, axillae, abdomen, back, buttocks, bilateral upper extremities, bilateral lower extremities, hands, feet, fingers, toes, fingernails, and toenails. All findings within normal limits unless otherwise noted below.  b/l feet Mild scaling and maceration web spaces and over distal and lateral soles.    Assessment & Plan  Seborrheic dermatitis Scalp  Chronic and persistent condition with duration or expected duration over one year. Condition is symptomatic / bothersome to patient. Not to goal.  Seborrheic Dermatitis  -  is a chronic persistent rash characterized by pinkness and scaling most commonly of the mid face but also can occur on the scalp (dandruff), ears; mid chest, mid back and groin.  It tends to be exacerbated by stress and cooler weather.  People who have neurologic disease may experience new onset or exacerbation of existing seborrheic dermatitis.  The condition is not curable but treatable and can be  controlled.  Continue HC 2.5% cream 2-3 times weekly to affected areas at face. Continue ketoconazole 2% shampoo 2-3 times weekly at scalp and to wash face. Let sit at scalp for 5-10 minutes before rinsing.  May also use over the counter medicated shampoos the same way as ketoconazole.   Related Medications ketoconazole (NIZORAL) 2 % shampoo Shampoo into the face, scalp, chest, and buttocks let sit 5-10 minutes then wash off. Use 3d/wk.  hydrocortisone 2.5 % cream Apply to aa's face QD 3d/wk.  Tinea pedis of both feet b/l feet  Chronic and persistent condition with duration or expected duration over one year. Condition is symptomatic / bothersome to patient. Not to goal.  Restart terbinafine '250mg'$  1 po qd x 1 more month.  Terbinafine Counseling  Terbinafine is an anti-fungal medicine that can be applied to the skin (over the counter) or taken by mouth (prescription) to treat fungal infections. The pill version is often used to treat fungal infections of the nails or scalp. While most people do not have any side effects from taking terbinafine pills, some possible side effects of the medicine can include taste changes, headache, loss of smell, vision changes, nausea, vomiting, or diarrhea.   Rare side effects can include irritation of the liver, allergic reaction, or decrease in blood counts (which may show up as not feeling well or developing an infection). If you are concerned about any of these side effects, please stop the medicine and call your doctor, or in the case of an emergency such as feeling very unwell, seek immediate medical care.  Related Medications terbinafine (LAMISIL) 250 MG tablet Take 1 tablet (250 mg total) by mouth daily.  Lentigines - Scattered tan macules - Due to sun exposure - Benign-appearing, observe - Recommend daily broad spectrum sunscreen SPF 30+ to sun-exposed areas, reapply every 2 hours as needed. - Call for any changes  Seborrheic  Keratoses - Stuck-on, waxy, tan-brown papules and/or plaques  - Benign-appearing - Discussed benign etiology and prognosis. - Observe - Call for any changes  Melanocytic Nevi - Tan-brown and/or pink-flesh-colored symmetric macules and papules - Benign appearing on exam today - Observation - Call clinic for new or changing moles - Recommend daily use of broad spectrum spf 30+ sunscreen to sun-exposed areas.   Hemangiomas - Red papules - Discussed benign nature - Observe - Call for any changes  Actinic Damage - Chronic condition, secondary to cumulative UV/sun exposure - diffuse scaly erythematous macules with underlying dyspigmentation - Recommend daily broad spectrum sunscreen SPF 30+ to sun-exposed areas, reapply every 2 hours as needed.  - Staying in the shade or wearing long sleeves, sun glasses (UVA+UVB protection) and wide brim hats (4-inch brim around the entire circumference of the hat) are also recommended for sun protection.  - Call for new or changing lesions.  Skin cancer screening performed today.  Return in about 1 year (around 02/01/2023) for Seb Derm and tinea pedis.  Graciella Belton, RMA, am acting as scribe for Sarina Ser, MD . Documentation: I have reviewed the above documentation for accuracy and completeness, and I agree with the above.  Sarina Ser, MD

## 2022-01-31 NOTE — Patient Instructions (Addendum)
Continue hydrocortisone 2.5% cream 2-3 times weekly to affected areas at face. Continue ketoconazole 2% shampoo 2-3 times weekly at scalp and to wash face. Let sit at scalp for 5-10 minutes before rinsing.  May also use over the counter medicated shampoos the same way as ketoconazole.   Restart terbinafine '250mg'$  once daily.  Terbinafine Counseling  Terbinafine is an anti-fungal medicine that can be applied to the skin (over the counter) or taken by mouth (prescription) to treat fungal infections. The pill version is often used to treat fungal infections of the nails or scalp. While most people do not have any side effects from taking terbinafine pills, some possible side effects of the medicine can include taste changes, headache, loss of smell, vision changes, nausea, vomiting, or diarrhea.   Rare side effects can include irritation of the liver, allergic reaction, or decrease in blood counts (which may show up as not feeling well or developing an infection). If you are concerned about any of these side effects, please stop the medicine and call your doctor, or in the case of an emergency such as feeling very unwell, seek immediate medical care.   Due to recent changes in healthcare laws, you may see results of your pathology and/or laboratory studies on MyChart before the doctors have had a chance to review them. We understand that in some cases there may be results that are confusing or concerning to you. Please understand that not all results are received at the same time and often the doctors may need to interpret multiple results in order to provide you with the best plan of care or course of treatment. Therefore, we ask that you please give [place] 2 business days to thoroughly review all your results before contacting the office for clarification. Should we see a critical lab result, you will be contacted sooner.   If You Need Anything After Your Visit  If you have any questions or concerns for  your doctor, please call our main line at [phone] and press option 4 to reach your doctor's medical assistant. If no one answers, please leave a voicemail as directed and we will return your call as soon as possible. Messages left after 4 pm will be answered the following business day.   You may also send [place] a message via [place]. We typically respond to MyChart messages within 1-2 business days.  For prescription refills, please ask your pharmacy to contact our office. Our fax number is [phone].  If you have an urgent issue when the clinic is closed that cannot wait until the next business day, you can page your doctor at the number below.    Please note that while we do our best to be available for urgent issues outside of office hours, we are not available 24/7.   If you have an urgent issue and are unable to reach [place], you may choose to seek medical care at your doctor's office, retail clinic, urgent care center, or emergency room.  If you have a medical emergency, please immediately call 911 or go to the emergency department.  Pager Numbers  - Dr. Nehemiah Massed: [phone]  - Dr. Laurence Ferrari: [phone]  - Dr. Nicole Kindred: [phone]  In the event of inclement weather, please call our main line at [phone] for an update on the status of any delays or closures.  Dermatology Medication Tips: Please keep the boxes that topical medications come in in order to help keep track of the instructions about where and how to use  these. Pharmacies typically print the medication instructions only on the boxes and not directly on the medication tubes.   If your medication is too expensive, please contact our office at [phone] option 4 or send [place] a message through [place].   We are unable to tell what your co-pay for medications will be in advance as this is different depending on your insurance coverage. However, we may be able to find a substitute medication at lower cost or fill out  paperwork to get insurance to cover a needed medication.   If a prior authorization is required to get your medication covered by your insurance company, please allow [place] 1-2 business days to complete this process.  Drug prices often vary depending on where the prescription is filled and some pharmacies may offer cheaper prices.  The website www.goodrx.com contains coupons for medications through different pharmacies. The prices here do not account for what the cost may be with help from insurance (it may be cheaper with your insurance), but the website can give you the price if you did not use any insurance.  - You can print the associated coupon and take it with your prescription to the pharmacy.  - You may also stop by our office during regular business hours and pick up a GoodRx coupon card.  - If you need your prescription sent electronically to a different pharmacy, notify our office through [facility] or by phone at [phone] option 4.     Si Usted Necesita Algo Despus de Su Visita  Tambin puede enviarnos un mensaje a travs de Pharmacist, community. Por lo general respondemos a los mensajes de MyChart en el transcurso de 1 a 2 das hbiles.  Para renovar recetas, por favor pida a su farmacia que se ponga en contacto con nuestra oficina. Harland Dingwall de fax es [place] [phone].  Si tiene un asunto urgente cuando la clnica est cerrada y que no puede esperar hasta el siguiente da hbil, puede llamar/localizar a su doctor(a) al nmero que aparece a continuacin.   Por favor, tenga en cuenta que aunque hacemos todo lo posible para estar disponibles para asuntos urgentes fuera del horario de [place], no estamos disponibles las 24 horas del da, los 7 das de la [place].   Si tiene un problema urgente y no puede comunicarse con nosotros, puede optar por buscar atencin mdica  en el consultorio de su doctor(a), en una clnica privada, en un centro de atencin urgente o en una sala de  emergencias.  Si tiene Engineering geologist, por favor llame inmediatamente al 911 o vaya a la sala de emergencias.  Nmeros de bper  - Dr. Nehemiah Massed: [phone]  - Dra. Moye: [phone]  - Dra. Nicole Kindred: [phone]  En caso de inclemencias del [place], por favor llame a Johnsie Kindred principal al [phone] para una actualizacin sobre el [place] de cualquier retraso o cierre.  Consejos para la medicacin en dermatologa: Por favor, guarde las cajas en las que vienen los medicamentos de uso tpico para ayudarle a seguir las instrucciones sobre dnde y cmo usarlos. Las farmacias generalmente imprimen las instrucciones del medicamento slo en las cajas y no directamente en los tubos del [place].   Si su medicamento es muy caro, por favor, pngase en contacto con Zigmund Daniel llamando al [phone] y presione la opcin 4 o envenos un mensaje a travs de Pharmacist, community.   No podemos decirle cul ser su copago por los medicamentos por adelantado ya que esto es diferente dependiendo de la  cobertura de su seguro. Sin embargo, es posible que podamos encontrar un medicamento sustituto a Electrical engineer un formulario para que el seguro cubra el medicamento que se considera necesario.   Si se requiere una autorizacin previa para que su compaa de seguros [place] su medicamento, por favor permtanos de 1 a 2 das hbiles para completar este proceso.  Los precios de los medicamentos varan con frecuencia dependiendo del Environmental consultant de dnde se surte la receta y alguna farmacias pueden ofrecer precios ms baratos.  El sitio web www.goodrx.com tiene cupones para medicamentos de Airline pilot. Los precios aqu no tienen en cuenta lo que podra costar con la ayuda del seguro (puede ser ms barato con su seguro), pero el sitio web puede darle el precio si no utiliz Research scientist (physical sciences).  - Puede imprimir el cupn correspondiente y llevarlo con su receta a la farmacia.  - Tambin puede pasar por  nuestra oficina durante el horario de atencin regular y Charity fundraiser una tarjeta de cupones de GoodRx.  - Si necesita que su receta se enve electrnicamente a una farmacia diferente, informe a nuestra oficina a travs de MyChart de [place] o por telfono llamando al [phone] y presione la opcin 4.

## 2022-02-07 DIAGNOSIS — F4323 Adjustment disorder with mixed anxiety and depressed mood: Secondary | ICD-10-CM | POA: Diagnosis not present

## 2022-02-13 ENCOUNTER — Other Ambulatory Visit: Payer: Self-pay | Admitting: Family

## 2022-02-13 DIAGNOSIS — I10 Essential (primary) hypertension: Secondary | ICD-10-CM

## 2022-02-15 ENCOUNTER — Other Ambulatory Visit: Payer: Self-pay

## 2022-02-15 ENCOUNTER — Encounter: Payer: Self-pay | Admitting: Dermatology

## 2022-02-15 DIAGNOSIS — E785 Hyperlipidemia, unspecified: Secondary | ICD-10-CM

## 2022-02-15 MED ORDER — ATORVASTATIN CALCIUM 80 MG PO TABS: 80.0000 mg | ORAL_TABLET | Freq: Every day | ORAL | 0 refills | Status: AC

## 2022-04-05 ENCOUNTER — Ambulatory Visit: Payer: Self-pay | Admitting: Family

## 2022-08-31 ENCOUNTER — Other Ambulatory Visit: Payer: Self-pay

## 2022-08-31 DIAGNOSIS — I10 Essential (primary) hypertension: Secondary | ICD-10-CM

## 2022-12-12 IMAGING — CT CT HEAD CODE STROKE
4 series · 16 of 47 positions shown, 18 images · non-contrast
Comparison: None.

CLINICAL DATA: Code stroke. Initial evaluation for acute neuro
deficit, stroke suspected.

EXAM:
CT HEAD WITHOUT CONTRAST
TECHNIQUE: Contiguous axial images were obtained from the base of the skull
through the vertex without intravenous contrast.

[Series 2: head wo · axial · 0.44mm/px · z∈[-54,+66]mm · 7 of 33 slices shown, 9 images]
[im 5/33  brain]
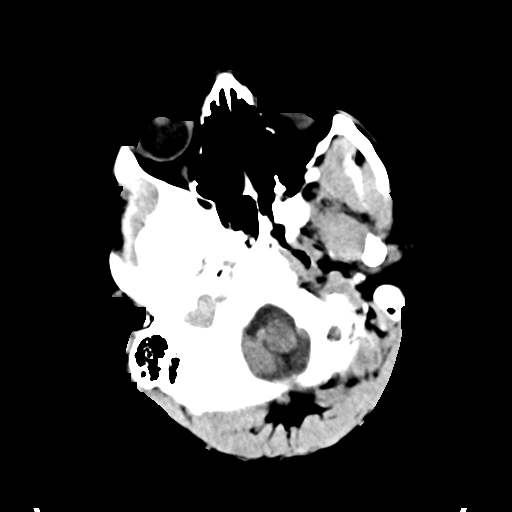
[im 5/33  bone]
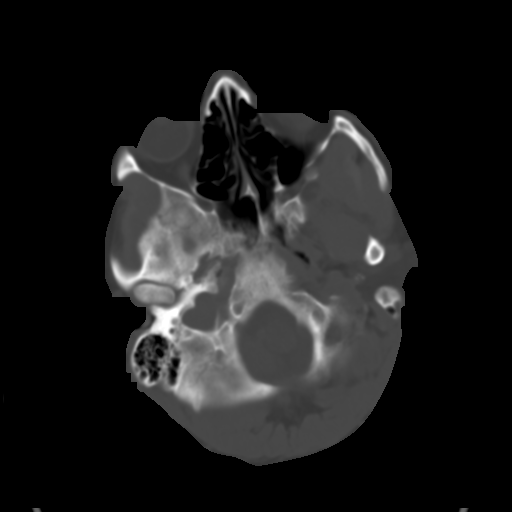
[im 9/33  brain]
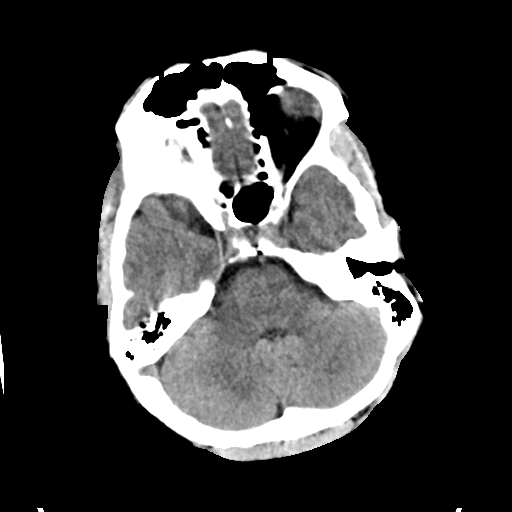
[im 13/33  brain]
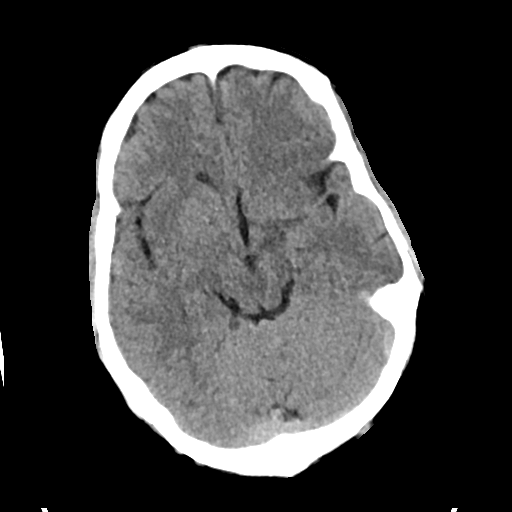
[im 17/33  brain]
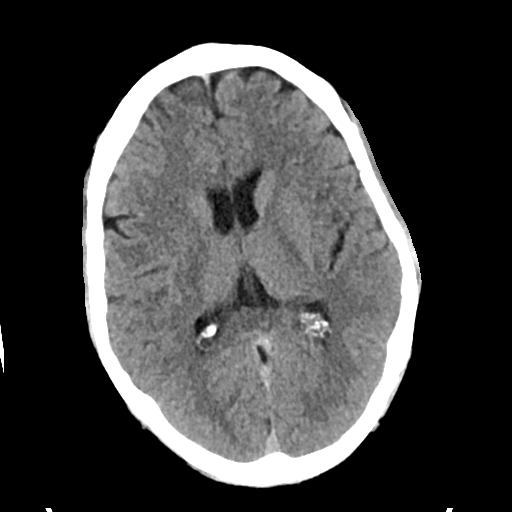
[im 21/33  brain]
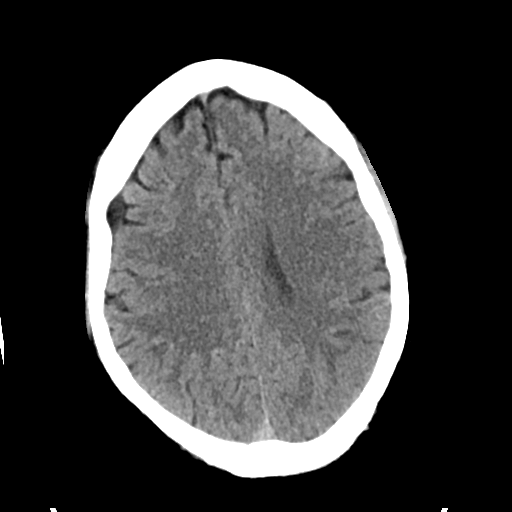
[im 21/33  bone]
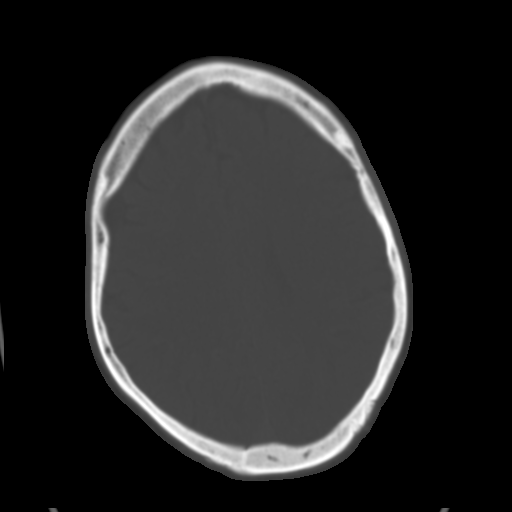
[im 25/33  brain]
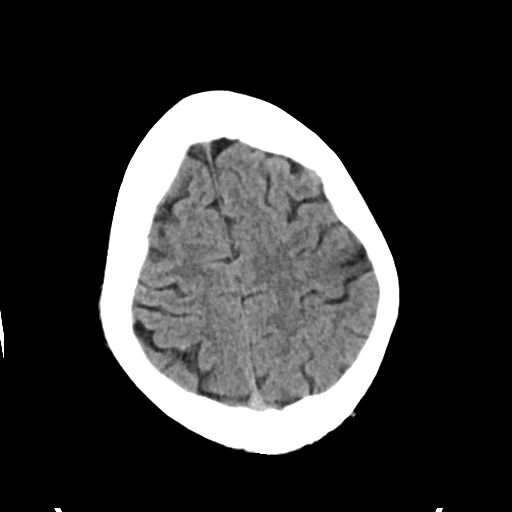
[im 29/33  brain]
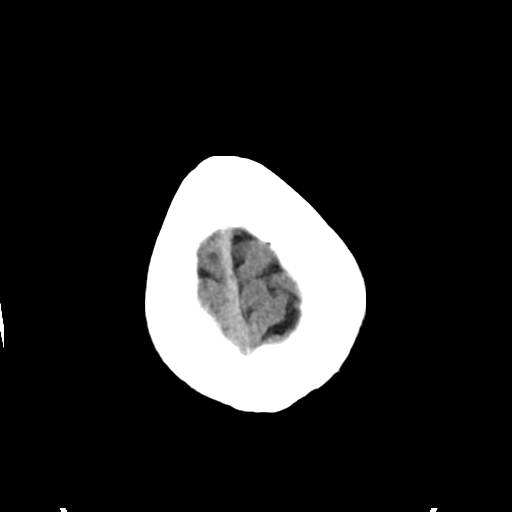

[Series 4: head bone · axial · 0.44mm/px · z∈[-58,-26]mm · 3 of 82 slices shown]
[im 9/82  bone]
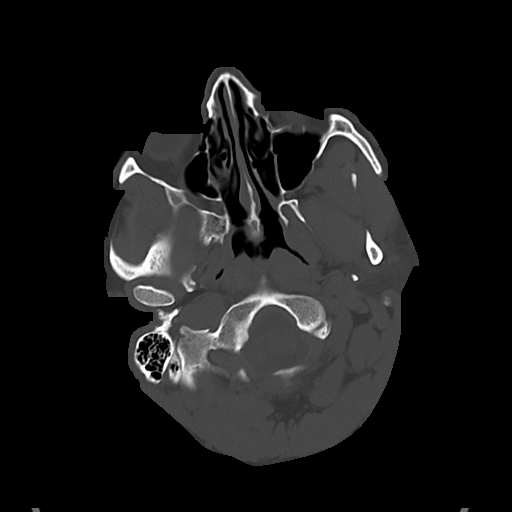
[im 17/82  bone]
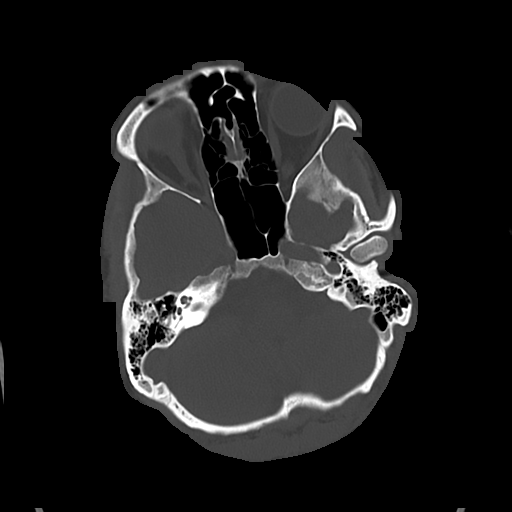
[im 25/82  bone]
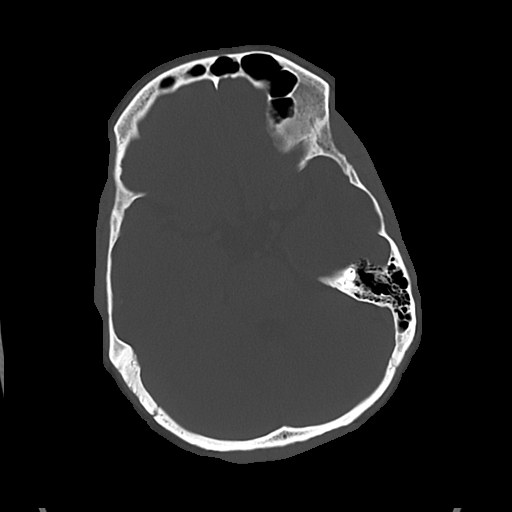

[Series 5: cor soft · coronal · 0.33mm/px · 3 of 67 slices shown]
[im 23/67  brain]
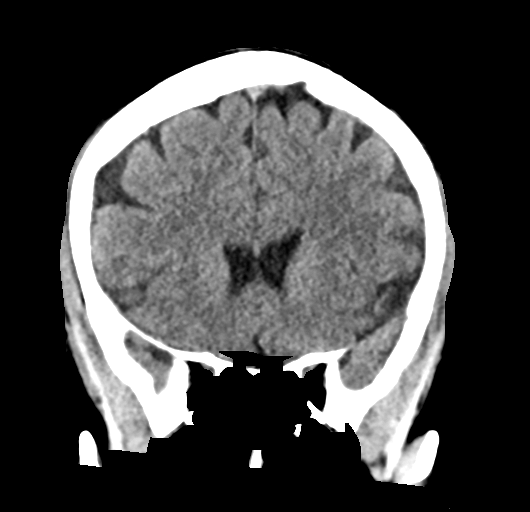
[im 30/67  brain]
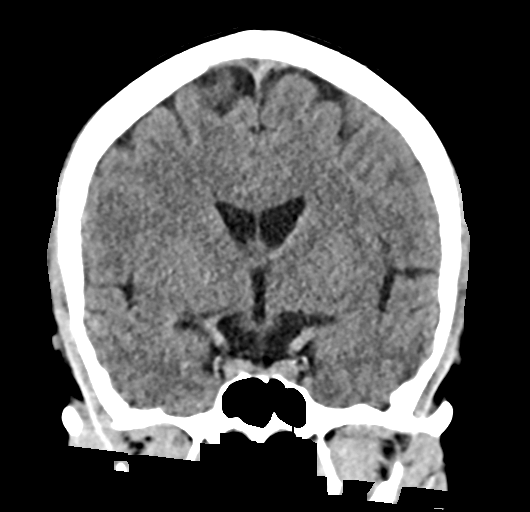
[im 37/67  brain]
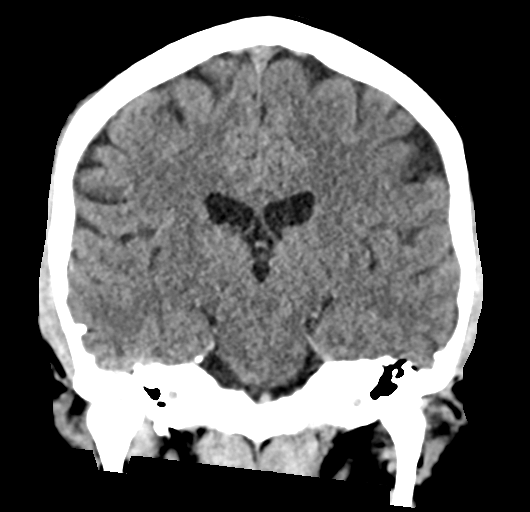

[Series 6: sag soft · sagittal · 0.33mm/px · 3 of 54 slices shown]
[im 18/54  brain]
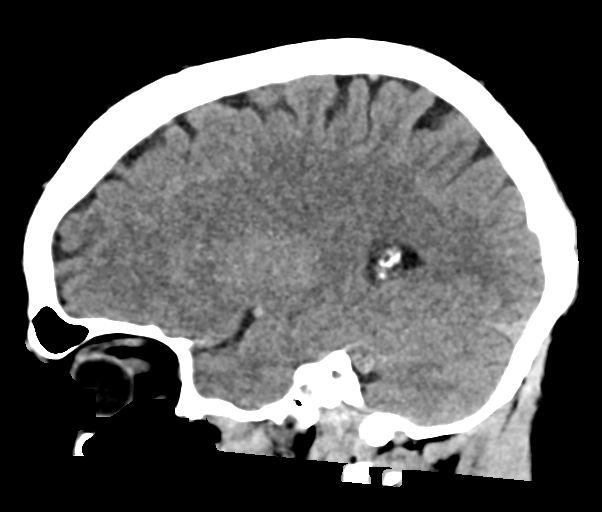
[im 27/54  brain]
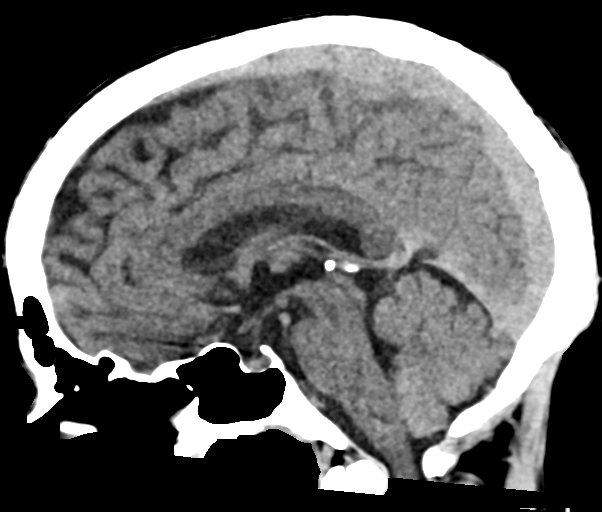
[im 36/54  brain]
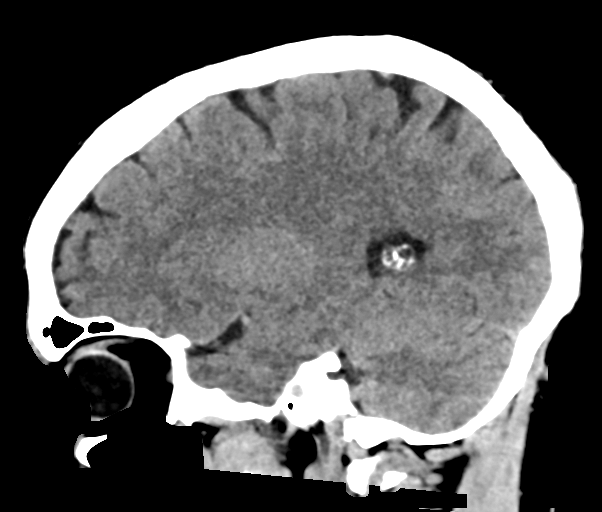

[16 of 47 positions shown; findings below may reference images not displayed]

FINDINGS: Brain: Cerebral volume within normal limits for patient age. No
acute intracranial hemorrhage. There is subtle hypodensity involving
the mid and posterior left frontal cortex with involvement of the
precentral gyrus,, suspicious for evolving acute left MCA
distribution infarct (series 2, image 25). No other definite large
vessel territory infarct. No mass lesion, midline shift or mass
effect. No hydrocephalus or extra-axial fluid collection.

Vascular: No visible hyperdense vessel.

Skull: Scalp soft tissues and calvarium within normal limits.

Sinuses/Orbits: Globes and orbital soft tissues within normal
limits. Paranasal sinuses are clear. No mastoid effusion.

Other: None.

ASPECTS (Alberta Stroke Program Early CT Score)

- Ganglionic level infarction (caudate, lentiform nuclei, internal
capsule, insula, M1-M3 cortex): 7

- Supraganglionic infarction (M4-M6 cortex): 1

Total score (0-10 with 10 being normal): 8
IMPRESSION: 1. Subtle evolving hypodensity involving the supra ganglionic mid
and posterior left frontal lobe, concerning for acute left MCA
distribution infarct. No intracranial hemorrhage.
2. ASPECTS is 8.

Critical Value/emergent results were called by telephone at the time
of interpretation on 03/16/2020 at [DATE] to provider Dr. Pourtus,
who verbally acknowledged these results.

## 2022-12-19 ENCOUNTER — Other Ambulatory Visit: Payer: Self-pay | Admitting: Dermatology

## 2022-12-19 DIAGNOSIS — B353 Tinea pedis: Secondary | ICD-10-CM

## 2023-02-06 ENCOUNTER — Ambulatory Visit: Payer: BC Managed Care – PPO | Admitting: Dermatology

## 2024-05-20 ENCOUNTER — Encounter: Payer: Self-pay | Admitting: Family
